# Patient Record
Sex: Female | Born: 1950
Health system: Southern US, Community
[De-identification: ages and names within clinical notes are randomized; demographics above are authoritative.]

## PROBLEM LIST (undated history)

## (undated) DIAGNOSIS — B009 Herpesviral infection, unspecified: Principal | ICD-10-CM

## (undated) DIAGNOSIS — E039 Hypothyroidism, unspecified: Secondary | ICD-10-CM

## (undated) DIAGNOSIS — T7840XA Allergy, unspecified, initial encounter: Secondary | ICD-10-CM

## (undated) DIAGNOSIS — M797 Fibromyalgia: Secondary | ICD-10-CM

## (undated) DIAGNOSIS — J45909 Unspecified asthma, uncomplicated: Secondary | ICD-10-CM

## (undated) DIAGNOSIS — N3010 Interstitial cystitis (chronic) without hematuria: Secondary | ICD-10-CM

## (undated) DIAGNOSIS — F419 Anxiety disorder, unspecified: Secondary | ICD-10-CM

## (undated) DIAGNOSIS — H269 Unspecified cataract: Secondary | ICD-10-CM

## (undated) DIAGNOSIS — E7800 Pure hypercholesterolemia, unspecified: Secondary | ICD-10-CM

## (undated) HISTORY — DX: Unspecified cataract: H26.9

## (undated) HISTORY — DX: Anxiety disorder, unspecified: F41.9

## (undated) HISTORY — DX: Herpesviral infection, unspecified: B00.9

## (undated) HISTORY — DX: Allergy, unspecified, initial encounter: T78.40XA

## (undated) HISTORY — DX: Fibromyalgia: M79.7

## (undated) HISTORY — DX: Hypothyroidism, unspecified: E03.9

## (undated) HISTORY — DX: Interstitial cystitis (chronic) without hematuria: N30.10

## (undated) HISTORY — DX: Pure hypercholesterolemia, unspecified: E78.00

## (undated) HISTORY — DX: Unspecified asthma, uncomplicated: J45.909

---

## 1995-06-15 HISTORY — PX: TOTAL ABDOMINAL HYSTERECTOMY: SHX209

## 1998-08-18 ENCOUNTER — Encounter: Payer: Self-pay | Admitting: Otolaryngology

## 1998-08-18 ENCOUNTER — Ambulatory Visit (HOSPITAL_COMMUNITY): Admission: RE | Admit: 1998-08-18 | Discharge: 1998-08-18 | Payer: Self-pay | Admitting: Otolaryngology

## 1999-11-17 ENCOUNTER — Encounter: Payer: Self-pay | Admitting: Obstetrics and Gynecology

## 1999-11-17 ENCOUNTER — Encounter: Admission: RE | Admit: 1999-11-17 | Discharge: 1999-11-17 | Payer: Self-pay | Admitting: Obstetrics and Gynecology

## 2000-07-27 ENCOUNTER — Ambulatory Visit (HOSPITAL_COMMUNITY): Admission: RE | Admit: 2000-07-27 | Discharge: 2000-07-27 | Payer: Self-pay | Admitting: Pulmonary Disease

## 2000-07-27 ENCOUNTER — Encounter: Payer: Self-pay | Admitting: Pulmonary Disease

## 2000-11-24 ENCOUNTER — Encounter: Payer: Self-pay | Admitting: Obstetrics and Gynecology

## 2000-11-24 ENCOUNTER — Encounter: Admission: RE | Admit: 2000-11-24 | Discharge: 2000-11-24 | Payer: Self-pay | Admitting: Obstetrics and Gynecology

## 2001-12-07 ENCOUNTER — Encounter: Payer: Self-pay | Admitting: Obstetrics and Gynecology

## 2001-12-07 ENCOUNTER — Encounter: Admission: RE | Admit: 2001-12-07 | Discharge: 2001-12-07 | Payer: Self-pay | Admitting: Obstetrics and Gynecology

## 2002-12-10 ENCOUNTER — Encounter: Payer: Self-pay | Admitting: Obstetrics and Gynecology

## 2002-12-10 ENCOUNTER — Encounter: Admission: RE | Admit: 2002-12-10 | Discharge: 2002-12-10 | Payer: Self-pay | Admitting: Obstetrics and Gynecology

## 2002-12-12 ENCOUNTER — Encounter: Admission: RE | Admit: 2002-12-12 | Discharge: 2002-12-12 | Payer: Self-pay | Admitting: Obstetrics and Gynecology

## 2002-12-12 ENCOUNTER — Encounter: Payer: Self-pay | Admitting: Obstetrics and Gynecology

## 2004-01-22 ENCOUNTER — Encounter: Admission: RE | Admit: 2004-01-22 | Discharge: 2004-01-22 | Payer: Self-pay | Admitting: Obstetrics and Gynecology

## 2004-01-27 ENCOUNTER — Encounter: Admission: RE | Admit: 2004-01-27 | Discharge: 2004-01-27 | Payer: Self-pay | Admitting: Obstetrics and Gynecology

## 2004-04-28 ENCOUNTER — Ambulatory Visit: Payer: Self-pay | Admitting: Adult Health

## 2005-02-12 ENCOUNTER — Encounter: Admission: RE | Admit: 2005-02-12 | Discharge: 2005-02-12 | Payer: Self-pay | Admitting: Obstetrics and Gynecology

## 2005-04-22 ENCOUNTER — Encounter: Admission: RE | Admit: 2005-04-22 | Discharge: 2005-04-22 | Payer: Self-pay | Admitting: Family Medicine

## 2005-07-16 ENCOUNTER — Ambulatory Visit: Payer: Self-pay | Admitting: Pulmonary Disease

## 2005-08-23 ENCOUNTER — Ambulatory Visit: Payer: Self-pay | Admitting: Pulmonary Disease

## 2005-08-24 ENCOUNTER — Encounter: Admission: RE | Admit: 2005-08-24 | Discharge: 2005-08-24 | Payer: Self-pay | Admitting: Obstetrics and Gynecology

## 2005-11-29 ENCOUNTER — Ambulatory Visit: Payer: Self-pay | Admitting: Pulmonary Disease

## 2006-01-25 ENCOUNTER — Ambulatory Visit: Payer: Self-pay | Admitting: Pulmonary Disease

## 2006-08-26 ENCOUNTER — Encounter: Admission: RE | Admit: 2006-08-26 | Discharge: 2006-08-26 | Payer: Self-pay | Admitting: Obstetrics and Gynecology

## 2006-09-08 ENCOUNTER — Ambulatory Visit: Payer: Self-pay | Admitting: Pulmonary Disease

## 2006-10-13 ENCOUNTER — Ambulatory Visit: Payer: Self-pay | Admitting: Pulmonary Disease

## 2006-10-24 ENCOUNTER — Ambulatory Visit: Payer: Self-pay | Admitting: Pulmonary Disease

## 2006-10-24 LAB — CONVERTED CEMR LAB
Albumin: 3.9 g/dL (ref 3.5–5.2)
Basophils Relative: 0.9 % (ref 0.0–1.0)
Bilirubin, Direct: 0.1 mg/dL (ref 0.0–0.3)
CO2: 31 meq/L (ref 19–32)
Cholesterol: 206 mg/dL (ref 0–200)
Direct LDL: 132.2 mg/dL
GFR calc Af Amer: 111 mL/min
Glucose, Bld: 94 mg/dL (ref 70–99)
HCT: 38.6 % (ref 36.0–46.0)
HDL: 50 mg/dL (ref >39.0)
MCHC: 33.7 g/dL (ref 30.0–36.0)
Monocytes Absolute: 0.4 10*3/uL (ref 0.2–0.7)
Neutrophils Relative %: 54.6 % (ref 43.0–77.0)
Platelets: 348 10*3/uL (ref 150–400)
RDW: 11.6 % (ref 11.5–14.6)
Sodium: 140 meq/L (ref 135–145)
Total Bilirubin: 0.5 mg/dL (ref 0.3–1.2)
Total CHOL/HDL Ratio: 4.1

## 2007-05-01 ENCOUNTER — Ambulatory Visit: Payer: Self-pay | Admitting: Pulmonary Disease

## 2007-08-01 ENCOUNTER — Ambulatory Visit: Payer: Self-pay | Admitting: Pulmonary Disease

## 2007-08-01 DIAGNOSIS — M797 Fibromyalgia: Secondary | ICD-10-CM

## 2007-08-01 DIAGNOSIS — F411 Generalized anxiety disorder: Secondary | ICD-10-CM | POA: Insufficient documentation

## 2007-08-01 DIAGNOSIS — J209 Acute bronchitis, unspecified: Secondary | ICD-10-CM

## 2007-08-01 DIAGNOSIS — N3010 Interstitial cystitis (chronic) without hematuria: Secondary | ICD-10-CM

## 2007-08-07 ENCOUNTER — Ambulatory Visit: Payer: Self-pay | Admitting: Pulmonary Disease

## 2007-08-18 ENCOUNTER — Telehealth (INDEPENDENT_AMBULATORY_CARE_PROVIDER_SITE_OTHER): Payer: Self-pay | Admitting: *Deleted

## 2007-08-18 ENCOUNTER — Ambulatory Visit: Payer: Self-pay | Admitting: Pulmonary Disease

## 2007-08-21 LAB — CONVERTED CEMR LAB
AST: 18 units/L (ref 0–37)
Albumin: 3.8 g/dL (ref 3.5–5.2)
Basophils Absolute: 0 10*3/uL (ref 0.0–0.1)
Bilirubin, Direct: 0.1 mg/dL (ref 0.0–0.3)
CO2: 34 meq/L — ABNORMAL HIGH (ref 19–32)
Chloride: 101 meq/L (ref 96–112)
Folate: >20 ng/mL
GFR calc Af Amer: 73 mL/min
GFR calc non Af Amer: 61 mL/min
Glucose, Bld: 110 mg/dL — ABNORMAL HIGH (ref 70–99)
Hemoglobin: 13.4 g/dL (ref 12.0–15.0)
MCHC: 32.5 g/dL (ref 30.0–36.0)
MCV: 93.7 fL (ref 78.0–100.0)
Potassium: 4 meq/L (ref 3.5–5.1)
RBC: 4.4 M/uL (ref 3.87–5.11)
RDW: 11.7 % (ref 11.5–14.6)
Sodium: 141 meq/L (ref 135–145)
Total Protein: 6.8 g/dL (ref 6.0–8.3)
Vitamin B-12: >1500 pg/mL — ABNORMAL HIGH (ref 211–911)
WBC: 6.7 10*3/uL (ref 4.5–10.5)

## 2007-08-25 ENCOUNTER — Ambulatory Visit: Payer: Self-pay | Admitting: Pulmonary Disease

## 2007-08-25 ENCOUNTER — Telehealth (INDEPENDENT_AMBULATORY_CARE_PROVIDER_SITE_OTHER): Payer: Self-pay | Admitting: *Deleted

## 2007-08-25 DIAGNOSIS — R9431 Abnormal electrocardiogram [ECG] [EKG]: Secondary | ICD-10-CM

## 2007-08-25 DIAGNOSIS — E039 Hypothyroidism, unspecified: Secondary | ICD-10-CM

## 2007-08-25 DIAGNOSIS — E7800 Pure hypercholesterolemia, unspecified: Secondary | ICD-10-CM

## 2007-12-19 ENCOUNTER — Ambulatory Visit (HOSPITAL_BASED_OUTPATIENT_CLINIC_OR_DEPARTMENT_OTHER): Admission: RE | Admit: 2007-12-19 | Discharge: 2007-12-19 | Payer: Self-pay | Admitting: Obstetrics and Gynecology

## 2007-12-25 ENCOUNTER — Encounter: Admission: RE | Admit: 2007-12-25 | Discharge: 2007-12-25 | Payer: Self-pay | Admitting: Obstetrics and Gynecology

## 2007-12-28 ENCOUNTER — Encounter: Admission: RE | Admit: 2007-12-28 | Discharge: 2007-12-28 | Payer: Self-pay | Admitting: Obstetrics and Gynecology

## 2008-04-05 ENCOUNTER — Ambulatory Visit: Payer: Self-pay | Admitting: Pulmonary Disease

## 2008-04-05 DIAGNOSIS — N63 Unspecified lump in unspecified breast: Secondary | ICD-10-CM | POA: Insufficient documentation

## 2008-04-15 ENCOUNTER — Encounter: Admission: RE | Admit: 2008-04-15 | Discharge: 2008-04-15 | Payer: Self-pay | Admitting: Pulmonary Disease

## 2008-08-13 ENCOUNTER — Telehealth: Payer: Self-pay | Admitting: Adult Health

## 2008-08-14 ENCOUNTER — Ambulatory Visit: Payer: Self-pay | Admitting: Pulmonary Disease

## 2008-09-05 ENCOUNTER — Ambulatory Visit: Payer: Self-pay | Admitting: Pulmonary Disease

## 2008-09-05 ENCOUNTER — Ambulatory Visit: Payer: Self-pay | Admitting: Diagnostic Radiology

## 2008-09-05 ENCOUNTER — Ambulatory Visit (HOSPITAL_BASED_OUTPATIENT_CLINIC_OR_DEPARTMENT_OTHER): Admission: RE | Admit: 2008-09-05 | Discharge: 2008-09-05 | Payer: Self-pay | Admitting: Critical Care Medicine

## 2008-11-26 ENCOUNTER — Ambulatory Visit: Payer: Self-pay | Admitting: Pulmonary Disease

## 2008-11-27 ENCOUNTER — Ambulatory Visit: Payer: Self-pay | Admitting: Pulmonary Disease

## 2008-11-27 LAB — CONVERTED CEMR LAB
Basophils Absolute: 0 10*3/uL (ref 0.0–0.1)
Basophils Relative: 0.3 % (ref 0.0–3.0)
Bilirubin Urine: NEGATIVE
Eosinophils Absolute: 0.1 10*3/uL (ref 0.0–0.7)
HCT: 39.4 % (ref 36.0–46.0)
HDL: 56.2 mg/dL (ref >39.00)
Hemoglobin, Urine: NEGATIVE
Hemoglobin: 13.5 g/dL (ref 12.0–15.0)
Leukocytes, UA: NEGATIVE
Lymphs Abs: 1.5 10*3/uL (ref 0.7–4.0)
MCHC: 34.3 g/dL (ref 30.0–36.0)
MCV: 93.2 fL (ref 78.0–100.0)
Monocytes Absolute: 0.4 10*3/uL (ref 0.1–1.0)
Neutrophils Relative %: 56.9 % (ref 43.0–77.0)
Potassium: 4.1 meq/L (ref 3.5–5.1)
RDW: 12.2 % (ref 11.5–14.6)
Sodium: 142 meq/L (ref 135–145)
Specific Gravity, Urine: 1.01 (ref 1.000–1.030)
TSH: 4.99 microintl units/mL (ref 0.35–5.50)
Total CHOL/HDL Ratio: 4
Urine Glucose: NEGATIVE mg/dL
VLDL: 19.8 mg/dL (ref 0.0–40.0)
pH: 8 (ref 5.0–8.0)

## 2008-12-10 ENCOUNTER — Telehealth (INDEPENDENT_AMBULATORY_CARE_PROVIDER_SITE_OTHER): Payer: Self-pay | Admitting: *Deleted

## 2008-12-27 ENCOUNTER — Telehealth (INDEPENDENT_AMBULATORY_CARE_PROVIDER_SITE_OTHER): Payer: Self-pay | Admitting: *Deleted

## 2009-01-14 ENCOUNTER — Encounter: Admission: RE | Admit: 2009-01-14 | Discharge: 2009-01-14 | Payer: Self-pay | Admitting: Obstetrics and Gynecology

## 2009-02-24 ENCOUNTER — Ambulatory Visit: Payer: Self-pay | Admitting: Pulmonary Disease

## 2009-03-12 ENCOUNTER — Telehealth: Payer: Self-pay | Admitting: Pulmonary Disease

## 2009-03-27 ENCOUNTER — Telehealth (INDEPENDENT_AMBULATORY_CARE_PROVIDER_SITE_OTHER): Payer: Self-pay | Admitting: *Deleted

## 2009-04-10 ENCOUNTER — Ambulatory Visit: Payer: Self-pay | Admitting: Pulmonary Disease

## 2009-04-23 ENCOUNTER — Ambulatory Visit: Payer: Self-pay | Admitting: Internal Medicine

## 2009-04-23 ENCOUNTER — Ambulatory Visit: Payer: Self-pay | Admitting: Pulmonary Disease

## 2009-04-25 ENCOUNTER — Telehealth: Payer: Self-pay | Admitting: Pulmonary Disease

## 2009-04-25 LAB — CONVERTED CEMR LAB
Basophils Absolute: 0 10*3/uL (ref 0.0–0.1)
Basophils Relative: 0.3 % (ref 0.0–3.0)
Chloride: 102 meq/L (ref 96–112)
Eosinophils Absolute: 0.1 10*3/uL (ref 0.0–0.7)
Eosinophils Relative: 1.4 % (ref 0.0–5.0)
GFR calc non Af Amer: 78.09 mL/min (ref >60)
Glucose, Bld: 104 mg/dL — ABNORMAL HIGH (ref 70–99)
Hemoglobin: 13.4 g/dL (ref 12.0–15.0)
Lymphs Abs: 1.6 10*3/uL (ref 0.7–4.0)
MCHC: 34.6 g/dL (ref 30.0–36.0)
Monocytes Absolute: 0.6 10*3/uL (ref 0.1–1.0)
Monocytes Relative: 10.4 % (ref 3.0–12.0)
Neutro Abs: 3.5 10*3/uL (ref 1.4–7.7)
Potassium: 4.1 meq/L (ref 3.5–5.1)
RDW: 12.5 % (ref 11.5–14.6)
Sodium: 139 meq/L (ref 135–145)
TSH: 2.99 microintl units/mL (ref 0.35–5.50)

## 2009-06-23 ENCOUNTER — Telehealth: Payer: Self-pay | Admitting: Pulmonary Disease

## 2009-07-01 ENCOUNTER — Ambulatory Visit: Payer: Self-pay | Admitting: Pulmonary Disease

## 2009-07-07 DIAGNOSIS — J309 Allergic rhinitis, unspecified: Secondary | ICD-10-CM | POA: Insufficient documentation

## 2009-07-28 ENCOUNTER — Ambulatory Visit: Payer: Self-pay | Admitting: Pulmonary Disease

## 2009-11-24 ENCOUNTER — Telehealth: Payer: Self-pay | Admitting: Pulmonary Disease

## 2009-11-25 ENCOUNTER — Ambulatory Visit: Payer: Self-pay | Admitting: Pulmonary Disease

## 2009-11-27 ENCOUNTER — Ambulatory Visit: Payer: Self-pay | Admitting: Pulmonary Disease

## 2009-11-29 LAB — CONVERTED CEMR LAB
ALT: 31 units/L (ref 0–35)
Albumin: 4.1 g/dL (ref 3.5–5.2)
Alkaline Phosphatase: 94 units/L (ref 39–117)
BUN: 24 mg/dL — ABNORMAL HIGH (ref 6–23)
Chloride: 105 meq/L (ref 96–112)
Cholesterol: 150 mg/dL (ref 0–200)
Eosinophils Absolute: 0.1 10*3/uL (ref 0.0–0.7)
Eosinophils Relative: 2.4 % (ref 0.0–5.0)
Hemoglobin: 12.9 g/dL (ref 12.0–15.0)
LDL Cholesterol: 78 mg/dL (ref 0–99)
MCHC: 34.3 g/dL (ref 30.0–36.0)
Neutro Abs: 3.2 10*3/uL (ref 1.4–7.7)
RDW: 13 % (ref 11.5–14.6)
Sodium: 143 meq/L (ref 135–145)
Total CHOL/HDL Ratio: 3
Total Protein: 6.7 g/dL (ref 6.0–8.3)
Vit D, 25-Hydroxy: 60 ng/mL (ref 30–89)

## 2010-01-26 ENCOUNTER — Encounter: Admission: RE | Admit: 2010-01-26 | Discharge: 2010-01-26 | Payer: Self-pay | Admitting: Obstetrics and Gynecology

## 2010-03-31 ENCOUNTER — Ambulatory Visit: Payer: Self-pay | Admitting: Pulmonary Disease

## 2010-04-29 ENCOUNTER — Telehealth (INDEPENDENT_AMBULATORY_CARE_PROVIDER_SITE_OTHER): Payer: Self-pay | Admitting: *Deleted

## 2010-04-30 ENCOUNTER — Ambulatory Visit: Payer: Self-pay | Admitting: Pulmonary Disease

## 2010-05-04 ENCOUNTER — Encounter: Admission: RE | Admit: 2010-05-04 | Discharge: 2010-05-04 | Payer: Self-pay | Admitting: Emergency Medicine

## 2010-05-08 ENCOUNTER — Telehealth (INDEPENDENT_AMBULATORY_CARE_PROVIDER_SITE_OTHER): Payer: Self-pay | Admitting: *Deleted

## 2010-07-04 ENCOUNTER — Encounter: Payer: Self-pay | Admitting: Obstetrics and Gynecology

## 2010-07-14 NOTE — Assessment & Plan Note (Signed)
Summary: lump in throat/ mbw   CC:  states she is feeling a lump in throat when swollowing x2wks..  History of Present Illness: The patient is a 60 -year-old white female patient of Dr. Jodelle Green who has a known history of mild asthmatic bronchitis, fibromyalgia,  2/17 seen w/ cough and green sputum... dx w/ AB, and rx w/ Omnicef & Mucinex... 2/23 seen w/ ST and nasal congestion... dx w/ sinusitis, and rx w/ more Omnicef, Pred, Veramist, etc. 3/6 seen w/ green tongue... and given Nystatin, yogurt etc...    April 05, 2008 -- Complains of sore throat, chest congestion, green sputum x4 days. Also noticed a small knot in right breast, Had mammogram and [place] in 7/09 which was neg per pt. --Mammorgram/US showed several small cysts.   August 14, 2008 --Presents for an acute office visit. Pt c/o productive cough with green phlegm and head congestion x5 days.  Throat sore-took MMW. Head very stuffy. Feel bad all over. Subjective fever. -took tylenol.  . Avelox is the only antibiotics that has worked for me. She has several events coming up.   September 05, 2008--Pt returns today w/ 2 days of nasal drainage, sore throat, hoarseness, cough, low grade temp ~98.1 (per pt). Cough mild white to yellow. Last visit w/ similar symptoms-resolved nicely w/ Avelox. Head feels lightheaded at times, no ear pain,     ~  November 27, 2008:  last seen by me 3/09 w/ refractory AB & responded to Medrol, Avelox, MMW, fluids, etc... she has seen TP on severe occas w/ sore throats, bronchitic infections, etc... overall doing reasonably well and here for a CPX today... she denies CP/ palpit/ SOB/ dizzy/ syncope/ edema/ etc...  February 24, 2009 --Presents for an acute office visit. Complains of sore throat, prod cough with thick greenish mucus, wheezing, increased SOB, low grade temp x3weeks - has been treating with mucinex without much help.  04/10/09--Presents for an acute office visit. Complains that symtpoms continue to persist w/  nasal congesiton, cough, soreness in ribs with coughing, Gets some better but not completely gone, finished zpack 2 weeks ago. Denies chest pain, dyspnea, orthopnea, hemoptysis, fever, n/v/d, edema, headache .   April 25, 2009--Presents for an acute office visit. She complains that her symptoms never went totally away, still very tired and no energy. still tightness in chest, prod cough with green mucus, f/c/s, finished pred taper Friday and still feels nervous. Feels run down. muscle aches are worse. Denies chest pain, orthopnea, hemoptysis, fever, n/v/d, edema, headache.   July 01, 2009--Presents for work in visit. Complains of states she is feeling a lump in throat when swollowing x2wks.  Some nasal drainage. Feels tickle in throat. Has intermittent reflux and burping.. Denies chest pain, dyspnea, orthopnea, hemoptysis, fever, n/v/d, edema, headache,discolored drainage.   Current Medications (verified): 1)  Bayer Low Strength 81 Mg  Tbec (Aspirin) .... Take 1 By Mouth Once Daily 2)  Synthroid 50 Mcg  Tabs (Levothyroxine Sodium) .... Take 1 By Mouth Once Daily 3)  Estradiol 1 Mg  Tabs (Estradiol) .... Take /21 Tablet By Mouth Once A Day 4)  Calcium 600 600 Mg  Tabs (Calcium Carbonate) .... 3 By Mouth Once Daily 5)  Womens Multivitamin Plus  Tabs (Multiple Vitamins-Minerals) .... Take 1 Tab By Mouth Once Daily.Marland KitchenMarland Kitchen 6)  Vitamin C 500 Mg  Tabs (Ascorbic Acid) .... Take 1 By Mouth Once Daily 7)  Vitamin D 1000 Unit Caps (Cholecalciferol) .... Take 1 Cap By Mouth Once Daily.Marland KitchenMarland Kitchen  8)  Vitamin E 400 Unit  Caps (Vitamin E) .... Take 1 By Mouth Once Daily 9)  Cyclobenzaprine Hcl 10 Mg  Tabs (Cyclobenzaprine Hcl) .... Take 1 Tablet By Mouth Once A Day 10)  Crestor 10 Mg Tabs (Rosuvastatin Calcium) .... Take 1/2 Tab By Mouth At Bedtime 11)  Coq-10 100 Mg Caps (Coenzyme Q10) .... Take 1 Capsule By Mouth Once A Day 12)  Mucinex Maximum Strength 1200 Mg Xr12h-Tab (Guaifenesin) .Marland Kitchen.. 1-2 Every 12 Hours As  Needed 13)  Tessalon 200 Mg Caps (Benzonatate) .Marland Kitchen.. 1 By Mouth Three Times A Day As Needed Cough  Allergies (verified): 1)  ! Iodine 2)  ! Sulfa  Past History:  Past Medical History: Last updated: 25-Dec-2008 ASTHMATIC BRONCHITIS, ACUTE (ICD-466.0) Hx of ABNORMAL ELECTROCARDIOGRAM (ICD-794.31) HYPERCHOLESTEROLEMIA (ICD-272.0) HYPOTHYROIDISM (ICD-244.9) INTERSTITIAL CYSTITIS (ICD-595.1) FIBROMYALGIA (ICD-729.1) ANXIETY (ICD-300.00)  Family History: Last updated: 12/25/08 Father died age 60 w/ Alzheimers, stroke, CHF Mother died age 71 w/ bronchiectasis, pneumonia, CHF 2 Siblings: Brother died w/ islet cell pancreatic cancer Sister, [name], has bronchiectasis & hyperchol  Social History: Last updated: 2008/12/25 Divorced 3 Children- 39, 35, 25 Never smoked Social alcohol Engineer, site- substitute teaching now plays piano, 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 Is Patient Diabetic? No Comments Medications reviewed with patient phone number verified with pt Gweneth Dimitri RN  July 01, 2009 3:31 PM    Physical Exam  Additional Exam:   GEN: A/Ox3; pleasant , NAD HEENT:  [place]/AT, , EACs-clear, TMs-nml  NOSE-pale, w/ clear d/ch, THROAT-clear NECK:  Supple w/ fair ROM; no JVD; normal carotid impulses w/o bruits; no thyromegaly or nodules palpated; no lymphadenopathy. RESP  Clear to P & A; w/o, wheezes/ rales/ or rhonchi. CARD:  RRR, no  m/r/g   GI:   Soft & nt; nml bowel sounds; no organomegaly or masses detected. Muscu: Warm bil,  no calf tenderness edema, clubbing, pulses intact Neuro:  A/Ox3 no focal deficits     Impression & Recommendations:  Problem # 1:  ALLERGIC RHINITIS (ICD-477.9)  Flare REC:  Zyrtec 10mg  at bedtime for 2 weeks  Saline nasal rinses as needed  Mucinex DM two times a day as needed cough Sugarless candy, NO MINTS, to help stop throat clearing or coughing.  Prilosec 20mg  once daily for 2 weeks  Please contact office for sooner follow up if symptoms do not improve or worsen  The following medications were removed from the medication list:    Veramyst 27.5 Mcg/spray Susp (Fluticasone furoate) .Marland Kitchen... 1 spray each nostril two times a day    Zyrtec Allergy 10 Mg Tabs (Cetirizine hcl) .Marland Kitchen... Take 1 tab by mouth at bedtime x1week then as needed  Orders: Est. Patient Level III (16109)  Medications Added to Medication  List This Visit: 1)  Estradiol 1 Mg Tabs (Estradiol) .... Take /21 tablet by mouth once a day 2)  Alprazolam 0.25 Mg Tabs (Alprazolam) .... 1/2 to 1 once daily as needed anxiety  Complete Medication List: 1)  Bayer Low Strength 81 Mg Tbec (Aspirin) .... Take 1 by mouth once daily 2)  Synthroid 50 Mcg Tabs (Levothyroxine sodium) .... Take 1 by mouth once daily 3)  Estradiol 1 Mg Tabs (Estradiol) .... Take /21 tablet by mouth once a day 4)  Calcium 600 600 Mg Tabs (Calcium carbonate) .... 3 by mouth once daily 5)  Womens Multivitamin Plus Tabs (Multiple vitamins-minerals) .... Take 1 tab by mouth once daily.Marland KitchenMarland Kitchen 6)  Vitamin C 500 Mg Tabs (Ascorbic acid) .... Take 1 by mouth once daily 7)  Vitamin D 1000 Unit Caps (Cholecalciferol) .... Take 1 cap by mouth once daily.Marland KitchenMarland Kitchen 8)  Vitamin E 400 Unit Caps (Vitamin e) .... Take 1 by mouth once daily 9)  Cyclobenzaprine Hcl 10 Mg Tabs (Cyclobenzaprine hcl) .... Take 1 tablet by mouth once a day 10)  Crestor 10 Mg Tabs (Rosuvastatin calcium) .... Take  1/2 tab by mouth at bedtime 11)  Coq-10 100 Mg Caps (Coenzyme q10) .... Take 1 capsule by mouth once a day 12)  Mucinex Maximum Strength 1200 Mg Xr12h-tab (Guaifenesin) .Marland Kitchen.. 1-2 every 12 hours as needed 13)  Tessalon 200 Mg Caps (Benzonatate) .Marland Kitchen.. 1 by mouth three times a day as needed cough 14)  Alprazolam 0.25 Mg Tabs (Alprazolam) .... 1/2 to 1 once daily as needed anxiety  Patient Instructions: 1)  Zyrtec 10mg  at bedtime for 2 weeks  2)  Saline nasal rinses as needed  3)  Mucinex DM two times a day as needed cough 4)  Sugarless candy, NO MINTS, to help stop throat clearing or coughing.  5)  Prilosec 20mg  once daily for 2 weeks  6)  Please contact office for sooner follow up if symptoms do not improve or worsen  Prescriptions: ALPRAZOLAM 0.25 MG TABS (ALPRAZOLAM) 1/2 to 1 once daily as needed anxiety  #30 x 0   Entered and Authorized by:   Rubye Oaks NP   Signed by:   Angla Delahunt NP on 07/01/2009   Method used:   Print then Give to Patient   RxID:   [id number]

## 2010-07-14 NOTE — Progress Notes (Signed)
Summary: LEG CRAMPS  Phone Note Call from Patient   Caller: Patient Call For: Dessirae Scarola Summary of Call: CRESTOR IS CAUSING LEG CRAMP Initial call taken by: Rickard Patience,  June 23, 2009 11:17 AM  Follow-up for Phone Call        Pt c/o leg cramps since taking crestor. Pt was instructed to decrease crestor dose by half and add Coenzyme, but pt states her legs are still "killing her." Please advise. Carron Curie CMA  June 23, 2009 11:23 AM   Additional Follow-up for Phone Call Additional follow up Details #1::        per SN----if this is the problem then her only option is to stop the crestor.  thanks Randell Loop CMA  June 23, 2009 2:52 PM     Additional Follow-up for Phone Call Additional follow up Details #2::    PT aware of recs from SN.Reynaldo Minium CMA  June 23, 2009 3:11 PM

## 2010-07-14 NOTE — Assessment & Plan Note (Signed)
Summary: possible bronchitis/jd   CC:  cough and congestion for 1 week. Marland Kitchen  History of Present Illness: The patient is a 60 -year-old white female patient of Dr. Jodelle Green who has a known history of mild asthmatic bronchitis, fibromyalgia.      Jun10:  last seen by me 3/09 w/ refractory AB & responded to Medrol, Avelox, MMW, fluids, etc... she has seen TP on severe occas w/ sore throats, bronchitic infections, etc... overall doing reasonably well and here for a CPX today... she denies CP/ palpit/ SOB/ dizzy/ syncope/ edema/ etc...   ~  July 28, 2009:  she was seen by TP on several occas for upper resp tract infections and AB requiring Pred, Avelox, Mucinex, etc...  presents today w/ similar URI symptoms after exposure to grand daughter w/ the flu... pt c/o cough, congestion, feeling poorly, temp to 99.6 - all over the past 3-4 d... she states symptoms persist despite Mucinex & fluids, & that ZPak doesn't work for her- wants Avelox...   ~  November 27, 2009:  "I'm doing good" she says and credits the improvement a an immune-enhancer from the health food store- ASTRALAGUS- "it's an herb that helps the lungs & immune system" & notes no further AB exac etc... her Chol is OK on the Crestor & CoQ10;  Thyroid controlled on the Synthroid50;  bladder symptoms stable;  & FM/ Anxiety controlled w/ Flex & Alpraz...   March 31, 2010 --Presents for an acute office visit. Complains of 1 week of cough, congestion with green mucus, lots of drainage in throat, fever. OTC not helping. Feels run down. Hoarseness is starting.  Denies chest pain, dyspnea, orthopnea, hemoptysis, n/v/d, edema, headache,recent travel or antibiotics.    1   Preventive Screening-Counseling & Management  Alcohol-Tobacco     Smoking Status: never  Medications Prior to Update: 1)  Bayer Low Strength 81 Mg  Tbec (Aspirin) .... Take 1 By Mouth Once Daily 2)  Crestor 10 Mg Tabs (Rosuvastatin Calcium) .... Take 1/2 Tab By Mouth At  Bedtime 3)  Coq-10 100 Mg Caps (Coenzyme Q10) .... Take 1 Capsule By Mouth Once A Day 4)  Synthroid 50 Mcg  Tabs (Levothyroxine Sodium) .... Take 1 By Mouth Once Daily 5)  Estradiol 1 Mg  Tabs (Estradiol) .... Take /21 Tablet By Mouth Qod 6)  Calcium 600 600 Mg  Tabs (Calcium Carbonate) .... 3 By Mouth Once Daily 7)  Womens Multivitamin Plus  Tabs (Multiple Vitamins-Minerals) .... Take 1 Tab By Mouth Once Daily.Marland KitchenMarland Kitchen 8)  Vitamin C 500 Mg  Tabs (Ascorbic Acid) .... Take 1 By Mouth Once Daily 9)  Vitamin D 1000 Unit Caps (Cholecalciferol) .... Take 1 Cap By Mouth Once Daily... 10)  Vitamin E 400 Unit  Caps (Vitamin E) .... Take 1 By Mouth Once Daily 11)  Cyclobenzaprine Hcl 10 Mg  Tabs (Cyclobenzaprine Hcl) .... Take 1 Tablet By Mouth Once A Day 12)  Alprazolam 0.25 Mg Tabs (Alprazolam) .... 1/2 To 1 Once Daily As Needed Anxiety  Current Medications (verified): 1)  Bayer Low Strength 81 Mg  Tbec (Aspirin) .... Take 1 By Mouth Once Daily 2)  Crestor 10 Mg Tabs (Rosuvastatin Calcium) .... Take 1/2 Tab By Mouth At Bedtime 3)  Coq-10 100 Mg Caps (Coenzyme Q10) .... Take 1 Capsule By Mouth Once A Day 4)  Synthroid 50 Mcg  Tabs (Levothyroxine Sodium) .... Take 1 By Mouth Once Daily 5)  Estradiol 1 Mg  Tabs (Estradiol) .... Take 1/2 Tablet By Mouth  Every Other Day 6)  Calcium 600 600 Mg  Tabs (Calcium Carbonate) .... 3 By Mouth Once Daily 7)  Womens Multivitamin Plus  Tabs (Multiple Vitamins-Minerals) .... Take 1 Tab By Mouth Once Daily.Marland KitchenMarland Kitchen 8)  Vitamin C 500 Mg  Tabs (Ascorbic Acid) .... Take 1 By Mouth Once Daily 9)  Vitamin D 1000 Unit Caps (Cholecalciferol) .... Take 1 Cap By Mouth Once Daily... 10)  Vitamin E 400 Unit  Caps (Vitamin E) .... Take 1 By Mouth Once Daily 11)  Cyclobenzaprine Hcl 10 Mg  Tabs (Cyclobenzaprine Hcl) .... Take 1 Tablet By Mouth Once A Day 12)  Alprazolam 0.25 Mg Tabs (Alprazolam) .... 1/2 To 1 Once Daily As Needed Anxiety  Allergies (verified): 1)  ! Iodine 2)  !  Sulfa  Past History:  Past Medical History: Last updated: 12-14-09 ASTHMATIC BRONCHITIS, ACUTE (ICD-466.0) Hx of ABNORMAL ELECTROCARDIOGRAM (ICD-794.31) HYPERCHOLESTEROLEMIA (ICD-272.0) HYPOTHYROIDISM (ICD-244.9) INTERSTITIAL CYSTITIS (ICD-595.1) FIBROMYALGIA (ICD-729.1) ANXIETY (ICD-300.00)  Past Surgical History: Last updated: December 14, 2009 hysterectomy 1997  Family History: Last updated: Dec 14, 2009 Father died age 22 w/ Alzheimers, stroke, CHF Mother died age 53 w/ bronchiectasis, pneumonia, CHF 2 Siblings: Brother died w/ islet cell pancreatic cancer Sister, [name], has bronchiectasis & hyperchol  Social History: Last updated: 2009/12/14 Divorced 3 Children- 39, 35, 25 Never smoked Social alcohol Engineer, site- substitute teaching now plays piano, 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  Is Patient Diabetic? No Comments Medications reviewed with patient Verified contact number and pharmacy with patient Zackery Barefoot CMA  March 31, 2010 3:43 PM    Physical Exam  Additional Exam:   GEN: A/Ox3; pleasant , NAD HEENT:  [place]/AT, , EACs-clear, TMs-nml  NOSE-pale, w/ clear d/ch, THROAT-clear NECK:  Supple w/ fair ROM; no JVD; normal carotid impulses w/o bruits; no thyromegaly or nodules palpated; no lymphadenopathy. RESP  Clear to P & A; w/o, wheezes/ rales/ or rhonchi. CARD:  RRR, no m/r/g   GI:   Soft & nt; nml bowel sounds; no organomegaly or masses detected. Muscu: Warm bil,  no calf tenderness edema, clubbing, pulses intact Neuro:  A/Ox3 no focal  deficits     Impression & Recommendations:  Problem # 1:  ASTHMATIC BRONCHITIS, ACUTE (ICD-466.0)  Plan :  Avelox 400mg  once daily for 7 days Zyrtec 10mg  at bedtime for 2 weeks  Saline nasal rinses as needed  Mucinex DM two times a day as needed cough Sugarless candy, NO MINTS, to help stop throat clearing or coughing.  Prilosec 20mg  once daily for 2 weeks  Please contact office for sooner follow up if symptoms do not improve or worsen  Her updated medication list for this problem includes:    Avelox 400 Mg Tabs (Moxifloxacin hcl) .Marland Kitchen... 1 by mouth once daily  Orders: Est. Patient Level IV (57846)  Medications Added to Medication List This Visit: 1)  Estradiol 1  Mg Tabs (Estradiol) .... Take 1/2 tablet by mouth every other day 2)  Avelox 400 Mg Tabs (Moxifloxacin hcl) .Marland Kitchen.. 1 by mouth once daily 3)  Majic Mouthwash  .... 1 tsp four times a day as needed sore throat , swish and swallow  Complete Medication List: 1)  Bayer Low Strength 81 Mg Tbec (Aspirin) .... Take 1 by mouth once daily 2)  Crestor 10 Mg Tabs (Rosuvastatin calcium) .... Take 1/2 tab by mouth at bedtime 3)  Coq-10 100 Mg Caps (Coenzyme q10) .... Take 1 capsule by mouth once a day 4)  Synthroid 50 Mcg Tabs (Levothyroxine sodium) .... Take 1 by mouth once daily 5)  Estradiol 1 Mg Tabs (Estradiol) .... Take 1/2 tablet by mouth every other day 6)  Calcium 600 600 Mg Tabs (Calcium carbonate) .... 3 by mouth once daily 7)  Womens Multivitamin Plus Tabs (Multiple vitamins-minerals) .... Take 1 tab by mouth once daily.Marland KitchenMarland Kitchen 8)  Vitamin C 500 Mg Tabs (Ascorbic acid) .... Take 1 by mouth once daily 9)  Vitamin D 1000 Unit Caps (Cholecalciferol) .... Take 1 cap by mouth once daily... 10)  Vitamin E 400 Unit Caps (Vitamin e) .... Take 1 by mouth once daily 11)  Cyclobenzaprine Hcl 10 Mg Tabs (Cyclobenzaprine hcl) .... Take 1 tablet by mouth once a day 12)  Alprazolam 0.25 Mg Tabs (Alprazolam) .... 1/2 to 1 once daily as needed  anxiety 13)  Avelox 400 Mg Tabs (Moxifloxacin hcl) .Marland Kitchen.. 1 by mouth once daily 14)  Majic Mouthwash  .... 1 tsp four times a day as needed sore throat , swish and swallow  Patient Instructions: 1)  Avelox 400mg  once daily for 7 days 2)  Zyrtec 10mg  at bedtime for 2 weeks  3)  Saline nasal rinses as needed  4)  Mucinex DM two times a day as needed cough 5)  Sugarless candy, NO MINTS, to help stop throat clearing or coughing.  6)  Prilosec 20mg  once daily for 2 weeks  7)  Please contact office for sooner follow up if symptoms do not improve or worsen  Prescriptions: MAJIC MOUTHWASH 1 tsp four times a day as needed sore throat , swish and swallow  #8 oz x 0   Entered and Authorized by:   Rubye Oaks NP   Signed by:   Tammy Parrett NP on 03/31/2010   Method used:   Print then Give to Patient   RxID:   [id number] AVELOX 400 MG TABS (MOXIFLOXACIN HCL) 1 by mouth once daily  #7 x 0   Entered and Authorized by:   Rubye Oaks NP   Signed by:   Tammy Parrett NP on 03/31/2010   Method used:   Electronically to        [facility]* (retail)       [address]       [place], [place]  [address]       Ph: [phone]       Fax: [phone]   RxID:   [id number]

## 2010-07-14 NOTE — Assessment & Plan Note (Signed)
Summary: cpx/ mbw   CC:  4 month ROV & annual CPX....  History of Present Illness: 60 y/o WF here for a follow up visit & annual CPX...   ~  Jun10:  last seen by me 3/09 w/ refractory AB & responded to Medrol, Avelox, MMW, fluids, etc... she has seen TP on severe occas w/ sore throats, bronchitic infections, etc... overall doing reasonably well and here for a CPX today... she denies CP/ palpit/ SOB/ dizzy/ syncope/ edema/ etc...   ~  July 28, 2009:  she was seen by TP on several occas for upper resp tract infections and AB requiring Pred, Avelox, Mucinex, etc...  presents today w/ similar URI symptoms after exposure to grand daughter w/ the flu... pt c/o cough, congestion, feeling poorly, temp to 99.6 - all over the past 3-4 d... she states symptoms persist despite Mucinex & fluids, & that ZPak doesn't work for her- wants Avelox...   ~  November 27, 2009:  "I'm doing good" she says and credits the improvement a an immune-enhancer from the health food store- ASTRALAGUS- "it's an herb that helps the lungs & immune system" & notes no further AB exac etc... her Chol is OK on the Crestor & CoQ10;  Thyroid controlled on the Synthroid50;  bladder symptoms stable;  & FM/ Anxiety controlled w/ Flex & Alpraz...    Current Problems:   ASTHMATIC BRONCHITIS, ACUTE (ICD-466.0) - she gets 1-2 bouts of bronchitis yearly & they are "hard to shake"- requiring Medrol, Antibiotics, Mucinex, etc... prev doing well - w/o cough, sputum, hemoptysis, worsening dyspnea, wheezing, chest pains, snoring, daytime hypersomnolence, etc...   ~  6/11:  she credits improvement on an herbal supplement= Astralagus.  Hx of ABNORMAL ELECTROCARDIOGRAM (ICD-794.31) - seen by Jaclyn Bennett in 2001... she is asymptomatic and denies CP, palipit, dizziness, syncope, dyspnea, edema, etc...   ~  baseline EKG w/ poor R progression V1-3, diffuse SSTTWA...   ~  2DEcho 8/01 w/ mild ant wall HK, norm EF, valves normal...  ~  Cardiolite 8/01 showed  norm perfusion, no ischemia, EF=61%...  ~  f/u EKG remains abnormal> no change.  HYPERCHOLESTEROLEMIA (ICD-272.0) - treated w/ diet + CRESTOR 10mg - 1/2 daily + CoQ10 100mg /d...  ~  TChol as high as 228, LDL up to 160 in 2007  ~  FLP 5/08 showed TChol 206, TG 62, HDL 50, LDL 132  ~  FLP 6/10 showed TChol 225, TG 99, HDL 56, LDL 157... rec> statin Rx- she initially declined but agreed to try Crestor5mg  w/ CoQ10...  ~  FLP 6/11 showed TChol 150, TG 70, HDL 58, LDL 78  HYPOTHYROIDISM (ICD-244.9) - on SYNTHROID 60mcg/d... clinically euthyroid...  ~  TSH as high as 6.35 in 2007, started on Rx...   ~  TSH 5/08 = 4.75 on Rx...  ~  labs 6/10 showed TSH= 4.99  ~  labs 6/11 showed TSH= 3.46  *** she has repeatedly refused screening colonoscopy... discussed again & she is requested to sched this important screening procedure.  INTERSTITIAL CYSTITIS (ICD-595.1) - followed by Jaclyn Bennett & she describes 2 problems- small bladder, & not emptying completely  FIBROMYALGIA (ICD-729.1) - takes FLEXERIL 10mg Qhs...  ANXIETY (ICD-300.00) w/ hx claustraphobia & uses ALPRAZOLAM 0.25mg  Tid Prn...  HEALTH MAINTENANCE:  prev seen by Jaclyn Bennett for GYN.Marland Kitchen.  now she sees Jaclyn Bennett- yearly f/u & weaning Estradiol... [facility]/ [facility] on son[facility] last yr & f/u due soon...  she had BMD done for baseline= normal  by her report (mother had osteoporosis & she "doesn't want to get that")... she has repeatedly refused colonoscopy & refuses once again today despite long conversation about the importance of this screening procedure...    Current Medications (verified): 1)  Bayer Low Strength 81 Mg  Tbec (Aspirin) .... Take 1 By Mouth Once Daily 2)  Crestor 10 Mg Tabs (Rosuvastatin Calcium) .... Take 1/2 Tab By Mouth At Bedtime 3)  Coq-10 100 Mg Caps (Coenzyme Q10) .... Take 1 Capsule By Mouth Once A Day 4)  Synthroid 50 Mcg  Tabs (Levothyroxine Sodium) .... Take 1 By Mouth Once Daily 5)   Estradiol 1 Mg  Tabs (Estradiol) .... Take /21 Tablet By Mouth Qod 6)  Calcium 600 600 Mg  Tabs (Calcium Carbonate) .... 3 By Mouth Once Daily 7)  Womens Multivitamin Plus  Tabs (Multiple Vitamins-Minerals) .... Take 1 Tab By Mouth Once Daily.Marland KitchenMarland Kitchen 8)  Vitamin C 500 Mg  Tabs (Ascorbic Acid) .... Take 1 By Mouth Once Daily 9)  Vitamin D 1000 Unit Caps (Cholecalciferol) .... Take 1 Cap By Mouth Once Daily... 10)  Vitamin E 400 Unit  Caps (Vitamin E) .... Take 1 By Mouth Once Daily 11)  Cyclobenzaprine Hcl 10 Mg  Tabs (Cyclobenzaprine Hcl) .... Take 1 Tablet By Mouth Once A Day 12)  Alprazolam 0.25 Mg Tabs (Alprazolam) .... 1/2 To 1 Once Daily As Needed Anxiety 13)  Avelox 400 Mg Tabs (Moxifloxacin Hcl) .... Take 1 Tab By Mouth Once Daily Til Gone...  Allergies: 1)  ! Iodine 2)  ! Sulfa  Past History:  Past Medical History: ASTHMATIC BRONCHITIS, ACUTE (ICD-466.0) Hx of ABNORMAL ELECTROCARDIOGRAM (ICD-794.31) HYPERCHOLESTEROLEMIA (ICD-272.0) HYPOTHYROIDISM (ICD-244.9) INTERSTITIAL CYSTITIS (ICD-595.1) FIBROMYALGIA (ICD-729.1) ANXIETY (ICD-300.00)  Past Surgical History: hysterectomy 1997  Family History: Reviewed history from 11/27/2008 and no changes required. Father died age 28 w/ Alzheimers, stroke, CHF Mother died age 66 w/ bronchiectasis, pneumonia, CHF 2 Siblings: Brother died w/ islet cell pancreatic cancer Sister, [name], has bronchiectasis & hyperchol  Social History: Reviewed history from 11/27/2008 and no changes required. 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  The patient denies fever, chills, sweats, anorexia, fatigue, weakness, weight loss, sleep disorder, blurring, diplopia, eye irritation, eye discharge,  vision loss, eye pain, photophobia, earache, ear discharge, tinnitus, decreased hearing, nasal congestion, nosebleeds, sore throat, hoarseness, chest pain, palpitations, syncope, orthopnea, PND, peripheral edema, cough, dyspnea at rest, excessive sputum, hemoptysis, wheezing, pleurisy, nausea, vomiting, diarrhea, constipation, change in bowel habits, abdominal pain, melena, hematochezia, jaundice, indigestion/heartburn, dysphagia, odynophagia, dysuria, hematuria, urinary hesitancy, nocturia, back pain, joint pain, joint swelling, muscle weakness, stiffness, arthritis, sciatica, restless legs, leg pain at night, leg pain with exertion, rash, itching, dryness, suspicious lesions, paralysis, paresthesias, seizures, tremors, vertigo, transient blindness, frequent falls, frequent headaches, difficulty walking, depression, memory loss, confusion, cold intolerance, heat intolerance, polydipsia, polyphagia, polyuria, unusual weight change, abnormal bruising, bleeding, enlarged lymph nodes, urticaria, allergic rash, hay fever, and recurrent infections.    Vital Signs:  Patient profile:   60 year old female Height:      63 inches Weight:      114 pounds O2 Sat:      100 % on Room air Temp:     97.7 degrees F oral Pulse rate:   87 /  minute BP sitting:   112 / 68  (left arm) Cuff size:   regular  Vitals Entered By: Denna Haggard, CMA (November 27, 2009 11:26 AM)  O2 Flow:  Room air  Physical Exam  Additional Exam:  WD, WN, 60 y/o WF in NAD... GENERAL:  Alert & oriented; pleasant & cooperative... HEENT:  [place]/AT, EOM-wnl, PERRLA, Fundi-benign, EACs-clear, TMs-wnl, NOSE-sl red, THROAT-clear & wnl. NECK:  Supple w/ sl decrROM; no JVD; normal carotid impulses w/o bruits; no thyromegaly or nodules palpated; no lymphadenopathy. CHEST:  Clear to P & A; without wheezes/ rales/ or rhonchi. HEART:  Regular Rhythm; without murmurs/ rubs/ or gallops. ABDOMEN:  Soft & nontender; normal bowel sounds; no organomegaly  or masses detected. EXT: without deformities or arthritic changes; no varicose veins/ venous insuffic/ or edema. +trigger points bilat... NEURO:  CN's intact; motor testing normal; sensory testing normal; gait normal & balance OK. DERM:  No lesions noted; no rash etc...    EKG  Procedure date:  11/27/2009  Findings:      Normal sinus rhythm with rate of:  82/min... Tracing shows sl IVCD, diffuse STTWA, no change from old tracings... SN   MISC. Report  Procedure date:  11/25/2009  Findings:      Lipid Panel (LIPID)   Cholesterol               150 mg/dL                   1-610   Triglycerides             70.0 mg/dL                  9.6-045.4   HDL                       09.81 mg/dL                 >19.14   LDL Cholesterol           78 mg/dL                    7-82  CBC Platelet w/Diff (CBCD)   White Cell Count          5.7 K/uL                    4.5-10.5   Red Cell Count            4.05 Mil/uL                 3.87-5.11   Hemoglobin                12.9 g/dL                   95.6-21.3   Hematocrit                37.7 %                      36.0-46.0   MCV                       93.1 fl                     78.0-100.0   Platelet Count            284.0 K/uL  150.0-400.0   Neutrophil %              55.8 %                      43.0-77.0   Lymphocyte %              31.8 %                      12.0-46.0   Monocyte %                9.4 %                       3.0-12.0   Eosinophils%              2.4 %                       0.0-5.0   Basophils %               0.6 %                       0.0-3.0  Comments:      BMP (METABOL)   Sodium                    143 mEq/L                   135-145   Potassium                 4.0 mEq/L                   3.5-5.1   Chloride                  105 mEq/L                   96-112   Carbon Dioxide            31 mEq/L                    19-32   Glucose                   96 mg/dL                    16-10   BUN                  [H]  24  mg/dL                    9-60   Creatinine                0.9 mg/dL                   4.5-4.0   Calcium                   9.1 mg/dL                   9.8-11.9   GFR                       72.67 mL/min                >60  Hepatic/Liver Function Panel (HEPATIC)  Total Bilirubin           0.5 mg/dL                   2.9-5.6   Direct Bilirubin          0.1 mg/dL                   2.1-3.0   Alkaline Phosphatase      94 U/L                      39-117   AST                       29 U/L                      0-37   ALT                       31 U/L                      0-35   Total Protein             6.7 g/dL                    8.6-5.7   Albumin                   4.1 g/dL                    8.4-6.9  TSH (TSH)   FastTSH                   3.46 uIU/mL                 0.35-5.50  Vitamin D (25-Hydroxy) (62952)  Vitamin D (25-Hydroxy)                             60 ng/mL                    30-89   Impression & Recommendations:  Problem # 1:  PHYSICAL EXAMINATION (ICD-V70.0)  Orders: 12 Lead EKG (12 Lead EKG)  Problem # 2:  ASTHMATIC BRONCHITIS, ACUTE (ICD-466.0) Doing well w/o exac since she's started the herb ASTRALAGUS... The following medications were removed from the medication list:    Mucinex Maximum Strength 1200 Mg Xr12h-tab (Guaifenesin) .Marland Kitchen... 1-2 every 12 hours as needed    Tessalon 200 Mg Caps (Benzonatate) .Marland Kitchen... 1 by mouth three times a day as needed cough    Avelox 400 Mg Tabs (Moxifloxacin hcl) .Marland Kitchen... Take 1 tab by mouth once daily til gone...  Problem # 3:  Hx of ABNORMAL ELECTROCARDIOGRAM (ICD-794.31) EKG w/o change and she is essentially asymptomatic...  Problem # 4:  HYPERCHOLESTEROLEMIA (ICD-272.0) FLP looks good on Crestor 5mg /d... Her updated medication list for this problem includes:    Crestor 10 Mg Tabs (Rosuvastatin calcium) .Marland Kitchen... Take 1/2 tab by mouth at bedtime  Problem # 5:  HYPOTHYROIDISM (ICD-244.9) Stable on Synth50... Her updated medication list for this  problem includes:    Synthroid 50 Mcg Tabs (Levothyroxine sodium) .Marland Kitchen... Take 1 by mouth once daily  Problem # 6:  INTERSTITIAL CYSTITIS (ICD-595.1) She is followed by Jaclyn Bennett & stable...  Problem # 7:  FIBROMYALGIA (ICD-729.1) Stable on  the herbs and Flexeril... Her updated medication list for this problem includes:    Bayer Low Strength 81 Mg Tbec (Aspirin) .Marland Kitchen... Take 1 by mouth once daily    Cyclobenzaprine Hcl 10 Mg Tabs (Cyclobenzaprine hcl) .Marland Kitchen... Take 1 tablet by mouth once a day  Problem # 8:  ANXIETY (ICD-300.00) Stable on the alpraz... Her updated medication list for this problem includes:    Alprazolam 0.25 Mg Tabs (Alprazolam) .Marland Kitchen... 1/2 to 1 once daily as needed anxiety  Complete Medication List: 1)  Bayer Low Strength 81 Mg Tbec (Aspirin) .... Take 1 by mouth once daily 2)  Crestor 10 Mg Tabs (Rosuvastatin calcium) .... Take 1/2 tab by mouth at bedtime 3)  Coq-10 100 Mg Caps (Coenzyme q10) .... Take 1 capsule by mouth once a day 4)  Synthroid 50 Mcg Tabs (Levothyroxine sodium) .... Take 1 by mouth once daily 5)  Estradiol 1 Mg Tabs (Estradiol) .... Take /21 tablet by mouth qod 6)  Calcium 600 600 Mg Tabs (Calcium carbonate) .... 3 by mouth once daily 7)  Womens Multivitamin Plus Tabs (Multiple vitamins-minerals) .... Take 1 tab by mouth once daily.Marland KitchenMarland Kitchen 8)  Vitamin C 500 Mg Tabs (Ascorbic acid) .... Take 1 by mouth once daily 9)  Vitamin D 1000 Unit Caps (Cholecalciferol) .... Take 1 cap by mouth once daily... 10)  Vitamin E 400 Unit Caps (Vitamin e) .... Take 1 by mouth once daily 11)  Cyclobenzaprine Hcl 10 Mg Tabs (Cyclobenzaprine hcl) .... Take 1 tablet by mouth once a day 12)  Alprazolam 0.25 Mg Tabs (Alprazolam) .... 1/2 to 1 once daily as needed anxiety  Patient Instructions: 1)  Today we updated your med list- see below.... 2)  We refilled your meds per request... 3)  Today we reviewed your recent lab work & everything looks good... 4)  Call for any  problems.Marland KitchenMarland Kitchen 5)  Please schedule a follow-up appointment in 1 year. Prescriptions: CYCLOBENZAPRINE HCL 10 MG  TABS (CYCLOBENZAPRINE HCL) Take 1 tablet by mouth once a day  #90 x 4   Entered and Authorized by:   Michele Mcalpine MD   Signed by:   Michele Mcalpine MD on 11/27/2009   Method used:   Print then Give to Patient   RxID:   [id number] SYNTHROID 50 MCG  TABS (LEVOTHYROXINE SODIUM) take 1 by mouth once daily  #90 x 4   Entered and Authorized by:   Michele Mcalpine MD   Signed by:   Michele Mcalpine MD on 11/27/2009   Method used:   Print then Give to Patient   RxID:   [id number] CRESTOR 10 MG TABS (ROSUVASTATIN CALCIUM) Take 1/2 tab by mouth at bedtime  #30 x 11   Entered and Authorized by:   Michele Mcalpine MD   Signed by:   Michele Mcalpine MD on 11/27/2009   Method used:   Print then Give to Patient   RxID:   [id number]   Prevention & Chronic Care Immunizations   Influenza vaccine: Not documented    Tetanus booster: Not documented    Pneumococcal vaccine: Not documented  Colorectal Screening   Hemoccult: Not documented    Colonoscopy: Not documented  Other Screening   Pap smear: Not documented    Mammogram: BI-RADS CATEGORY 2:  Benign finding(s).^MM DIGITAL DIAGNOSTIC UNILAT R  (04/15/2008)   Smoking status: never  (08/01/2007)  Lipids   Total Cholesterol: 225  (11/26/2008)   LDL: DEL  (10/24/2006)   LDL Direct:  156.8  (11/26/2008)   HDL: 56.20  (11/26/2008)   Triglycerides: 99.0  (11/26/2008)    SGOT (AST): 24  (11/26/2008)   SGPT (ALT): 24  (11/26/2008)   Alkaline phosphatase: 85  (11/26/2008)   Total bilirubin: 0.8  (11/26/2008)  Self-Management Support :    Lipid self-management support: Not documented     Vital Signs:  Patient Profile:   60 Years Old Female Height:     63 inches Weight:      114 pounds O2 Sat:      100 % Temp:     97.7 degrees F oral Pulse rate:   87 / minute BP sitting:   112 / 68 Cuff size:   regular  Vitals  Entered By: Denna Haggard, CMA (November 27, 2009 11:25 AM)   CardioPerfect ECG  ID: [id number] Patient: Jaclyn, Bennett DOB: 1951/04/26 Age: 60 Years Old Sex: Female Race: White Physician: Lorice Lafave Technician: Denna Haggard, CMA Height: 63 Weight: 114 Status: Unconfirmed Past Medical History:   ASTHMATIC BRONCHITIS, ACUTE (ICD-466.0) Hx of ABNORMAL ELECTROCARDIOGRAM (ICD-794.31) HYPERCHOLESTEROLEMIA (ICD-272.0) HYPOTHYROIDISM (ICD-244.9) INTERSTITIAL CYSTITIS (ICD-595.1) FIBROMYALGIA (ICD-729.1) ANXIETY (ICD-300.00)   Recorded: 11/27/2009 11:48 AM P/PR: 118 ms / 160 ms - Heart rate (maximum exercise) QRS: 113 QT/QTc/QTd: 378 ms / 416 ms / 60 ms - Heart rate (maximum exercise)  P/QRS/T axis: 78 deg / 45 deg / -81 deg - Heart rate (maximum exercise)  Heartrate: 82 bpm  Interpretation:  Normal sinus rhythm with rate of:  82/min... Tracing shows sl IVCD, diffuse STTWA, no change from old tracings... SN

## 2010-07-14 NOTE — Assessment & Plan Note (Signed)
Summary: congestion in chest/la   CC:  8 month ROV & add-on for URI....  History of Present Illness: 60 y/o WF here for a follow up visit & add-on due to upper resp tract symptoms...   ~  November 27, 2008:  last seen by me 3/09 w/ refractory AB & responded to Medrol, Avelox, MMW, fluids, etc... she has seen TP on severe occas w/ sore throats, bronchitic infections, etc... overall doing reasonably well and here for a CPX today... she denies CP/ palpit/ SOB/ dizzy/ syncope/ edema/ etc...   ~  July 28, 2009:  she was seen by TP on several occas for upper resp tract infections and AB requiring Pred, Avelox, Mucinex, etc...  presents today w/ similar URI symptoms after exposure to grand daughter w/ the flu... pt c/o cough, congestion, feeling poorly, temp to 99.6 - all over the past 3-4 d... she states symptoms persist despite Mucinex & fluids, & that ZPak doesn't work for her- wants Avelox...    Current Problems:   ASTHMATIC BRONCHITIS, ACUTE (ICD-466.0) - she gets 1-2 bouts of bronchitis yearly & they are "hard to shake"- requiring Medrol, Antibiotics, Mucinex, etc... prev doing well - w/o cough, sputum, hemoptysis, worsening dyspnea, wheezing, chest pains, snoring, daytime hypersomnolence, etc...   Hx of ABNORMAL ELECTROCARDIOGRAM (ICD-794.31) - seen by [name] in 2001... she is asymptomatic and denies CP, palipit, dizziness, syncope, dyspnea, edema, etc...   ~  baseline EKG w/ poor R progression V1-3, diffuse SSTTWA...   ~  2DEcho 8/01 w/ mild ant wall HK, norm EF, valves normal...  ~  Cardiolite 8/01 showed norm perfusion, no ischemia, EF=61%...  HYPERCHOLESTEROLEMIA (ICD-272.0) - treated w/ diet + CRESTOR 10mg - 1/2 daily + CoQ10 100mg /d... she has prev refused meds & still c/o some leg aching/ muscle cramps...  ~  TChol as high as 228, LDL up to 160 in 2007  ~  FLP 5/08 showed TChol 206, TG 62, HDL 50, LDL 132  ~  FLP 6/10 showed TChol 225, TG 99, HDL 56, LDL 157... rec> statin Rx- she  initially declined but agreed to try Crestor5mg  w/ CoQ10...  HYPOTHYROIDISM (ICD-244.9) - on SYNTHROID 19mcg/d... clinically euthyroid...  ~  TSH as high as 6.35 in 2007, started on Rx...   ~  TSH 5/08 = 4.75 on Rx...  ~  labs 6/10 showed TSH= 4.99  INTERSTITIAL CYSTITIS (ICD-595.1) - followed by [name]...  FIBROMYALGIA (ICD-729.1) - takes FLEXERIL 10mg Qhs...  ANXIETY (ICD-300.00) w/ hx claustraphobia...  HEALTH MAINTENANCE:  prev seen by [facility] for GYN.Marland Kitchen.  now she sees [name]- last PAP 6/09 & she has f/u appt 8/10... [facility]/ [facility] on sonar last yr & f/u due soon...  she had BMD done for baseline= normal by her report (mother had osteoporosis & she "doesn't want to get that")... she has repeatedly refused colonoscopy & refuses once again today despite long conversation about the importance of this screening procedure...   Allergies: 1)  ! Iodine 2)  ! Sulfa  Comments:  Nurse/Medical Assistant: The patient's medications and allergies were reviewed with the patient and were updated in the Medication and Allergy Lists.  Past History:  Past Medical History:  ASTHMATIC BRONCHITIS, ACUTE (ICD-466.0) Hx of ABNORMAL ELECTROCARDIOGRAM (ICD-794.31) HYPERCHOLESTEROLEMIA (ICD-272.0) HYPOTHYROIDISM (ICD-244.9) INTERSTITIAL CYSTITIS (ICD-595.1) FIBROMYALGIA (ICD-729.1) ANXIETY (ICD-300.00)  Past Surgical History: hysterectomy 1997  Family History: Reviewed history from 11/27/2008 and no changes required. Father died age 67 w/ Alzheimers, stroke, CHF Mother died age 64 w/  bronchiectasis, pneumonia, CHF 2 Siblings: Brother died w/ islet cell pancreatic cancer Sister, [name], has bronchiectasis & hyperchol  Social History: Reviewed history from 11/27/2008 and no changes required. 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  The patient denies anorexia, fever, weight loss, weight gain, vision loss, decreased hearing, hoarseness, chest pain, syncope, peripheral edema, prolonged cough, headaches, hemoptysis, abdominal pain, melena, hematochezia, severe indigestion/heartburn, hematuria, incontinence, muscle weakness, suspicious skin lesions, transient blindness, difficulty walking, depression, unusual weight change, abnormal bleeding, enlarged lymph nodes, and angioedema.    Vital Signs:  Patient profile:   60 year old female Height:      63 inches Weight:      122.50 pounds O2 Sat:      98 % on Room air Temp:     97.1 degrees F oral Pulse rate:   90 / minute BP sitting:   112 / 70  (right arm) Cuff size:   regular  Vitals Entered By: Randell Loop CMA (July 28, 2009 3:21 PM)  O2 Sat at Rest %:  98 O2 Flow:  Room air CC: 8 month ROV & add-on for URI... Is Patient Diabetic? No Pain Assessment Patient in pain? no      Comments no changes in meds today   Physical Exam  Additional Exam:  WD, WN, 60 y/o WF in NAD... GENERAL:  Alert & oriented; pleasant & cooperative... HEENT:  [place]/AT, EOM-wnl, PERRLA, Fundi-benign, EACs-clear, TMs-wnl, NOSE-sl red, THROAT-clear & wnl. NECK:  Supple w/ full ROM; no JVD; normal carotid impulses w/o bruits; no thyromegaly or nodules palpated; no lymphadenopathy. CHEST:  Clear to P & A; without wheezes/ rales/ or rhonchi. HEART:  Regular Rhythm; without murmurs/ rubs/ or gallops. ABDOMEN:  Soft & nontender; normal bowel sounds; no organomegaly or masses detected. EXT: without deformities or arthritic changes; no varicose veins/ venous insuffic/ or edema. NEURO:  CN's intact; motor testing normal; sensory testing normal; gait normal & balance OK. DERM:  No lesions noted; no rash etc...    Impression & Recommendations:  Problem # 1:  URI (ICD-465.9) She is quite specific that  she will need a Depo shot & Avelox to get over this episode...  We discussed Rx w/ Mucinex, Fluids, Delsym, nasal saline, etc... Her updated medication list for this problem includes:    Bayer Low Strength 81 Mg Tbec (Aspirin) .Marland Kitchen... Take 1 by mouth once daily    Mucinex Maximum Strength 1200 Mg Xr12h-tab (Guaifenesin) .Marland Kitchen... 1-2 every 12 hours as needed    Tessalon 200 Mg Caps (Benzonatate) .Marland Kitchen... 1 by mouth three times a day as needed cough  Problem # 2:  ASTHMATIC BRONCHITIS, ACUTE (ICD-466.0) Chest sounds OK & she is hoping to keep this from going to her chest... Her updated medication list for this problem includes:    Mucinex Maximum Strength 1200 Mg Xr12h-tab (Guaifenesin) .Marland Kitchen... 1-2 every 12 hours as needed    Tessalon 200 Mg Caps (Benzonatate) .Marland Kitchen... 1 by mouth three times a day as needed cough    Avelox 400 Mg Tabs (Moxifloxacin hcl) .Marland Kitchen... Take 1 tab by mouth once daily til gone...  Problem # 3:  HYPERCHOLESTEROLEMIA (ICD-272.0) Continue the Crestor & coQ10... f/u FLP w/ CPX in June. Her updated medication list for this problem includes:  Crestor 10 Mg Tabs (Rosuvastatin calcium) .Marland Kitchen... Take 1/2 tab by mouth at bedtime  Problem # 4:  HYPOTHYROIDISM (ICD-244.9) Stable-  clinically euthyroid. Her updated medication list for this problem includes:    Synthroid 50 Mcg Tabs (Levothyroxine sodium) .Marland Kitchen... Take 1 by mouth once daily  Problem # 5:  OTHER MEDICAL PROBLEMS AS NOTED>>>  Complete Medication List: 1)  Bayer Low Strength 81 Mg Tbec (Aspirin) .... Take 1 by mouth once daily 2)  Crestor 10 Mg Tabs (Rosuvastatin calcium) .... Take 1/2 tab by mouth at bedtime 3)  Coq-10 100 Mg Caps (Coenzyme q10) .... Take 1 capsule by mouth once a day 4)  Synthroid 50 Mcg Tabs (Levothyroxine sodium) .... Take 1 by mouth once daily 5)  Estradiol 1 Mg Tabs (Estradiol) .... Take /21 tablet by mouth once a day 6)  Calcium 600 600 Mg Tabs (Calcium carbonate) .... 3 by mouth once daily 7)  Womens  Multivitamin Plus Tabs (Multiple vitamins-minerals) .... Take 1 tab by mouth once daily.Marland KitchenMarland Kitchen 8)  Vitamin C 500 Mg Tabs (Ascorbic acid) .... Take 1 by mouth once daily 9)  Vitamin D 1000 Unit Caps (Cholecalciferol) .... Take 1 cap by mouth once daily... 10)  Vitamin E 400 Unit Caps (Vitamin e) .... Take 1 by mouth once daily 11)  Cyclobenzaprine Hcl 10 Mg Tabs (Cyclobenzaprine hcl) .... Take 1 tablet by mouth once a day 12)  Alprazolam 0.25 Mg Tabs (Alprazolam) .... 1/2 to 1 once daily as needed anxiety 13)  Mucinex Maximum Strength 1200 Mg Xr12h-tab (Guaifenesin) .Marland Kitchen.. 1-2 every 12 hours as needed 14)  Tessalon 200 Mg Caps (Benzonatate) .Marland Kitchen.. 1 by mouth three times a day as needed cough 15)  Avelox 400 Mg Tabs (Moxifloxacin hcl) .... Take 1 tab by mouth once daily til gone...  Other Orders: Prescription Created Electronically [id number])  Patient Instructions: 1)  Today we updated your med list- see below.... 2)  Today we gave you a Depo shot & a new perscription for AVELOX to treat your upper resp tract infection.Marland KitchenMarland Kitchen 3)  You should drink plenty of fluids, take the Mucinex Max twice daily, and use the Saline nasal mist every 1-2 H while awake.Marland KitchenMarland Kitchen 4)  You may use DELSYM OTC cough syrup as needed (1 tablesp twice daily as needed) 5)  Call for any problems.Marland KitchenMarland Kitchen 6)  Let's plan your routine physical w/ CXR, EKG, and FSSTING blood work in June. Prescriptions: AVELOX 400 MG TABS (MOXIFLOXACIN HCL) take 1 tab by mouth once daily til gone...  #5 x 1   Entered and Authorized by:   Michele Mcalpine MD   Signed by:   Michele Mcalpine MD on 07/28/2009   Method used:   Print then Give to Patient   RxID:   [id number]   Appended Document: congestion in chest/la   Medication Administration  Injection # 1:    Medication: Depo- Medrol 80mg     Diagnosis: ASTHMATIC BRONCHITIS, ACUTE (ICD-466.0)    Route: IM    Site: RUOQ gluteus    Exp Date: 03-2010    Lot #: [id number]    Mfr: teva    Patient tolerated  injection without complications    Given by: Randell Loop CMA (July 28, 2009 4:23 PM)  Orders Added: 1)  Depo- Medrol 80mg  [J1040] 2)  Admin of Therapeutic Inj  intramuscular or subcutaneous [24401]

## 2010-07-14 NOTE — Progress Notes (Signed)
Summary: sinus infection > try augmentin x 10 days as failed omnicef  Phone Note Call from Patient   Caller: Patient Call For: parrett Summary of Call: green congestion [facility] Initial call taken by: Rickard Patience,  May 08, 2010 11:59 AM  Follow-up for Phone Call        called spoke with patient who states her bronchitis has cleared - finished the omnicef on the 23rd and the pred taper this AM (both prescribed by TP at 11.17.11 ov).  c/o sinus congestion w/ thick green mucus onset last PM.  denies sore throat, PND, f/c/s.  will forward to doc of the day - please  advise, thanks!  allergies: iodine, sulfa.  [facility]. Follow-up by: Boone Master CNA/MA,  May 08, 2010 12:15 PM  Additional Follow-up for Phone Call Additional follow up Details #1::        augmentin 875 one twice daily x 10 days befast and supper with large glass of water Additional Follow-up by: Nyoka Cowden MD,  May 08, 2010 12:35 PM    Additional Follow-up for Phone Call Additional follow up Details #2::    called spoke with patient, advised of MW's recs as stated above.  pt verbalized her understanding.  rx sent to verified pharmacy.  New/Updated Medications: AUGMENTIN 875-125 MG TABS (AMOXICILLIN-POT CLAVULANATE) one twice daily x 10 days breakfast and supper with large glass of water Prescriptions: AUGMENTIN 875-125 MG TABS (AMOXICILLIN-POT CLAVULANATE) one twice daily x 10 days breakfast and supper with large glass of water  #20 x 0   Entered by:   Boone Master CNA/MA   Authorized by:   Nyoka Cowden MD   Signed by:   Boone Master CNA/MA on 05/08/2010   Method used:   Electronically to        [facility]* (retail)       [address]       [place], [place]  [address]       Ph: [phone]       Fax: [phone]   RxID:   [id number]

## 2010-07-14 NOTE — Assessment & Plan Note (Signed)
Summary: Acute NP office visit - bronchitis   CC:  prod cough with green mucus, hoarseness, sore throat, increased SOB, tightness in chest x5days - has been using mucinex, prilosec, and tessalon perles.  History of Present Illness: The patient is a 60 -year-old white female patient of Dr. Jodelle Green who has a known history of mild asthmatic bronchitis, fibromyalgia.     ~  July 28, 2009:  she was seen by TP on several occas for upper resp tract infections and AB requiring Pred, Avelox, Mucinex, etc...  presents today w/ similar URI symptoms after exposure to grand daughter w/ the flu... pt c/o cough, congestion, feeling poorly, temp to 99.6 - all over the past 3-4 d... she states symptoms persist despite Mucinex & fluids, & that ZPak doesn't work for her- wants Avelox...   ~  November 27, 2009:  "I'm doing good" she says and credits the improvement a an immune-enhancer from the health food store- ASTRALAGUS- "it's an herb that helps the lungs & immune system" & notes no further AB exac etc... her Chol is OK on the Crestor & CoQ10;  Thyroid controlled on the Synthroid50;  bladder symptoms stable;  & FM/ Anxiety controlled w/ Flex & Alpraz...   March 31, 2010 --Presents for an acute office visit. Complains of 1 week of cough, congestion with green mucus, lots of drainage in throat, fever. OTC not helping. Feels run down. Hoarseness is starting.  >>tx w/ avelox   April 30, 2010-- Presents for an acute office visit. Complains of productive  cough with green mucus, hoarseness, sore throat, increased SOB, tightness in chest x5days - has been using mucinex, prilosec, tessalon perles. Was seen 1 month ago with similar symptoms she did get better with avelox w/ total resolution of symptoms. She is under alot of stress with upcoming holidays and her daughter is pregnant again. Pt is raising her 13yr granddaughter. Denies chest pain, orthopnea, hemoptysis, fever, n/v/d. 1   Medications Prior to Update: 1)   Bayer Low Strength 81 Mg  Tbec (Aspirin) .... Take 1 By Mouth Once Daily 2)  Crestor 10 Mg Tabs (Rosuvastatin Calcium) .... Take 1/2 Tab By Mouth At Bedtime 3)  Coq-10 100 Mg Caps (Coenzyme Q10) .... Take 1 Capsule By Mouth Once A Day 4)  Synthroid 50 Mcg  Tabs (Levothyroxine Sodium) .... Take 1 By Mouth Once Daily 5)  Estradiol 1 Mg  Tabs (Estradiol) .... Take 1/2 Tablet By Mouth Every Other Day 6)  Calcium 600 600 Mg  Tabs (Calcium Carbonate) .... 3 By Mouth Once Daily 7)  Womens Multivitamin Plus  Tabs (Multiple Vitamins-Minerals) .... Take 1 Tab By Mouth Once Daily.Marland KitchenMarland Kitchen 8)  Vitamin C 500 Mg  Tabs (Ascorbic Acid) .... Take 1 By Mouth Once Daily 9)  Vitamin D 1000 Unit Caps (Cholecalciferol) .... Take 1 Cap By Mouth Once Daily... 10)  Vitamin E 400 Unit  Caps (Vitamin E) .... Take 1 By Mouth Once Daily 11)  Cyclobenzaprine Hcl 10 Mg  Tabs (Cyclobenzaprine Hcl) .... Take 1 Tablet By Mouth Once A Day 12)  Alprazolam 0.25 Mg Tabs (Alprazolam) .... 1/2 To 1 Once Daily As Needed Anxiety 13)  Avelox 400 Mg Tabs (Moxifloxacin Hcl) .Marland Kitchen.. 1 By Mouth Once Daily 14)  Majic Mouthwash .... 1 Tsp Four Times A Day As Needed Sore Throat , Swish and Swallow  Current Medications (verified): 1)  Bayer Low Strength 81 Mg  Tbec (Aspirin) .... Take 1 By Mouth Once Daily 2)  Crestor 10  Mg Tabs (Rosuvastatin Calcium) .... Take 1/2 Tab By Mouth At Bedtime 3)  Coq-10 100 Mg Caps (Coenzyme Q10) .... Take 1 Capsule By Mouth Once A Day 4)  Synthroid 50 Mcg  Tabs (Levothyroxine Sodium) .... Take 1 By Mouth Once Daily 5)  Estradiol 1 Mg  Tabs (Estradiol) .... Take 1/2 Tablet By Mouth Every Other Day 6)  Calcium 600 600 Mg  Tabs (Calcium Carbonate) .... 3 By Mouth Once Daily 7)  Womens Multivitamin Plus  Tabs (Multiple Vitamins-Minerals) .... Take 1 Tab By Mouth Once Daily.Marland KitchenMarland Kitchen 8)  Vitamin C 500 Mg  Tabs (Ascorbic Acid) .... Take 1 By Mouth Once Daily 9)  Vitamin D 1000 Unit Caps (Cholecalciferol) .... Take 1 Cap By Mouth  Once Daily... 10)  Vitamin E 400 Unit  Caps (Vitamin E) .... Take 1 By Mouth Once Daily 11)  Cyclobenzaprine Hcl 10 Mg  Tabs (Cyclobenzaprine Hcl) .... Take 1 Tablet By Mouth Once A Day 12)  Alprazolam 0.25 Mg Tabs (Alprazolam) .... 1/2 To 1 Once Daily As Needed Anxiety 13)  Majic Mouthwash .... 1 Tsp Four Times A Day As Needed Sore Throat , Swish and Swallow  Allergies (verified): 1)  ! Iodine 2)  ! Sulfa  Past History:  Past Medical History: Last updated: 12-01-09 ASTHMATIC BRONCHITIS, ACUTE (ICD-466.0) Hx of ABNORMAL ELECTROCARDIOGRAM (ICD-794.31) HYPERCHOLESTEROLEMIA (ICD-272.0) HYPOTHYROIDISM (ICD-244.9) INTERSTITIAL CYSTITIS (ICD-595.1) FIBROMYALGIA (ICD-729.1) ANXIETY (ICD-300.00)  Family History: Last updated: 12/01/09 Father died age 73 w/ Alzheimers, stroke, CHF Mother died age 49 w/ bronchiectasis, pneumonia, CHF 2 Siblings: Brother died w/ islet cell pancreatic cancer Sister, [name], has bronchiectasis & hyperchol  Social History: Last updated: 04/30/2010 Divorced 3 Children- 39, 35, 25 Never smoked Social alcohol Engineer, site- substitute teaching now plays piano, 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 declines flu and pneumonia shot 04-30-10  Social History: Divorced 3 Children- 39, 62, 25 Never smoked Social alcohol Engineer, site- substitute teaching now plays piano, 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 No Comments Medications reviewed with patient Daytime contact number verified with patient. Boone Master CNA/MA  April 30, 2010 11:45 AM    Physical Exam  Additional Exam:   GEN: A/Ox3; pleasant , NAD HEENT:  [place]/AT, , EACs-clear, TMs-nml  NOSE-pale, w/ clear d/ch, THROAT-clear NECK:  Supple w/ fair ROM; no JVD; normal carotid impulses w/o bruits; no thyromegaly or nodules palpated; no lymphadenopathy. RESP  Coarse BS w/ no wheezing  CARD:  RRR, no m/r/g   GI:   Soft & nt; nml bowel sounds; no organomegaly or masses detected. Muscu: Warm bil,  no calf tenderness edema, clubbing, pulses intact Neuro:  A/Ox3 no focal deficits     Impression & Recommendations:  Problem # 1:  ASTHMATIC BRONCHITIS, ACUTE (ICD-466.0)  Recurrent exacerbations  advised on stress reducers, healthy lifestyles  Plan:  Omnicef 300mg  two times a day for 7 days Mucinex DM two times a day as needed cough/congestion Prednisone taper over next week . Increase fluids and rest  Tylenol as needed  Please contact office for sooner follow up if symptoms do not improve or worsen  The following medications were removed from the medication list:    Avelox 400 Mg Tabs (Moxifloxacin hcl) .Marland Kitchen... 1 by mouth once daily Her updated medication list for this problem includes:    Cefdinir 300 Mg Caps (Cefdinir) .Marland Kitchen... 1 by mouth two times a day  Orders: Est. Patient Level IV ([address])  Medications Added to Medication List This Visit: 1)  Estradiol 1 Mg Tabs (Estradiol) .... Take 1/2 tablet by mouth every other day 2)  Cefdinir 300 Mg Caps (Cefdinir) .Marland Kitchen.. 1 by mouth two times a day 3)  Prednisone 10 Mg Tabs (Prednisone) .... 4 tabs for 2 days, then 3 tabs for 2 days, 2 tabs for 2 days, then 1 tab for 2 days, then stop  Complete Medication List: 1)  Bayer Low Strength 81 Mg Tbec (Aspirin) .... Take 1 by mouth once daily 2)  Crestor 10 Mg Tabs (Rosuvastatin calcium) .... Take 1/2 tab by  mouth at bedtime 3)  Coq-10 100 Mg Caps (Coenzyme q10) .... Take 1 capsule by mouth once a day 4)  Synthroid 50 Mcg Tabs (Levothyroxine sodium) .... Take 1 by mouth once daily 5)  Estradiol 1 Mg Tabs (Estradiol) .... Take 1/2 tablet by mouth every other day 6)  Calcium 600 600 Mg Tabs (Calcium carbonate) .... 3 by mouth once daily 7)  Womens Multivitamin Plus Tabs (Multiple vitamins-minerals) .... Take 1 tab by mouth once daily.Marland KitchenMarland Kitchen 8)  Vitamin C 500 Mg Tabs (Ascorbic acid) .... Take 1 by mouth once daily 9)  Vitamin D 1000 Unit Caps (Cholecalciferol) .... Take 1 cap by mouth once daily... 10)  Vitamin E 400 Unit Caps (Vitamin e) .... Take 1 by mouth once daily 11)  Cyclobenzaprine Hcl 10 Mg Tabs (Cyclobenzaprine hcl) .... Take 1 tablet by mouth once a day 12)  Alprazolam 0.25 Mg Tabs (Alprazolam) .... 1/2 to 1 once daily as needed anxiety 13)  Majic Mouthwash  .... 1 tsp four times a day as needed sore throat , swish and swallow 14)  Cefdinir 300 Mg Caps (Cefdinir) .Marland Kitchen.. 1 by mouth two times a day 15)  Prednisone 10 Mg Tabs (Prednisone) .... 4 tabs for 2 days, then 3 tabs for 2 days, 2 tabs for 2 days, then 1 tab for 2 days, then stop  Patient Instructions: 1)  Omnicef 300mg  two times a day for 7 days 2)  Mucinex DM two times a day as needed cough/congestion 3)  Prednisone taper over next week . 4)  Increase fluids and rest  5)  Tylenol as needed  6)  Please contact office for sooner follow up if symptoms do not improve or worsen  Prescriptions: ALPRAZOLAM 0.25 MG TABS (ALPRAZOLAM) 1/2 to 1 once daily as needed anxiety  #30 x 0   Entered and Authorized by:   Rubye Oaks NP   Signed by:   Tammy Parrett NP on 04/30/2010   Method used:   Print then Give to Patient   RxID:   [id number] PREDNISONE 10 MG TABS (PREDNISONE) 4 tabs for 2 days, then 3 tabs for 2 days, 2 tabs for 2 days, then 1 tab  for 2 days, then stop  #20 x 0   Entered and Authorized by:   Rubye Oaks NP   Signed by:    Tammy Parrett NP on 04/30/2010   Method used:   Electronically to        [facility]* (retail)       [address]       [place], [place]  [address]       Ph: [phone]       Fax: [phone]   RxID:   [id number] CEFDINIR 300 MG CAPS (CEFDINIR) 1 by mouth two times a day  #14 x 1   Entered and Authorized by:   Rubye Oaks NP   Signed by:   Tammy Parrett NP on 04/30/2010   Method used:   Electronically to        [facility]* (retail)       [address]       [place], [place]  [address]       Ph: [phone]       Fax: [phone]   RxID:   [id number]

## 2010-07-14 NOTE — Progress Notes (Signed)
Summary: labs  Phone Note Call from Patient Call back at Home Phone [phone]   Caller: Patient Call For: Syretta Kochel Reason for Call: Talk to Nurse Summary of Call: need appt to do labs on Tuesday AM for appt on Thursday. Initial call taken by: Eugene Gavia,  November 24, 2009 8:52 AM  Follow-up for Phone Call        Please advise what labs to order thanks. Carron Curie CMA  November 24, 2009 9:12 AM   Additional Follow-up for Phone Call Additional follow up Details #1::        labs are in computer for pt for tuesday. Randell Loop CMA  November 24, 2009 10:16 AM

## 2010-07-14 NOTE — Progress Notes (Signed)
Summary: congestion, cough - OV   Phone Note Call from Patient Call back at [phone]   Caller: Patient Call For: parrett Summary of Call: pt have green congestion and tightness and pain in her chest. requesting avelox. using mucinex [facility]  Initial call taken by: Rickard Patience,  April 29, 2010 9:48 AM  Follow-up for Phone Call        Called, spoke with pt.  She c/o chest congestion, prod cough with green mucus, chest tightness, hoarseness, and low grade fever x 3 days.  Denies wheezing and SOB.  Taking mucinex dm and tesselon pearles.  Requesting avelox x 7 days.   [facility] Allergies:  Iodine, sulfa Dr. Kriste Basque, pls advise.  Thanks! Follow-up by: Gweneth Dimitri RN,  April 29, 2010 10:13 AM  Additional Follow-up for Phone Call Additional follow up Details #1::        just seen given avelox ? did she get better try otc meds for cold  if not better ov Please contact office for sooner follow up if symptoms do not improve or worsen  Additional Follow-up by: Rubye Oaks NP,  April 29, 2010 10:32 AM    Additional Follow-up for Phone Call Additional follow up Details #2::    Called, spoke with pt.  She states she did get better from last OV but sxs returned 3 days ago.  States she is already taking mucinex dm and tesselon pearles and believes she  'needs an abx.'  Pt scheduled OV with TP for tomorrow at 11:30 am.  Gweneth Dimitri RN  April 29, 2010 10:41 AM

## 2010-08-27 ENCOUNTER — Ambulatory Visit: Payer: Self-pay | Admitting: Adult Health

## 2010-09-24 ENCOUNTER — Telehealth: Payer: Self-pay | Admitting: Pulmonary Disease

## 2010-09-24 NOTE — Telephone Encounter (Signed)
Advised pt to call back the week of her cpx to have labs done. Pt verbalized understanding

## 2010-09-28 ENCOUNTER — Telehealth: Payer: Self-pay | Admitting: Pulmonary Disease

## 2010-09-28 NOTE — Telephone Encounter (Signed)
Called pt and then pt phone hung up. Atc pt back but got a line busy x 3. WCB

## 2010-09-28 NOTE — Telephone Encounter (Signed)
Pt is aware no abx needed prior to tooth extraction. She says she will consider having a colonoscopy and discuss this with Sn at her CPX in June 2012.

## 2010-09-28 NOTE — Telephone Encounter (Signed)
Per SN---based on her prev echo she does not need abx prior to having a tooth pulled.  But she does need a colonoscopy scheduled at her earliest convenience.  Thanks

## 2010-09-28 NOTE — Telephone Encounter (Signed)
Spoke w/ pt and she states she is scheduled 4/19 to have a tooth pulled. Pt states b/c she has mitral valve prolapse does she need to take an abx before getting her tooth pulled? Pt states her dentists gave her pcn 500 mg and she is to take 4 tablets 1 hr before the procedure. Pt wants to know does she have to take this bc it makes her so sick every time she takes pcn. Pt states if she needs to take an abx what can she take other than pcn. Please advise Dr. Kriste Basque. Thanks  Allergies  Allergen Reactions  . Iodine   . Sulfonamide Derivatives     REACTION: throat swelling and itching    Carver Fila, CMA

## 2010-10-27 NOTE — Assessment & Plan Note (Signed)
[facility]                             PULMONARY OFFICE NOTE   NAME:Yordy, Jaclyn Bennett                      MRN:          [id number]  DATE:05/01/2007                            DOB:          November 19, 1950    HISTORY OF PRESENT ILLNESS:  The patient is a 60 year old white female  patient of Dr. Jodelle Green who has a known history of mild asthmatic  bronchitis, fibromyalgia, that presents today for an acute office visit.  Patient complains of a 2-week history of nasal congestion, post nasal  drip, and general malaise.  Patient reports she recently had bronchitis  and took a 5-day course of Avelox.  Patient reports symptoms did improve  but she still has congestion, chest tightness and shortness of breath.  The patient denies any exertional chest pain, radiating symptoms,  nausea, vomiting, diaphoresis.   PAST MEDICAL HISTORY:  Is reviewed.   CURRENT MEDICATIONS:  Reviewed.   PHYSICAL EXAMINATION:  The patient is a thin female in no acute  distress. She is afebrile with stable vital signs. Her O2 saturation is  100% on room air.  HEENT:  Unremarkable.  NECK:  Supple without cervical adenopathy. No JVD.  LUNGS:  Clear.  CARDIAC:  Regular rate.  ABDOMEN:  Soft and nontender.  EXTREMITIES:  Are warm without any edema.   IMPRESSION AND PLAN:  Slow to resolve tracheobronchitis. Patient is to  begin Doxycycline x7 days. Mucinex DM twice daily.  Patient is to return back with Dr. Kriste Basque as scheduled, sooner if needed.      Rubye Oaks, NP  Electronically Signed      Lonzo Cloud. Kriste Basque, MD  Electronically Signed   TP/MedQ  DD: 05/02/2007  DT: 05/02/2007  Job #: [id number]

## 2010-10-30 NOTE — Assessment & Plan Note (Signed)
[facility]                             PULMONARY OFFICE NOTE   NAME:Jaclyn Bennett, Jaclyn Bennett                      MRN:          [id number]  DATE:09/08/2006                            DOB:          March 13, 1951    HISTORY OF PRESENT ILLNESS:  The patient is a 60 year old white female  patient of Dr. Jodelle Green who presents for an acute office visit.  The  patient complains of a 1-week history of nasal congestion, postnasal  drip, sore throat, dry cough.  The patient denies any fever, purulent  sputum, chest pain, orthopnea, PND, or leg swelling.   PAST MEDICAL HISTORY:  Reviewed.   CURRENT MEDICATIONS:  Reviewed.   PHYSICAL EXAMINATION:  GENERAL:  The patient is a pleasant female in no  acute distress.  VITAL SIGNS:  She is afebrile with stable vital signs.  O2 saturation is  100% on room air.  HEENT:  Nasal mucosa is slightly pale.  Nontender sinus.  Conjunctivae  are not injected.  TMs normal.  NECK:  Supple without cervical adenopathy.  No JVD.  RESPIRATORY:  Lung sounds are clear to auscultation bilaterally.  CARDIAC:  Regular rate and rhythm.  ABDOMEN:  Soft and nontender.  EXTREMITIES:  Warm without any edema.   IMPRESSION AND PLAN:  Acute upper respiratory infection with a rhinitis.  The patient is to begin Nasacort AQ two puffs twice daily along with  saline nasal spray.  The patient may use Claritin over the counter as  needed.  The patient was given a prescription for a Z-Pak to have on  hold per patient request because the patient is recommended not to use  this, suspect this is all viral/allergy induced, and to only use if she  develops fever or purulent sputum that persists longer than 5-7 days.  The patient will follow back up with Dr. Kriste Basque as scheduled or sooner if  needed.      Rubye Oaks, NP  Electronically Signed      Lonzo Cloud. Kriste Basque, MD  Electronically Signed   TP/MedQ  DD: 09/08/2006  DT: 09/08/2006  Job #: [id number]

## 2010-11-28 ENCOUNTER — Encounter: Payer: Self-pay | Admitting: Pulmonary Disease

## 2010-11-30 ENCOUNTER — Telehealth: Payer: Self-pay | Admitting: Pulmonary Disease

## 2010-11-30 DIAGNOSIS — E039 Hypothyroidism, unspecified: Secondary | ICD-10-CM

## 2010-11-30 DIAGNOSIS — F411 Generalized anxiety disorder: Secondary | ICD-10-CM

## 2010-11-30 DIAGNOSIS — Z0000 Encounter for general adult medical examination without abnormal findings: Secondary | ICD-10-CM

## 2010-11-30 DIAGNOSIS — E7800 Pure hypercholesterolemia, unspecified: Secondary | ICD-10-CM

## 2010-11-30 NOTE — Telephone Encounter (Signed)
Orders have been placed in the computer.  Called the pt to make her aware.  Pt will come in the am for fasting labs.

## 2010-12-01 ENCOUNTER — Other Ambulatory Visit (INDEPENDENT_AMBULATORY_CARE_PROVIDER_SITE_OTHER): Payer: BC Managed Care – PPO

## 2010-12-01 DIAGNOSIS — Z0000 Encounter for general adult medical examination without abnormal findings: Secondary | ICD-10-CM

## 2010-12-01 LAB — CBC WITH DIFFERENTIAL/PLATELET
Basophils Absolute: 0 10*3/uL (ref 0.0–0.1)
Basophils Relative: 0.6 % (ref 0.0–3.0)
Eosinophils Absolute: 0.2 10*3/uL (ref 0.0–0.7)
MCHC: 34 g/dL (ref 30.0–36.0)
MCV: 93.1 fl (ref 78.0–100.0)
Monocytes Absolute: 0.5 10*3/uL (ref 0.1–1.0)
Neutrophils Relative %: 50.8 % (ref 43.0–77.0)
Platelets: 303 10*3/uL (ref 150.0–400.0)
RDW: 12.9 % (ref 11.5–14.6)

## 2010-12-01 LAB — HEPATIC FUNCTION PANEL
ALT: 34 U/L (ref 0–35)
AST: 31 U/L (ref 0–37)
Bilirubin, Direct: 0.1 mg/dL (ref 0.0–0.3)
Total Bilirubin: 0.5 mg/dL (ref 0.3–1.2)

## 2010-12-01 LAB — LIPID PANEL
Cholesterol: 165 mg/dL (ref 0–200)
LDL Cholesterol: 91 mg/dL (ref 0–99)
Total CHOL/HDL Ratio: 3

## 2010-12-01 LAB — TSH: TSH: 5.72 u[IU]/mL — ABNORMAL HIGH (ref 0.35–5.50)

## 2010-12-01 LAB — BASIC METABOLIC PANEL
CO2: 31 mEq/L (ref 19–32)
Chloride: 106 mEq/L (ref 96–112)
Potassium: 4.3 mEq/L (ref 3.5–5.1)

## 2010-12-03 ENCOUNTER — Ambulatory Visit (INDEPENDENT_AMBULATORY_CARE_PROVIDER_SITE_OTHER): Payer: BC Managed Care – PPO | Admitting: Pulmonary Disease

## 2010-12-03 ENCOUNTER — Encounter: Payer: Self-pay | Admitting: Pulmonary Disease

## 2010-12-03 VITALS — BP 110/70 | HR 91 | Temp 97.2°F | Ht 63.0 in | Wt 120.4 lb

## 2010-12-03 DIAGNOSIS — R9431 Abnormal electrocardiogram [ECG] [EKG]: Secondary | ICD-10-CM

## 2010-12-03 DIAGNOSIS — IMO0001 Reserved for inherently not codable concepts without codable children: Secondary | ICD-10-CM

## 2010-12-03 DIAGNOSIS — F411 Generalized anxiety disorder: Secondary | ICD-10-CM

## 2010-12-03 DIAGNOSIS — E7800 Pure hypercholesterolemia, unspecified: Secondary | ICD-10-CM

## 2010-12-03 DIAGNOSIS — J309 Allergic rhinitis, unspecified: Secondary | ICD-10-CM

## 2010-12-03 DIAGNOSIS — Z23 Encounter for immunization: Secondary | ICD-10-CM

## 2010-12-03 DIAGNOSIS — N3010 Interstitial cystitis (chronic) without hematuria: Secondary | ICD-10-CM

## 2010-12-03 DIAGNOSIS — E039 Hypothyroidism, unspecified: Secondary | ICD-10-CM

## 2010-12-03 DIAGNOSIS — Z0000 Encounter for general adult medical examination without abnormal findings: Secondary | ICD-10-CM

## 2010-12-03 MED ORDER — ALPRAZOLAM 0.25 MG PO TABS: ORAL_TABLET | ORAL | Status: DC

## 2010-12-03 MED ORDER — ROSUVASTATIN CALCIUM 10 MG PO TABS: ORAL_TABLET | ORAL | Status: DC

## 2010-12-03 MED ORDER — TETANUS-DIPHTH-ACELL PERTUSSIS 5-2.5-18.5 LF-MCG/0.5 IM SUSP: 0.5000 mL | Freq: Once | INTRAMUSCULAR | Status: DC

## 2010-12-03 NOTE — Patient Instructions (Signed)
Today we updated your med list in EPIC>    We refilled your meds today per your request...    We decided to increase your SYNTHROID to 59mcg/d...  We reviewed your recent blood work & gave you a copy for your records...  Congrats on scheduling your Colonoscopy!  Please ask drBuccini to send a copy of the report to my attention...  Call for any questions...  Let's plan another routine follow up in 1 year, sooner if needed for problems.Marland KitchenMarland Kitchen

## 2010-12-03 NOTE — Progress Notes (Signed)
Subjective:    Patient ID: Jaclyn Bennett, female    DOB: 08-16-1950, 60 y.o.   MRN: [id number]  HPI 60 y/o WF here for a follow up visit & annual CPX...  ~  Jun10:  last seen by me 3/09 w/ refractory AB & responded to Medrol, Avelox, MMW, fluids, etc... she has seen TP on severe occas w/ sore throats, bronchitic infections, etc... overall doing reasonably well and here for a CPX today... she denies CP/ palpit/ SOB/ dizzy/ syncope/ edema/ etc...  ~  July 28, 2009:  she was seen by TP on several occas for upper resp tract infections and AB requiring Pred, Avelox, Mucinex, etc...  presents today w/ similar URI symptoms after exposure to grand daughter w/ the flu... pt c/o cough, congestion, feeling poorly, temp to 99.6 - all over the past 3-4 d... she states symptoms persist despite Mucinex & fluids, & that ZPak doesn't work for her- wants Avelox...  ~  November 27, 2009:  "I'm doing good" she says and credits the improvement a an immune-enhancer from the health food store- ASTRALAGUS- "it's an herb that helps the lungs & immune system" & notes no further AB exac etc... her Chol is OK on the Crestor & CoQ10;  Thyroid controlled on the Synthroid50;  bladder symptoms stable;  & FM/ Anxiety controlled w/ Flex & Alpraz...  ~  December 03, 2010:  Yearly ROV & she remains stable w/o new complaints or concerns;  Breathing well w/o intercurrent URI or AB exac;  Denies CP, palpit, SOB, edema;  FLP looks good on Cres5;  Thyroid appears sl under-replaced w/ Levo50 7 we will incr to 19mcg/d;  NOTE: she has made an appt w/ DrBuccini for a colonoscopy!;  Her IC & FM have been quiescent;  She uses Flexeril for musc spasm & a rare Alpraz for nerves/ rest...     Problem List:    ASTHMATIC BRONCHITIS, ACUTE (ICD-466.0) - she gets 1-2 bouts of bronchitis yearly & they are "hard to shake"- requiring Medrol, Antibiotics, Mucinex, etc... prev doing well - w/o cough, sputum, hemoptysis, worsening dyspnea, wheezing, chest  pains, snoring, daytime hypersomnolence, etc...  ~  6/11:  she credits improvement on an herbal supplement= Astralagus.  Hx of ABNORMAL ELECTROCARDIOGRAM (ICD-794.31) - seen by [name] in 2001... she is asymptomatic and denies CP, palipit, dizziness, syncope, dyspnea, edema, etc...  ~  baseline EKG w/ poor R progression V1-3, diffuse SSTTWA...  ~  2DEcho 8/01 w/ mild ant wall HK, norm EF, valves normal... ~  Cardiolite 8/01 showed norm perfusion, no ischemia, EF=61%... ~  f/u EKG remains abnormal> no change; and she remains asymptomatic.  HYPERCHOLESTEROLEMIA (ICD-272.0) - treated w/ diet + CRESTOR 10mg - 1/2 daily + CoQ10 100mg /d... ~  TChol as high as 228, LDL up to 160 in 2007 ~  FLP 5/08 showed TChol 206, TG 62, HDL 50, LDL 132 ~  FLP 6/10 showed TChol 225, TG 99, HDL 56, LDL 157... rec> statin Rx- she initially declined but agreed to try Crestor5mg  w/ CoQ10... ~  FLP 6/11 on Cres5 showed TChol 150, TG 70, HDL 58, LDL 78 ~  FLP 6/12 on Cres5 showed TChol 165, TG 98, HDL 55, LDL 91  HYPOTHYROIDISM (ICD-244.9) - on SYNTHROID 20mcg/d... clinically euthyroid & taking meds daily. ~  TSH as high as 6.35 in 2007, started on Rx...  ~  TSH 5/08 = 4.75 on Rx... ~  labs 6/10 showed TSH= 4.99 ~  labs 6/11 on Levothy50 showed TSH=  3.46 ~  Labs 6/12 on Levothy50 showed TSH= 5.72 & we decided to incr the Levothy to 4mcg/d...  GI>> she has repeatedly refused screening colonoscopy... discussed again & she is requested to sched this important screening procedure. ~  6/12:  She tells me she has contacted DrBuccini & will   INTERSTITIAL CYSTITIS (ICD-595.1) - followed by [name] & she describes 2 problems- small bladder, & not emptying completely. ~  6/12:  She reports that voiding is improved & she is emptying more completely.Marland Kitchen  FIBROMYALGIA (ICD-729.1) - takes FLEXERIL 10mg Qhs...  ANXIETY (ICD-300.00) w/ hx claustraphobia & uses ALPRAZOLAM 0.25mg  Tid Prn...  HEALTH MAINTENANCE:  prev seen by  [facility] for GYN.Marland Kitchen.  now she sees [name]- yearly f/u & weaning Estradiol... [facility]/ [facility] son[facility] last yr & f/u due soon...  she had BMD done for baseline= normal by her report (mother had osteoporosis & she "doesn't want to get that")...  ~  Immunizations:  She is encourged to get the yearly flu vaccines;  Given TDAP 6/12...   Past Surgical History  Procedure Date  . Total abdominal hysterectomy 1997    Outpatient Encounter Prescriptions as of 12/03/2010  Medication Sig Dispense Refill  . ALPRAZolam (XANAX) 0.25 MG tablet 1/2 - 1 once daily as needed anxiety       . aspirin 81 MG tablet Take 81 mg by mouth daily.        . calcium carbonate (OS-CAL) 600 MG TABS Take 3 tablets by mouth daily.        . Cholecalciferol (VITAMIN D) 1000 UNITS capsule Take 1,000 Units by mouth daily.        . Coenzyme Q10 (COQ10) 100 MG CAPS Take 1 capsule by mouth daily.        . cyclobenzaprine (FLEXERIL) 10 MG tablet Take 10 mg by mouth daily.        Marland Kitchen estradiol (ESTRACE) 1 MG tablet Take 0.5 mg by mouth every other day.        . levothyroxine (SYNTHROID, LEVOTHROID) 50 MCG tablet Take 50 mcg by mouth daily.    ==> dose incr to 69mcg/d    . Multiple Vitamins-Minerals (WOMENS MULTIVITAMIN PLUS PO) Take 1 tablet by mouth daily.        . rosuvastatin (CRESTOR) 10 MG tablet Take 5 mg by mouth daily.    takes 1/2 tab daily    . vitamin C (ASCORBIC ACID) 500 MG tablet Take 500 mg by mouth daily.        . vitamin E 400 UNIT capsule Take 400 Units by mouth daily.          Allergies  Allergen Reactions  . Iodine   . Sulfonamide Derivatives     REACTION: throat swelling and itching    Current Medications, Allergies, Past Medical History, Past Surgical History, Family History, and Social History were reviewed in [employer] record.   Review of Systems         The patient complains of malaise, dyspnea on exertion, gas/bloating, urinary  frequency, incontinence, muscle cramps, and anxiety.  The patient denies fever, chills, sweats, anorexia, fatigue, weakness, weight loss, sleep disorder, blurring, diplopia, eye irritation, eye discharge, vision loss, eye pain, photophobia, earache, ear discharge, tinnitus, decreased hearing, nasal congestion, nosebleeds, sore throat, hoarseness, chest pain, palpitations, syncope, orthopnea, PND, peripheral edema, cough, dyspnea at rest, excessive sputum, hemoptysis, wheezing, pleurisy, nausea, vomiting, diarrhea, constipation, change in bowel habits, abdominal pain, melena,  hematochezia, jaundice, indigestion/heartburn, dysphagia, odynophagia, dysuria, hematuria, urinary hesitancy, nocturia, back pain, joint pain, joint swelling, muscle weakness, stiffness, arthritis, sciatica, restless legs, leg pain at night, leg pain with exertion, rash, itching, dryness, suspicious lesions, paralysis, paresthesias, seizures, tremors, vertigo, transient blindness, frequent falls, frequent headaches, difficulty walking, depression, memory loss, confusion, cold intolerance, heat intolerance, polydipsia, polyphagia, polyuria, unusual weight change, abnormal bruising, bleeding, enlarged lymph nodes, urticaria, allergic rash, hay fever, and recurrent infections.    Objective:   Physical Exam     WD, WN, 60 y/o WF in NAD... GENERAL:  Alert & oriented; pleasant & cooperative... HEENT:  [place]/AT, EOM-wnl, PERRLA, Fundi-benign, EACs-clear, TMs-wnl, NOSE-sl red, THROAT-clear & wnl. NECK:  Supple w/ sl decrROM; no JVD; normal carotid impulses w/o bruits; no thyromegaly or nodules palpated; no lymphadenopathy. CHEST:  Clear to P & A; without wheezes/ rales/ or rhonchi. HEART:  Regular Rhythm; without murmurs/ rubs/ or gallops. ABDOMEN:  Soft & nontender; normal bowel sounds; no organomegaly or masses detected. EXT: without deformities or arthritic changes; no varicose veins/ venous insuffic/ or edema. +trigger points  bilat... NEURO:  CN's intact; motor testing normal; sensory testing normal; gait normal & balance OK. DERM:  No lesions noted; no rash etc...   Assessment & Plan:   AB>  Stable w/o recent infectious exac...  HYPERCHOL>  Stable on diet & crestor 5mg /d...  HYPOTHYROID>  TSH is sl elev on Synthroid 71mcg/d; she assures me that she is taking it daily, therefore rec incr to 12mcg/d...  GI>  She has set up a screening colonoscopy w/ DrBuccini soon...  Interstitial Cystitis>  Symptoms have been minimized & she is doing better overall...  FM>  She uses Flexeril prn...  Anxiety>  Stable on an occas Alpraz.Marland KitchenMarland Kitchen

## 2010-12-05 ENCOUNTER — Encounter: Payer: Self-pay | Admitting: Pulmonary Disease

## 2011-01-05 ENCOUNTER — Ambulatory Visit (INDEPENDENT_AMBULATORY_CARE_PROVIDER_SITE_OTHER): Payer: BC Managed Care – PPO | Admitting: Adult Health

## 2011-01-05 ENCOUNTER — Other Ambulatory Visit: Payer: Self-pay | Admitting: Obstetrics and Gynecology

## 2011-01-05 ENCOUNTER — Encounter: Payer: Self-pay | Admitting: Adult Health

## 2011-01-05 VITALS — BP 122/80 | HR 84 | Temp 97.4°F | Ht 63.0 in | Wt 121.0 lb

## 2011-01-05 DIAGNOSIS — J209 Acute bronchitis, unspecified: Secondary | ICD-10-CM

## 2011-01-05 DIAGNOSIS — Z1231 Encounter for screening mammogram for malignant neoplasm of breast: Secondary | ICD-10-CM

## 2011-01-05 MED ORDER — CEFDINIR 300 MG PO CAPS: 300.0000 mg | ORAL_CAPSULE | Freq: Two times a day (BID) | ORAL | Status: AC

## 2011-01-05 MED ORDER — BENZONATATE 200 MG PO CAPS: 200.0000 mg | ORAL_CAPSULE | Freq: Three times a day (TID) | ORAL | Status: AC | PRN

## 2011-01-05 NOTE — Progress Notes (Signed)
Subjective:    Patient ID: Jaclyn Bennett, female    DOB: May 17, 1951, 60 y.o.   MRN: [id number]  HPI 60 y/o WF   ~  Jun10:  last seen by me 3/09 w/ refractory AB & responded to Medrol, Avelox, MMW, fluids, etc... she has seen TP on severe occas w/ sore throats, bronchitic infections, etc... overall doing reasonably well and here for a CPX today... she denies CP/ palpit/ SOB/ dizzy/ syncope/ edema/ etc...  ~  July 28, 2009:  she was seen by TP on several occas for upper resp tract infections and AB requiring Pred, Avelox, Mucinex, etc...  presents today w/ similar URI symptoms after exposure to grand daughter w/ the flu... pt c/o cough, congestion, feeling poorly, temp to 99.6 - all over the past 3-4 d... she states symptoms persist despite Mucinex & fluids, & that ZPak doesn't work for her- wants Avelox...  ~  November 27, 2009:  "I'm doing good" she says and credits the improvement a an immune-enhancer from the health food store- ASTRALAGUS- "it's an herb that helps the lungs & immune system" & notes no further AB exac etc... her Chol is OK on the Crestor & CoQ10;  Thyroid controlled on the Synthroid50;  bladder symptoms stable;  & FM/ Anxiety controlled w/ Flex & Alpraz...  ~  December 03, 2010:  Yearly ROV & she remains stable w/o new complaints or concerns;  Breathing well w/o intercurrent URI or AB exac;  Denies CP, palpit, SOB, edema;  FLP looks good on Cres5;  Thyroid appears sl under-replaced w/ Levo50 7 we will incr to 42mcg/d;  NOTE: she has made an appt w/ DrBuccini for a colonoscopy!;  Her IC & FM have been quiescent;  She uses Flexeril for musc spasm & a rare Alpraz for nerves/ rest...    01/05/2011 Acute OV  Complains of prod cough with green mucus, tightness in chest, wheezing, increased SOB x 2weeks. OTC not helping.  Was around someone at church with bronchitis Has her 2 grandchildren with her today.  Cough is getting worse.  Tessalon helps some.    Problem List:    ASTHMATIC  BRONCHITIS, ACUTE (ICD-466.0) - she gets 1-2 bouts of bronchitis yearly & they are "hard to shake"- requiring Medrol, Antibiotics, Mucinex, etc... prev doing well - w/o cough, sputum, hemoptysis, worsening dyspnea, wheezing, chest pains, snoring, daytime hypersomnolence, etc...  ~  6/11:  she credits improvement on an herbal supplement= Astralagus.  Hx of ABNORMAL ELECTROCARDIOGRAM (ICD-794.31) - seen by [name] in 2001... she is asymptomatic and denies CP, palipit, dizziness, syncope, dyspnea, edema, etc...  ~  baseline EKG w/ poor R progression V1-3, diffuse SSTTWA...  ~  2DEcho 8/01 w/ mild ant wall HK, norm EF, valves normal... ~  Cardiolite 8/01 showed norm perfusion, no ischemia, EF=61%... ~  f/u EKG remains abnormal> no change; and she remains asymptomatic.  HYPERCHOLESTEROLEMIA (ICD-272.0) - treated w/ diet + CRESTOR 10mg - 1/2 daily + CoQ10 100mg /d... ~  TChol as high as 228, LDL up to 160 in 2007 ~  FLP 5/08 showed TChol 206, TG 62, HDL 50, LDL 132 ~  FLP 6/10 showed TChol 225, TG 99, HDL 56, LDL 157... rec> statin Rx- she initially declined but agreed to try Crestor5mg  w/ CoQ10... ~  FLP 6/11 on Cres5 showed TChol 150, TG 70, HDL 58, LDL 78 ~  FLP 6/12 on Cres5 showed TChol 165, TG 98, HDL 55, LDL 91  HYPOTHYROIDISM (ICD-244.9) - on SYNTHROID 93mcg/d... clinically euthyroid &  taking meds daily. ~  TSH as high as 6.35 in 2007, started on Rx...  ~  TSH 5/08 = 4.75 on Rx... ~  labs 6/10 showed TSH= 4.99 ~  labs 6/11 on Levothy50 showed TSH= 3.46 ~  Labs 6/12 on Levothy50 showed TSH= 5.72 & we decided to incr the Levothy to 74mcg/d...  GI>> she has repeatedly refused screening colonoscopy... discussed again & she is requested to sched this important screening procedure. ~  6/12:  She tells me she has contacted DrBuccini & will   INTERSTITIAL CYSTITIS (ICD-595.1) - followed by [name] & she describes 2 problems- small bladder, & not emptying completely. ~  6/12:  She reports that  voiding is improved & she is emptying more completely.Marland Kitchen  FIBROMYALGIA (ICD-729.1) - takes FLEXERIL 10mg Qhs...  ANXIETY (ICD-300.00) w/ hx claustraphobia & uses ALPRAZOLAM 0.25mg  Tid Prn...  HEALTH MAINTENANCE:  prev seen by [facility] for GYN.Marland Kitchen.  now she sees [name]- yearly f/u & weaning Estradiol... [facility]/ [facility] son[facility] yr & f/u due soon...  she had BMD done for baseline= normal by her report (mother had osteoporosis & she "doesn't want to get that")...  ~  Immunizations:  She is encourged to get the yearly flu vaccines;  Given TDAP 6/12...   Past Surgical History  Procedure Date  . Total abdominal hysterectomy 1997    Outpatient Encounter Prescriptions as of 12/03/2010  Medication Sig Dispense Refill  . ALPRAZolam (XANAX) 0.25 MG tablet 1/2 - 1 once daily as needed anxiety       . aspirin 81 MG tablet Take 81 mg by mouth daily.        . calcium carbonate (OS-CAL) 600 MG TABS Take 3 tablets by mouth daily.        . Cholecalciferol (VITAMIN D) 1000 UNITS capsule Take 1,000 Units by mouth daily.        . Coenzyme Q10 (COQ10) 100 MG CAPS Take 1 capsule by mouth daily.        . cyclobenzaprine (FLEXERIL) 10 MG tablet Take 10 mg by mouth daily.        Marland Kitchen estradiol (ESTRACE) 1 MG tablet Take 0.5 mg by mouth every other day.        . levothyroxine (SYNTHROID, LEVOTHROID) 50 MCG tablet Take 50 mcg by mouth daily.    ==> dose incr to 48mcg/d    . Multiple Vitamins-Minerals (WOMENS MULTIVITAMIN PLUS PO) Take 1 tablet by mouth daily.        . rosuvastatin (CRESTOR) 10 MG tablet Take 5 mg by mouth daily.    takes 1/2 tab daily    . vitamin C (ASCORBIC ACID) 500 MG tablet Take 500 mg by mouth daily.        . vitamin E 400 UNIT capsule Take 400 Units by mouth daily.          Allergies  Allergen Reactions  . Iodine   . Sulfonamide Derivatives     REACTION: throat swelling and itching    Current Medications, Allergies, Past Medical History,  Past Surgical History, Family History, and Social History were reviewed in [employer] record.   Review of Systems          Constitutional:   No  weight loss, night sweats,  Fevers, chills, fatigue, or  lassitude.  HEENT:   No headaches,  Difficulty swallowing,  Tooth/dental problems, or  Sore throat,  No sneezing, itching, ear ache, nasal congestion, post nasal drip,   CV:  No chest pain,  Orthopnea, PND, swelling in lower extremities, anasarca, dizziness, palpitations, syncope.   GI  No heartburn, indigestion, abdominal pain, nausea, vomiting, diarrhea, change in bowel habits, loss of appetite, bloody stools.   Resp:   No coughing up of blood.   Marland Kitchen  No chest wall deformity  Skin: no rash or lesions.  GU: no dysuria, change in color of urine, no urgency or frequency.  No flank pain, no hematuria   MS:  No joint pain or swelling.  No decreased range of motion.  No back pain.  Psych:  No change in mood or affect. No depression or anxiety.  No memory loss.      Objective:   Physical Exam      WD, WN, 60 y/o WF in NAD... GENERAL:  Alert & oriented; pleasant & cooperative... HEENT:  [place]/AT, EOM-wnl,  EACs-clear, TMs-wnl, NOSE-sl red, THROAT-clear & wnl. NECK:  Supple w/ sl decrROM; no JVD; normal carotid impulses w/o bruits; no thyromegaly or nodules palpated; no lymphadenopathy. CHEST:  Clear to P & A; without wheezes/ rales/ or rhonchi. HEART:  Regular Rhythm; without murmurs/ rubs/ or gallops. ABDOMEN:  Soft & nontender; normal bowel sounds; no organomegaly or masses detected. EXT: without deformities or arthritic changes; no varicose veins/ venous insuffic/ or edema.  NEURO:    motor testing normal; sensory testing normal; gait normal & balance OK. DERM:  No lesions noted; no rash etc...   Assessment & Plan:

## 2011-01-05 NOTE — Assessment & Plan Note (Signed)
Omnicef 300mg  Twice daily  For 7 days with food, eat yogurt.  Mucinex DM Twice daily  As needed  Cough/congestion  Fluids and rest.  Gave you a refill of tessalon.  Please contact office for sooner follow up if symptoms do not improve or worsen or seek emergency care

## 2011-01-05 NOTE — Patient Instructions (Signed)
Omnicef 300mg Twice daily  For 7 days with food, eat yogurt.  Mucinex DM Twice daily  As needed  Cough/congestion  Fluids and rest.  Gave you a refill of tessalon.  Please contact office for sooner follow up if symptoms do not improve or worsen or seek emergency care    

## 2011-02-09 ENCOUNTER — Ambulatory Visit
Admission: RE | Admit: 2011-02-09 | Discharge: 2011-02-09 | Disposition: A | Discharge status: Home / Self Care | Payer: BC Managed Care – PPO | Source: Ambulatory Visit | Attending: Obstetrics and Gynecology | Admitting: Obstetrics and Gynecology

## 2011-02-09 DIAGNOSIS — Z1231 Encounter for screening mammogram for malignant neoplasm of breast: Secondary | ICD-10-CM

## 2011-02-17 ENCOUNTER — Telehealth: Payer: Self-pay | Admitting: Adult Health

## 2011-02-17 MED ORDER — HYDROCODONE-HOMATROPINE 5-1.5 MG/5ML PO SYRP: ORAL_SOLUTION | ORAL | Status: DC

## 2011-02-17 MED ORDER — AMOXICILLIN-POT CLAVULANATE 875-125 MG PO TABS: 1.0000 | ORAL_TABLET | Freq: Two times a day (BID) | ORAL | Status: AC

## 2011-02-17 NOTE — Telephone Encounter (Signed)
Spoke with the pt and she states x 3 days she has increased productive cough with green phlegm, "hurting in her chest," ear pain, PND, nasal congestion, fatigue, and increased SOB x 3 days. She states she has been taking mucinex, MMW, and benzonatate without relief. Pt refused an appt and request rx be sent. Please advise. Carron Curie, CMA Allergies  Allergen Reactions  . Iodine   . Sulfonamide Derivatives     REACTION: throat swelling and itching

## 2011-02-17 NOTE — Telephone Encounter (Signed)
Spoke with pt and notified of recs per TP. Pt verbalized understanding and denied any further questions. Rxs were called to pharm.

## 2011-02-17 NOTE — Telephone Encounter (Signed)
Augmentin 875mg  Twice daily  #14 take with food, eat yogurt  No refills, mucinex dm As needed   Fluids and rest  Hydromet #8 oz 1-2 tsp every 4-6 hr As needed  Cough, no refills, will cause sedation. Use with caution.  Please contact office for sooner follow up if symptoms do not improve or worsen or seek emergency care

## 2011-03-05 ENCOUNTER — Ambulatory Visit: Payer: BC Managed Care – PPO | Admitting: Adult Health

## 2011-03-05 ENCOUNTER — Telehealth: Payer: Self-pay | Admitting: Pulmonary Disease

## 2011-03-05 MED ORDER — AMOXICILLIN-POT CLAVULANATE 875-125 MG PO TABS: 1.0000 | ORAL_TABLET | Freq: Two times a day (BID) | ORAL | Status: DC

## 2011-03-05 NOTE — Telephone Encounter (Signed)
Pt aware of TP's rec's and aware antibiotic sent to the pharmacy.

## 2011-03-05 NOTE — Telephone Encounter (Signed)
Chelle B CALLED TO ASK THAT NURSE CALL HER AT [phone]. Hazel Sams

## 2011-03-05 NOTE — Telephone Encounter (Signed)
Augmentin 875mg  Twice daily  For 7 days , take with food, eat yogurt  #14 , no refills  Mucinex DM Twice daily  As needed  Cough/congestion  Fluids and rest  Please contact office for sooner follow up if symptoms do not improve or worsen or seek emergency care

## 2011-03-05 NOTE — Telephone Encounter (Signed)
Spoke with the pt and she states she did get better after seeing TP on 01-05-11. 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 She states she then had to stay in the air conditioning x 1.5 hours and thinks this made her sick.  She states she now has productive cough with green phlegm, chest tightness, hoarseness, and ear pain. The pt refuses appt stating she has to watch her grandson. She is requesting an RX for abx. Please advise.Carron Curie, CMA Allergies  Allergen Reactions  . Iodine   . Sulfonamide Derivatives     REACTION: throat swelling and itching

## 2011-03-23 ENCOUNTER — Telehealth: Payer: Self-pay | Admitting: Pulmonary Disease

## 2011-03-23 ENCOUNTER — Ambulatory Visit (INDEPENDENT_AMBULATORY_CARE_PROVIDER_SITE_OTHER): Payer: BC Managed Care – PPO | Admitting: Adult Health

## 2011-03-23 ENCOUNTER — Other Ambulatory Visit: Payer: BC Managed Care – PPO

## 2011-03-23 ENCOUNTER — Encounter: Payer: Self-pay | Admitting: Adult Health

## 2011-03-23 VITALS — BP 130/80 | HR 81 | Temp 97.2°F | Ht 63.0 in | Wt 121.6 lb

## 2011-03-23 DIAGNOSIS — J029 Acute pharyngitis, unspecified: Secondary | ICD-10-CM

## 2011-03-23 DIAGNOSIS — J069 Acute upper respiratory infection, unspecified: Secondary | ICD-10-CM

## 2011-03-23 LAB — BETA STREP SCREEN: Streptococcus, Group A Screen (Direct): NEGATIVE

## 2011-03-23 NOTE — Telephone Encounter (Signed)
Called, spoke with pt.  States she is having trouble swallowing because it "feels like there is a knot on the right side."  Also, states it is sore under her chin. This started last week but states she was on an abx and it seemed to go away but has now restarted yesterday and seemed worse this am.  OV scheduled with TP for today at 4:15pm -- pt aware.

## 2011-03-23 NOTE — Patient Instructions (Signed)
Majc Mouthwash As needed  Sore throat Salt water gargles as needed Zyrtec At bedtime for 5 days  Please contact office for sooner follow up if symptoms do not improve or worsen or seek emergency care

## 2011-03-23 NOTE — Telephone Encounter (Signed)
Pt stated she can now be reached at [phone].  Jaclyn Bennett

## 2011-03-23 NOTE — Progress Notes (Signed)
Subjective:    Patient ID: Jaclyn Bennett, female    DOB: [birth year], 60 y.o.   MRN: [id number]  HPI 60 y/o WF   ~  Jun10:  last seen by me 3/09 w/ refractory AB & responded to Medrol, Avelox, MMW, fluids, etc... she has seen TP on severe occas w/ sore throats, bronchitic infections, etc... overall doing reasonably well and here for a CPX today... she denies CP/ palpit/ SOB/ dizzy/ syncope/ edema/ etc...  ~  July 28, 2009:  she was seen by TP on several occas for upper resp tract infections and AB requiring Pred, Avelox, Mucinex, etc...  presents today w/ similar URI symptoms after exposure to grand daughter w/ the flu... pt c/o cough, congestion, feeling poorly, temp to 99.6 - all over the past 3-4 d... she states symptoms persist despite Mucinex & fluids, & that ZPak doesn't work for her- wants Avelox...  ~  November 27, 2009:  "I'm doing good" she says and credits the improvement a an immune-enhancer from the health food store- ASTRALAGUS- "it's an herb that helps the lungs & immune system" & notes no further AB exac etc... her Chol is OK on the Crestor & CoQ10;  Thyroid controlled on the Synthroid50;  bladder symptoms stable;  & FM/ Anxiety controlled w/ Flex & Alpraz...  ~  December 03, 2010:  Yearly ROV & she remains stable w/o new complaints or concerns;  Breathing well w/o intercurrent URI or AB exac;  Denies CP, palpit, SOB, edema;  FLP looks good on Cres5;  Thyroid appears sl under-replaced w/ Levo50 7 we will incr to 35mcg/d;  NOTE: she has made an appt w/ DrBuccini for a colonoscopy!;  Her IC & FM have been quiescent;  She uses Flexeril for musc spasm & a rare Alpraz for nerves/ rest...    01/05/11  Acute OV  Complains of prod cough with green mucus, tightness in chest, wheezing, increased SOB x 2weeks. OTC not helping.  Was around someone at church with bronchitis Has her 2 grandchildren with her today.  Cough is getting worse.  Tessalon helps some.  >>abx rx   03/23/2011 Acute OV    Complains that over last week has had a sore throat , painful to swallow. Has been exposed to strep throat. Had a bronchitis 3  Weeks ago, tx w/ augmentin w/ resolution. SHe complains of persistent drainage and ear full/ness. 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  No discolored mucus or fever  . No overt reflux or chest pain .    Problem List:    ASTHMATIC BRONCHITIS, ACUTE (ICD-466.0) - she gets 1-2 bouts of bronchitis yearly & they are "hard to shake"- requiring Medrol, Antibiotics, Mucinex, etc... prev doing well - w/o cough, sputum, hemoptysis, worsening dyspnea, wheezing, chest pains, snoring, daytime hypersomnolence, etc...  ~  6/11:  she credits improvement on an herbal supplement= Astralagus.  Hx of ABNORMAL ELECTROCARDIOGRAM (ICD-794.31) - seen by [name] in 2001... she is asymptomatic and denies CP, palipit, dizziness, syncope, dyspnea, edema, etc...  ~  baseline EKG w/ poor R progression V1-3, diffuse SSTTWA...  ~  2DEcho 8/01 w/ mild ant wall HK, norm EF, valves normal... ~  Cardiolite 8/01 showed norm perfusion, no ischemia, EF=61%... ~  f/u EKG remains abnormal> no change; and she remains asymptomatic.  HYPERCHOLESTEROLEMIA (ICD-272.0) - treated w/ diet + CRESTOR 10mg - 1/2 daily + CoQ10 100mg /d... ~  TChol as high as 228, LDL up to 160  in 2007 ~  FLP 5/08 showed TChol 206, TG 62, HDL 50, LDL 132 ~  FLP 6/10 showed TChol 225, TG 99, HDL 56, LDL 157... rec> statin Rx- she initially declined but agreed to try Crestor5mg  w/ CoQ10... ~  FLP 6/11 on Cres5 showed TChol 150, TG 70, HDL 58, LDL 78 ~  FLP 6/12 on Cres5 showed TChol 165, TG 98, HDL 55, LDL 91  HYPOTHYROIDISM (ICD-244.9) - on SYNTHROID 42mcg/d... clinically euthyroid & taking meds daily. ~  TSH as high as 6.35 in 2007, started on Rx...  ~  TSH 5/08 = 4.75 on Rx... ~  labs 6/10 showed TSH= 4.99 ~  labs 6/11 on Levothy50 showed TSH= 3.46 ~  Labs 6/12 on Levothy50 showed TSH= 5.72 & we  decided to incr the Levothy to 56mcg/d...  GI>> she has repeatedly refused screening colonoscopy... discussed again & she is requested to sched this important screening procedure. ~  6/12:  She tells me she has contacted DrBuccini & will   INTERSTITIAL CYSTITIS (ICD-595.1) - followed by [name] & she describes 2 problems- small bladder, & not emptying completely. ~  6/12:  She reports that voiding is improved & she is emptying more completely.Marland Kitchen  FIBROMYALGIA (ICD-729.1) - takes FLEXERIL 10mg Qhs...  ANXIETY (ICD-300.00) w/ hx claustraphobia & uses ALPRAZOLAM 0.25mg  Tid Prn...  HEALTH MAINTENANCE:  prev seen by [facility] for GYN.Marland Kitchen.  now she sees [name]- yearly f/u & weaning Estradiol... [facility]/ [facility] son[facility] last yr & f/u due soon...  she had BMD done for baseline= normal by her report (mother had osteoporosis & she "doesn't want to get that")...  ~  Immunizations:  She is encourged to get the yearly flu vaccines;  Given TDAP 6/12...   Past Surgical History  Procedure Date  . Total abdominal hysterectomy 1997    Outpatient Encounter Prescriptions as of 12/03/2010  Medication Sig Dispense Refill  . ALPRAZolam (XANAX) 0.25 MG tablet 1/2 - 1 once daily as needed anxiety       . aspirin 81 MG tablet Take 81 mg by mouth daily.        . calcium carbonate (OS-CAL) 600 MG TABS Take 3 tablets by mouth daily.        . Cholecalciferol (VITAMIN D) 1000 UNITS capsule Take 1,000 Units by mouth daily.        . Coenzyme Q10 (COQ10) 100 MG CAPS Take 1 capsule by mouth daily.        . cyclobenzaprine (FLEXERIL) 10 MG tablet Take 10 mg by mouth daily.        Marland Kitchen estradiol (ESTRACE) 1 MG tablet Take 0.5 mg by mouth every other day.        . levothyroxine (SYNTHROID, LEVOTHROID) 50 MCG tablet Take 50 mcg by mouth daily.    ==> dose incr to 64mcg/d    . Multiple Vitamins-Minerals (WOMENS MULTIVITAMIN PLUS PO) Take 1 tablet by mouth daily.        . rosuvastatin  (CRESTOR) 10 MG tablet Take 5 mg by mouth daily.    takes 1/2 tab daily    . vitamin C (ASCORBIC ACID) 500 MG tablet Take 500 mg by mouth daily.        . vitamin E 400 UNIT capsule Take 400 Units by mouth daily.          Allergies  Allergen Reactions  . Iodine   . Sulfonamide Derivatives     REACTION: throat swelling and  itching    Current Medications, Allergies, Past Medical History, Past Surgical History, Family History, and Social History were reviewed in [employer] record.   Review of Systems          Constitutional:   No  weight loss, night sweats,  Fevers, chills, fatigue, or  lassitude.  HEENT:   No headaches,  Difficulty swallowing,  Tooth/dental problems,                 No sneezing, itching, ear ache,    CV:  No chest pain,  Orthopnea, PND, swelling in lower extremities, anasarca, dizziness, palpitations, syncope.   GI  No heartburn, indigestion, abdominal pain, nausea, vomiting, diarrhea, change in bowel habits, loss of appetite, bloody stools.   Resp:   No coughing up of blood.   Marland Kitchen  No chest wall deformity  Skin: no rash or lesions.  GU: no dysuria, change in color of urine, no urgency or frequency.  No flank pain, no hematuria   MS:  No joint pain or swelling.  No decreased range of motion.  No back pain.  Psych:  No change in mood or affect. No depression or anxiety.  No memory loss.      Objective:   Physical Exam      WD, WN, 60 y/o WF in NAD... GENERAL:  Alert & oriented; pleasant & cooperative... HEENT:  [place]/AT, EOM-wnl,  EACs-clear, TMs-wnl, NOSE-sl red, THROAT-mild redness no exudate  NECK:  Supple w/ sl decrROM; no JVD; normal carotid impulses w/o bruits; no thyromegaly or nodules palpated; no lymphadenopathy., tender anteior cervical glands.  CHEST:  Clear to P & A; without wheezes/ rales/ or rhonchi. HEART:  Regular Rhythm; without murmurs/ rubs/ or gallops. ABDOMEN:  Soft & nontender; normal bowel sounds; no  organomegaly or masses detected. EXT: without deformities or arthritic changes; no varicose veins/ venous insuffic/ or edema.  NEURO:   ; gait normal & balance OK. DERM:  No lesions noted; no rash etc...   Assessment & Plan:

## 2011-03-23 NOTE — Assessment & Plan Note (Signed)
Recent URI now with lingering phayrngitis  Check strep test stat   Plan:  Majc Mouthwash As needed  Sore throat Salt water gargles as needed Zyrtec At bedtime for 5 days  Please contact office for sooner follow up if symptoms do not improve or worsen or seek emergency care

## 2011-03-23 NOTE — Telephone Encounter (Signed)
[email or web address]  

## 2011-03-23 NOTE — Telephone Encounter (Signed)
Pt returned Triage's call.  Jaclyn Bennett

## 2011-03-24 ENCOUNTER — Other Ambulatory Visit: Payer: Self-pay | Admitting: *Deleted

## 2011-03-24 MED ORDER — FIRST-DUKES MOUTHWASH MT SUSP: 5.0000 mL | Freq: Three times a day (TID) | OROMUCOSAL | Status: DC | PRN

## 2011-05-12 ENCOUNTER — Telehealth: Payer: Self-pay | Admitting: Adult Health

## 2011-05-12 MED ORDER — AMOXICILLIN-POT CLAVULANATE 875-125 MG PO TABS: 1.0000 | ORAL_TABLET | Freq: Two times a day (BID) | ORAL | Status: AC

## 2011-05-12 NOTE — Telephone Encounter (Signed)
Augmentin 849m Twice daily  For 7 days #14 , no refills  Cont w/ rest of her current therapy Please contact office for sooner follow up if symptoms do not improve or worsen or seek emergency care  Ov if not improving.  Take abx w/ food, eat yogurt

## 2011-05-12 NOTE — Telephone Encounter (Signed)
I spoke with pt and she c/o cough w/ green phlem, wheezing, chest tightness, drainage down back of the throat x couple days. Pt states her grand daughter lives with her and she was dx w/ bronchitis couple days ago and feels this is what she may have since they share the same bed. Pt states she has been using her MMW, taking mucinex DM, hydromet cough syrup, and her tessalon pearls during the day. Pt states she thinks she needs an abx called in for her. Pt requested I send this to TP. Please advise Tammy, thanks  Allergies  Allergen Reactions  . Iodine   . Sulfonamide Derivatives     REACTION: throat swelling and itching

## 2011-05-12 NOTE — Telephone Encounter (Signed)
Called and spoke with pt and she is aware per TP---ok to send in augmentin 875mg   #14  1 po bid x 7 days.  Take with food and can eat yogurt as well.  Pt was informed that if she is not getting better will need ov.  Pt is aware to call if she is not getting better or follow up with urgent care or er for emergency care.  Pt voiced her understanding of this.

## 2011-06-17 ENCOUNTER — Telehealth: Payer: Self-pay | Admitting: Pulmonary Disease

## 2011-06-17 MED ORDER — AMOXICILLIN-POT CLAVULANATE 875-125 MG PO TABS: 1.0000 | ORAL_TABLET | Freq: Two times a day (BID) | ORAL | Status: AC

## 2011-06-17 NOTE — Telephone Encounter (Signed)
I spoke with pt and she c/o chest tightness, chest congestion, nasal congestion, sore throat, PND, blowing out green phlem, coughing up green phlem, right ear pain x couple days. Pt denies any f/c/n/v. Pt has been using MMW, benzonatate, and mucinex DM x 2 days. Pt is wanting an ABX called into [facility]. Pt states they close at 6pm and needs it called in before then. Please advise Dr. Kriste Basque, thanks  Allergies  Allergen Reactions  . Iodine   . Sulfonamide Derivatives     REACTION: throat swelling and itching

## 2011-06-17 NOTE — Telephone Encounter (Signed)
Per SN---ok for pt to have augmentin 875mg   #14  1 po bid until gone.  This has been sent to the pts pharmacy.  Called and spoke with pt and she is aware of SN recs and that meds have been sent to the pharmacy.  Pt is aware to cont the mucinex 2 po bid and increase fluid intake.  Pt voiced her understanding of this.

## 2011-07-26 ENCOUNTER — Telehealth: Payer: Self-pay | Admitting: Pulmonary Disease

## 2011-07-26 MED ORDER — AZITHROMYCIN 250 MG PO TABS: ORAL_TABLET | ORAL | Status: AC

## 2011-07-26 NOTE — Telephone Encounter (Signed)
Per SN---ok for pt to have zpak #1  Take as directed with no refills.  Continue the mucinex 2 po bid with plenty of fluids and nasal saline mist as needed.

## 2011-07-26 NOTE — Telephone Encounter (Signed)
I spoke with Jaclyn Bennett and she c/o bilateral ears stopped up, nasal congestion, itch eyes, coughing up green phlem (only today), PND, right sided rib pain, sore throat x Friday. Denies any fever, chills, sweats, nausea, vomiting. Jaclyn Bennett is using mucinex DM, MMW, and tessalon pearls, Jaclyn Bennett is requesting to have an abx called into [facility]. Please advise Dr. Kriste Basque, thanks  Allergies  Allergen Reactions  . Iodine   . Sulfonamide Derivatives     REACTION: throat swelling and itching

## 2011-07-26 NOTE — Telephone Encounter (Signed)
Spoke with pt and notified of recs per SN Pt verbalized understanding and states nothing further needed Rx was sent to pharm 

## 2011-08-09 ENCOUNTER — Telehealth: Payer: Self-pay | Admitting: Pulmonary Disease

## 2011-08-09 MED ORDER — METHYLPREDNISOLONE 4 MG PO KIT: PACK | ORAL | Status: AC

## 2011-08-09 MED ORDER — AMOXICILLIN-POT CLAVULANATE 875-125 MG PO TABS: 1.0000 | ORAL_TABLET | Freq: Two times a day (BID) | ORAL | Status: AC

## 2011-08-09 NOTE — Telephone Encounter (Signed)
Pt c/o cough w/ green mucus, bilateral ear pain, sob and wheezing x 2-3 days. She says she sat in a meeting with cold air blowing xeveral days ago and now she is sick. She does have slight chest discomfort and hot and cold chills. Pls advise. Allergies  Allergen Reactions  . Iodine   . Sulfonamide Derivatives     REACTION: throat swelling and itching

## 2011-08-09 NOTE — Telephone Encounter (Signed)
Per Dr Kriste Basque Pt can have augmentin 875 mg 1 po bid #14 no refills  Medrol 4mg  dose pak for 6 days #21 no refills and also take align qd while on these  Medication. rx's sent to the [facility] pt is aware

## 2011-08-20 ENCOUNTER — Ambulatory Visit: Payer: BC Managed Care – PPO | Admitting: Adult Health

## 2011-08-20 ENCOUNTER — Telehealth: Payer: Self-pay | Admitting: Pulmonary Disease

## 2011-08-20 MED ORDER — TRAMADOL HCL 50 MG PO TABS: 50.0000 mg | ORAL_TABLET | ORAL | Status: DC | PRN

## 2011-08-20 NOTE — Telephone Encounter (Signed)
Called and spoke with pt and she is aware per SN---agree that its inflammation related---recs are for pt to rest, use heating pad, rx for tramadol 50mg   #30   1 po every 4 hours prn pain and take along with a tylenol.   She will need ENT eval--she can either call her ENT or we can set up an appt for her to see Dr. Felipa Evener stated that she will wait to see if she gets better over the weekend and if not she will call back next week.

## 2011-08-20 NOTE — Telephone Encounter (Signed)
PT finished Augmentin and Pred taper on Monday 08-16-11.  Pt now c/o pain or tightness between breasts.  Denies nausea or vomiting or pain in left arm or jaw.  Pt thinks this may be inflammation in chest wall.  PT also having sinus drainage (light Green).  Please advise.

## 2011-09-20 ENCOUNTER — Telehealth: Payer: Self-pay | Admitting: Pulmonary Disease

## 2011-09-20 ENCOUNTER — Ambulatory Visit (INDEPENDENT_AMBULATORY_CARE_PROVIDER_SITE_OTHER): Payer: BC Managed Care – PPO | Admitting: Adult Health

## 2011-09-20 ENCOUNTER — Encounter: Payer: Self-pay | Admitting: Adult Health

## 2011-09-20 DIAGNOSIS — B029 Zoster without complications: Secondary | ICD-10-CM | POA: Insufficient documentation

## 2011-09-20 MED ORDER — VALACYCLOVIR HCL 1 G PO TABS: 1000.0000 mg | ORAL_TABLET | Freq: Three times a day (TID) | ORAL | Status: AC

## 2011-09-20 MED ORDER — PREDNISONE 10 MG PO TABS: ORAL_TABLET | ORAL | Status: DC

## 2011-09-20 NOTE — Telephone Encounter (Signed)
I spoke with the pt and she states x 1 week she has had a sore on her buttocks. She states it is red and painful. She states it feels like it is a cluster of blisters. She states the area is larger then a quarter and has increased in size over the week. Pt set to see TP this pm at 3:30pm. Carron Curie, CMA

## 2011-09-20 NOTE — Progress Notes (Signed)
Subjective:    Patient ID: Jaclyn Bennett, female    DOB: Feb 04, 1951, 61 y.o.   MRN: [id number]  HPI 61  y/o WF   ~  Jun10:  last seen by me 3/09 w/ refractory AB & responded to Medrol, Avelox, MMW, fluids, etc... she has seen TP on severe occas w/ sore throats, bronchitic infections, etc... overall doing reasonably well and here for a CPX today... she denies CP/ palpit/ SOB/ dizzy/ syncope/ edema/ etc...  ~  July 28, 2009:  she was seen by TP on several occas for upper resp tract infections and AB requiring Pred, Avelox, Mucinex, etc...  presents today w/ similar URI symptoms after exposure to grand daughter w/ the flu... pt c/o cough, congestion, feeling poorly, temp to 99.6 - all over the past 3-4 d... she states symptoms persist despite Mucinex & fluids, & that ZPak doesn't work for her- wants Avelox...  ~  November 27, 2009:  "I'm doing good" she says and credits the improvement a an immune-enhancer from the health food store- ASTRALAGUS- "it's an herb that helps the lungs & immune system" & notes no further AB exac etc... her Chol is OK on the Crestor & CoQ10;  Thyroid controlled on the Synthroid50;  bladder symptoms stable;  & FM/ Anxiety controlled w/ Flex & Alpraz...  ~  December 03, 2010:  Yearly ROV & she remains stable w/o new complaints or concerns;  Breathing well w/o intercurrent URI or AB exac;  Denies CP, palpit, SOB, edema;  FLP looks good on Cres5;  Thyroid appears sl under-replaced w/ Levo50 7 we will incr to 23mcg/d;  NOTE: she has made an appt w/ DrBuccini for a colonoscopy!;  Her IC & FM have been quiescent;  She uses Flexeril for musc spasm & a rare Alpraz for nerves/ rest...    01/05/11  Acute OV  Complains of prod cough with green mucus, tightness in chest, wheezing, increased SOB x 2weeks. OTC not helping.  Was around someone at church with bronchitis Has her 2 grandchildren with her today.  Cough is getting worse.  Tessalon helps some.  >>abx rx   03/23/2011  Acute OV    Complains that over last week has had a sore throat , painful to swallow. Has been exposed to strep throat. Had a bronchitis 3  Weeks ago, tx w/ augmentin w/ resolution. SHe complains of persistent drainage and ear full/ness. 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  No discolored mucus or fever  . No overt reflux or chest pain .  >>MMW    09/20/2011 Acute OV  Presents for a work in visit today. Patient complains of a rash along the left side of her buttocks. Over the last week. Completed. She's had several little blisters that she has been trying to drain in place. Alcohol on. Has a burning sensation and pain that radiates into groin.  She denies any fever, chest pain, shortness of breath, nausea, vomiting, recent travel or new medications.  Problem List:    ASTHMATIC BRONCHITIS, ACUTE (ICD-466.0) - she gets 1-2 bouts of bronchitis yearly & they are "hard to shake"- requiring Medrol, Antibiotics, Mucinex, etc... prev doing well - w/o cough, sputum, hemoptysis, worsening dyspnea, wheezing, chest pains, snoring, daytime hypersomnolence, etc...  ~  6/11:  she credits improvement on an herbal supplement= Astralagus.  Hx of ABNORMAL ELECTROCARDIOGRAM (ICD-794.31) - seen by [name] in 2001... she is asymptomatic and denies CP, palipit, dizziness, syncope, dyspnea,  edema, etc...  ~  baseline EKG w/ poor R progression V1-3, diffuse SSTTWA...  ~  2DEcho 8/01 w/ mild ant wall HK, norm EF, valves normal... ~  Cardiolite 8/01 showed norm perfusion, no ischemia, EF=61%... ~  f/u EKG remains abnormal> no change; and she remains asymptomatic.  HYPERCHOLESTEROLEMIA (ICD-272.0) - treated w/ diet + CRESTOR 10mg - 1/2 daily + CoQ10 100mg /d... ~  TChol as high as 228, LDL up to 160 in 2007 ~  FLP 5/08 showed TChol 206, TG 62, HDL 50, LDL 132 ~  FLP 6/10 showed TChol 225, TG 99, HDL 56, LDL 157... rec> statin Rx- she initially declined but agreed to try Crestor5mg  w/ CoQ10... ~   FLP 6/11 on Cres5 showed TChol 150, TG 70, HDL 58, LDL 78 ~  FLP 6/12 on Cres5 showed TChol 165, TG 98, HDL 55, LDL 91  HYPOTHYROIDISM (ICD-244.9) - on SYNTHROID 24mcg/d... clinically euthyroid & taking meds daily. ~  TSH as high as 6.35 in 2007, started on Rx...  ~  TSH 5/08 = 4.75 on Rx... ~  labs 6/10 showed TSH= 4.99 ~  labs 6/11 on Levothy50 showed TSH= 3.46 ~  Labs 6/12 on Levothy50 showed TSH= 5.72 & we decided to incr the Levothy to 49mcg/d...  GI>> she has repeatedly refused screening colonoscopy... discussed again & she is requested to sched this important screening procedure. ~  6/12:  She tells me she has contacted DrBuccini & will   INTERSTITIAL CYSTITIS (ICD-595.1) - followed by [name] & she describes 2 problems- small bladder, & not emptying completely. ~  6/12:  She reports that voiding is improved & she is emptying more completely.Marland Kitchen  FIBROMYALGIA (ICD-729.1) - takes FLEXERIL 10mg Qhs...  ANXIETY (ICD-300.00) w/ hx claustraphobia & uses ALPRAZOLAM 0.25mg  Tid Prn...  HEALTH MAINTENANCE:  prev seen by [facility] for GYN.Marland Kitchen.  now she sees [name]- yearly f/u & weaning Estradiol... [facility]/ [facility] last yr & f/u due soon...  she had BMD done for baseline= normal by her report (mother had osteoporosis & she "doesn't want to get that")...  ~  Immunizations:  She is encourged to get the yearly flu vaccines;  Given TDAP 6/12...   Past Surgical History  Procedure Date  . Total abdominal hysterectomy 1997    Outpatient Encounter Prescriptions as of 12/03/2010  Medication Sig Dispense Refill  . ALPRAZolam (XANAX) 0.25 MG tablet 1/2 - 1 once daily as needed anxiety       . aspirin 81 MG tablet Take 81 mg by mouth daily.        . calcium carbonate (OS-CAL) 600 MG TABS Take 3 tablets by mouth daily.        . Cholecalciferol (VITAMIN D) 1000 UNITS capsule Take 1,000 Units by mouth daily.        . Coenzyme Q10 (COQ10) 100 MG CAPS  Take 1 capsule by mouth daily.        . cyclobenzaprine (FLEXERIL) 10 MG tablet Take 10 mg by mouth daily.        Marland Kitchen estradiol (ESTRACE) 1 MG tablet Take 0.5 mg by mouth every other day.        . levothyroxine (SYNTHROID, LEVOTHROID) 50 MCG tablet Take 50 mcg by mouth daily.    ==> dose incr to 34mcg/d    . Multiple Vitamins-Minerals (WOMENS MULTIVITAMIN PLUS PO) Take 1 tablet by mouth daily.        . rosuvastatin (CRESTOR) 10 MG tablet Take 5  mg by mouth daily.    takes 1/2 tab daily    . vitamin C (ASCORBIC ACID) 500 MG tablet Take 500 mg by mouth daily.        . vitamin E 400 UNIT capsule Take 400 Units by mouth daily.          Allergies  Allergen Reactions  . Iodine   . Sulfonamide Derivatives     REACTION: throat swelling and itching    Current Medications, Allergies, Past Medical History, Past Surgical History, Family History, and Social History were reviewed in [employer] record.   Review of Systems          Constitutional:   No  weight loss, night sweats,  Fevers, chills, fatigue, or  lassitude.  HEENT:   No headaches,  Difficulty swallowing,  Tooth/dental problems,                 No sneezing, itching, ear ache,    CV:  No chest pain,  Orthopnea, PND, swelling in lower extremities, anasarca, dizziness, palpitations, syncope.   GI  No heartburn, indigestion, abdominal pain, nausea, vomiting, diarrhea, change in bowel habits, loss of appetite, bloody stools.   Resp:   No coughing up of blood.   Marland Kitchen  No chest wall deformity  Skin: rash++  GU: no dysuria, change in color of urine, no urgency or frequency.  No flank pain, no hematuria   MS:  No joint pain or swelling.  No decreased range of motion.  No back pain.  Psych:  No change in mood or affect. No depression or anxiety.  No memory loss.       Objective:   Physical Exam      WD, WN, 61 y/o WF in NAD... GENERAL:  Alert & oriented; pleasant & cooperative... HEENT:  [place]/AT,  TMs-wnl,  NOSE-sl red, THROAT-mild redness no exudate  NECK:  Supple  ; no JVD; normal carotid impulses w/o bruits; no thyromegaly or nodules palpated; no lymphadenopathy., tender anteior cervical glands.  CHEST:  Clear to P & A; without wheezes/ rales/ or rhonchi. HEART:  Regular Rhythm; without murmurs/ rubs/ or gallops. ABDOMEN:  Soft & nontender; normal bowel sounds; no organomegaly or masses detected. EXT: without deformities or arthritic changes; no varicose veins/ venous insuffic/ or edema.  NEURO:   ; gait normal & balance OK. DERM: along left inner buttock small scattered blisters   Assessment & Plan:

## 2011-09-20 NOTE — Patient Instructions (Signed)
Valtrex 1 gram Three times a day  For 7 days  Prednisone taper over next week.  Tylenol As needed   Please contact office for sooner follow up if symptoms do not improve or worsen or seek emergency care   

## 2011-09-20 NOTE — Assessment & Plan Note (Signed)
Valtrex 1 gram Three times a day  For 7 days  Prednisone taper over next week.  Tylenol As needed   Please contact office for sooner follow up if symptoms do not improve or worsen or seek emergency care

## 2011-10-31 ENCOUNTER — Telehealth: Payer: Self-pay | Admitting: Pulmonary Disease

## 2011-10-31 MED ORDER — LEVOFLOXACIN 750 MG PO TABS: 750.0000 mg | ORAL_TABLET | Freq: Every day | ORAL | Status: AC

## 2011-10-31 NOTE — Telephone Encounter (Signed)
Pt calls with 2 day h/o sinus and chest congestion, cough with purulent green mucus.  No fever or increased sob.   She feels she is getting worse.  Will call in abx for her, but to make appt with Kriste Basque if she does not get better.

## 2011-11-22 ENCOUNTER — Telehealth: Payer: Self-pay | Admitting: Pulmonary Disease

## 2011-11-22 MED ORDER — LEVOTHYROXINE SODIUM 75 MCG PO TABS: 75.0000 ug | ORAL_TABLET | Freq: Every day | ORAL | Status: DC

## 2011-11-22 NOTE — Telephone Encounter (Signed)
I spoke with pt and is aware rx has been sent and nothing further was needed 

## 2011-12-06 ENCOUNTER — Telehealth: Payer: Self-pay | Admitting: Pulmonary Disease

## 2011-12-06 ENCOUNTER — Other Ambulatory Visit: Payer: Self-pay | Admitting: Pulmonary Disease

## 2011-12-06 DIAGNOSIS — Z0000 Encounter for general adult medical examination without abnormal findings: Secondary | ICD-10-CM

## 2011-12-06 NOTE — Telephone Encounter (Signed)
Please advise SN THANKS

## 2011-12-06 NOTE — Telephone Encounter (Signed)
Orders have been placed in the computer for the pt to come in for fasting labs.

## 2011-12-07 ENCOUNTER — Other Ambulatory Visit (INDEPENDENT_AMBULATORY_CARE_PROVIDER_SITE_OTHER): Payer: BC Managed Care – PPO

## 2011-12-07 DIAGNOSIS — Z0000 Encounter for general adult medical examination without abnormal findings: Secondary | ICD-10-CM

## 2011-12-07 LAB — HEPATIC FUNCTION PANEL
ALT: 30 U/L (ref 0–35)
AST: 26 U/L (ref 0–37)
Alkaline Phosphatase: 88 U/L (ref 39–117)
Bilirubin, Direct: 0.1 mg/dL (ref 0.0–0.3)
Total Protein: 7 g/dL (ref 6.0–8.3)

## 2011-12-07 LAB — BASIC METABOLIC PANEL
CO2: 29 mEq/L (ref 19–32)
Calcium: 9.3 mg/dL (ref 8.4–10.5)
Chloride: 103 mEq/L (ref 96–112)
Glucose, Bld: 96 mg/dL (ref 70–99)
Potassium: 4.1 mEq/L (ref 3.5–5.1)
Sodium: 141 mEq/L (ref 135–145)

## 2011-12-07 LAB — CBC WITH DIFFERENTIAL/PLATELET
Basophils Relative: 0.4 % (ref 0.0–3.0)
Eosinophils Relative: 3 % (ref 0.0–5.0)
HCT: 40.1 % (ref 36.0–46.0)
Lymphs Abs: 1.5 10*3/uL (ref 0.7–4.0)
MCV: 93.4 fl (ref 78.0–100.0)
Monocytes Absolute: 0.5 10*3/uL (ref 0.1–1.0)
Monocytes Relative: 9.1 % (ref 3.0–12.0)
Neutrophils Relative %: 56.6 % (ref 43.0–77.0)
RBC: 4.29 Mil/uL (ref 3.87–5.11)
WBC: 5 10*3/uL (ref 4.5–10.5)

## 2011-12-07 LAB — LIPID PANEL: Total CHOL/HDL Ratio: 3

## 2011-12-08 LAB — VITAMIN D 25 HYDROXY (VIT D DEFICIENCY, FRACTURES): Vit D, 25-Hydroxy: 54 ng/mL (ref 30–89)

## 2011-12-09 ENCOUNTER — Encounter: Payer: Self-pay | Admitting: Pulmonary Disease

## 2011-12-09 ENCOUNTER — Ambulatory Visit (INDEPENDENT_AMBULATORY_CARE_PROVIDER_SITE_OTHER): Payer: BC Managed Care – PPO | Admitting: Pulmonary Disease

## 2011-12-09 VITALS — BP 120/70 | HR 95 | Temp 97.1°F | Ht 63.0 in | Wt 122.2 lb

## 2011-12-09 DIAGNOSIS — E7800 Pure hypercholesterolemia, unspecified: Secondary | ICD-10-CM

## 2011-12-09 DIAGNOSIS — F411 Generalized anxiety disorder: Secondary | ICD-10-CM

## 2011-12-09 DIAGNOSIS — IMO0001 Reserved for inherently not codable concepts without codable children: Secondary | ICD-10-CM

## 2011-12-09 DIAGNOSIS — N3010 Interstitial cystitis (chronic) without hematuria: Secondary | ICD-10-CM

## 2011-12-09 DIAGNOSIS — Z0000 Encounter for general adult medical examination without abnormal findings: Secondary | ICD-10-CM

## 2011-12-09 DIAGNOSIS — E039 Hypothyroidism, unspecified: Secondary | ICD-10-CM

## 2011-12-09 MED ORDER — LEVOTHYROXINE SODIUM 75 MCG PO TABS: 75.0000 ug | ORAL_TABLET | Freq: Every day | ORAL | Status: DC

## 2011-12-09 MED ORDER — ALPRAZOLAM 0.25 MG PO TABS: ORAL_TABLET | ORAL | Status: DC

## 2011-12-09 MED ORDER — ROSUVASTATIN CALCIUM 10 MG PO TABS: ORAL_TABLET | ORAL | Status: DC

## 2011-12-09 MED ORDER — CYCLOBENZAPRINE HCL 10 MG PO TABS: 10.0000 mg | ORAL_TABLET | Freq: Every day | ORAL | Status: DC

## 2011-12-09 MED ORDER — FIRST-DUKES MOUTHWASH MT SUSP: 5.0000 mL | Freq: Three times a day (TID) | OROMUCOSAL | Status: DC | PRN

## 2011-12-09 MED ORDER — BENZONATATE 100 MG PO CAPS
100.0000 mg | ORAL_CAPSULE | Freq: Four times a day (QID) | ORAL | Status: AC | PRN
Start: 2011-12-09 — End: 2011-12-16

## 2011-12-09 NOTE — Progress Notes (Signed)
Subjective:    Patient ID: Jaclyn Bennett, female    DOB: Oct 22, 1950, 61 y.o.   MRN: [id number]  HPI 61 y/o WF here for a follow up visit & annual CPX...  ~  Jun10:  last seen by me 3/09 w/ refractory AB & responded to Medrol, Avelox, MMW, fluids, etc... she has seen TP on severe occas w/ sore throats, bronchitic infections, etc... overall doing reasonably well and here for a CPX today... she denies CP/ palpit/ SOB/ dizzy/ syncope/ edema/ etc...  ~  July 28, 2009:  she was seen by TP on several occas for upper resp tract infections and AB requiring Pred, Avelox, Mucinex, etc...  presents today w/ similar URI symptoms after exposure to grand daughter w/ the flu... pt c/o cough, congestion, feeling poorly, temp to 99.6 - all over the past 3-4 d... she states symptoms persist despite Mucinex & fluids, & that ZPak doesn't work for her- wants Avelox...  ~  November 27, 2009:  "I'm doing good" she says and credits the improvement a an immune-enhancer from the health food store- ASTRALAGUS- "it's an herb that helps the lungs & immune system" & notes no further AB exac etc... her Chol is OK on the Crestor & CoQ10;  Thyroid controlled on the Synthroid50;  bladder symptoms stable;  & FM/ Anxiety controlled w/ Flex & Alpraz...  ~  December 03, 2010:  Yearly ROV & she remains stable w/o new complaints or concerns;  Breathing well w/o intercurrent URI or AB exac;  Denies CP, palpit, SOB, edema;  FLP looks good on Cres5;  Thyroid appears sl under-replaced w/ Levo50 7 we will incr to 45mcg/d;  NOTE: she has made an appt w/ DrBuccini for a colonoscopy!;  Her IC & FM have been quiescent;  She uses Flexeril for musc spasm & a rare Alpraz for nerves/ rest...    ~  December 09, 2011:  Yearly ROV & CPX> Jaclyn Bennett had Shingles in right S2-3 dermatome 4/13 & treated by TP w/ Valtrex & Pred taper; rash resolved & min resid paresthesias but no neuralgia; she has already checked w/ her Insurance Co & they DON'T cover the vaccine so she  wants to wait & think about it some more; she is more concerned over abn sensation/ paresthesia on right side of face, comes & goes, ?ppt factors, etc; no apparent weakness, droop, etc==> I told her this was not from Shingles & she would need Neurology eval but she doesn't want referral yet; under stress raising grandchild...  Breathing has been good- denies cough, sputum, SOB, CP, edema, etc;  She had labs done proir to the Physical & FLP looks good on Cres5, Thyroid good on Synth75, & resting well w/ Flex10mg Qhs... She tells me she finally had Colonoscopy 8/12 by DrBuccini> we do not have his notes or report, she says it was neg & rec f/u 66yrs...    We reviewed prob list, meds, xrays and labs> see below>> LABS 6/13:  FLP- all parameters at goals on Cres5;  Chems- all wnl;  CBC- all wnl;  TSH=2.06 on Synth75;  VitD=54 on 1000u/d...   Problem List:    ASTHMATIC BRONCHITIS, ACUTE (ICD-466.0) - she gets 1-2 bouts of bronchitis yearly & they are "hard to shake"- requiring Medrol, Antibiotics, Mucinex, etc... prev doing well - w/o cough, sputum, hemoptysis, worsening dyspnea, wheezing, chest pains, snoring, daytime hypersomnolence, etc...  ~  CXR 11/10 showed normal heart size, clear lungs, sl scoliosis, NAD... ~  6/11:  she  credits improvement on an herbal supplement= Astralagus.  Hx of ABNORMAL ELECTROCARDIOGRAM (ICD-794.31) - seen by [name] in 2001... she is asymptomatic and denies CP, palipit, dizziness, syncope, dyspnea, edema, etc...  ~  baseline EKG w/ poor R progression V1-3, diffuse SSTTWA...  ~  2DEcho 8/01 w/ mild ant wall HK, norm EF, valves normal... ~  Cardiolite 8/01 showed norm perfusion, no ischemia, EF=61%... ~  f/u EKG 6/10 remains abnormal> no change; and she remains asymptomatic.  HYPERCHOLESTEROLEMIA (ICD-272.0) - treated w/ diet + CRESTOR 10mg - 1/2 daily + CoQ10 100mg /d... ~  TChol as high as 228, LDL up to 160 in 2007 ~  FLP 5/08 showed TChol 206, TG 62, HDL 50, LDL 132 ~   FLP 6/10 showed TChol 225, TG 99, HDL 56, LDL 157... rec> statin Rx- she initially declined but agreed to try Crestor5mg  w/ CoQ10... ~  FLP 6/11 on Cres5 showed TChol 150, TG 70, HDL 58, LDL 78 ~  FLP 6/12 on Cres5 showed TChol 165, TG 98, HDL 55, LDL 91 ~  FLP 6/13 on Cres5 showed TChol 139, TG 120, HDL 54, LDL 61  HYPOTHYROIDISM (ICD-244.9) - on SYNTHROID 3mcg/d... clinically euthyroid & taking meds daily. ~  TSH as high as 6.35 in 2007, started on Rx...  ~  TSH 5/08 = 4.75 on Rx... ~  labs 6/10 showed TSH= 4.99 ~  labs 6/11 on Levothy50 showed TSH= 3.46 ~  Labs 6/12 on Levothy50 showed TSH= 5.72 & we decided to incr the Levothy to 27mcg/d...  GI>> she had repeatedly refused screening colonoscopy... discussed again & she is requested to sched this important screening procedure==> she went to [employer], DrBuccini. ~  She reports that she had Colonoscopy 8/12 by DrBuccini & that it was neg w/ plan for recheck in 10 yrs; we do not have records & will call for this report.  INTERSTITIAL CYSTITIS (ICD-595.1) - followed by [name] & she describes 2 problems- small bladder, & not emptying completely. ~  6/12:  She reports that voiding is improved & she is emptying more completely.. ~  6/13:  She is largely asymptomatic & doing well...  FIBROMYALGIA (ICD-729.1) - takes FLEXERIL 10mg Qhs to help her rest & doing satis...  ANXIETY (ICD-300.00) w/ hx claustraphobia & uses ALPRAZOLAM 0.25mg  Tid Prn...  SHINGLES >> she had bout of Shingles involving the right S2-3 Dermatome 4/13 & treated by TP w/ Valtrex & Pred...  HEALTH MAINTENANCE:  prev seen by [facility] for GYN.Marland Kitchen.  now she sees [name]- yearly f/u & weaning Estradiol... [facility]/ [facility] sonar last yr & f/u due soon...  she had BMD done for baseline 11/11 & TScores +0.4 in Spine, and -0.9 in left [facility] (mother had osteoporosis & she "doesn't want to get that")...  ~  Immunizations:  She is  encourged to get the yearly flu vaccines;  Given TDAP 6/12... We discussed the Shingles vaccine but her insurance won't pay she says...   Past Surgical History  Procedure Date  . Total abdominal hysterectomy 1997    Outpatient Encounter Prescriptions as of 12/09/2011  Medication Sig Dispense Refill  . ALPRAZolam (XANAX) 0.25 MG tablet 1/2 - 1 once daily as needed anxiety  30 tablet  5  . aspirin 81 MG tablet Take 81 mg by mouth daily.        . calcium carbonate (OS-CAL) 600 MG TABS Take 3 tablets by mouth daily.        . Cholecalciferol (  VITAMIN D) 1000 UNITS capsule Take 1,000 Units by mouth daily.        . Coenzyme Q10 (COQ10) 100 MG CAPS Take 1 capsule by mouth daily.        . cyclobenzaprine (FLEXERIL) 10 MG tablet Take 10 mg by mouth daily.        Marland Kitchen estradiol (ESTRACE) 1 MG tablet Take 0.5 mg by mouth every other day.        . levothyroxine (SYNTHROID, LEVOTHROID) 75 MCG tablet Take 1 tablet (75 mcg total) by mouth daily.  30 tablet  0  . Multiple Vitamins-Minerals (WOMENS MULTIVITAMIN PLUS PO) Take 1 tablet by mouth daily.        . rosuvastatin (CRESTOR) 10 MG tablet Take 5 mg by mouth daily. Take as directed       . vitamin C (ASCORBIC ACID) 500 MG tablet Take 500 mg by mouth daily.        . vitamin E 400 UNIT capsule Take 400 Units by mouth daily.        . Diphenhyd-Hydrocort-Nystatin (FIRST-DUKES MOUTHWASH) SUSP Use as directed 5 mLs in the mouth or throat 3 (three) times daily as needed.  120 mL  6  . DISCONTD: HYDROcodone-homatropine (HYDROMET) 5-1.5 MG/5ML syrup Take 1 to 2 tsp every 4-6 hours as needed  240 mL  0  . DISCONTD: predniSONE (DELTASONE) 10 MG tablet 4 tabs for 2 days, then 3 tabs for 2 days, 2 tabs for 2 days, then 1 tab for 2 days, then stop  20 tablet  0  . DISCONTD: traMADol (ULTRAM) 50 MG tablet Take 1 tablet (50 mg total) by mouth every 4 (four) hours as needed for pain.  30 tablet  0    Allergies  Allergen Reactions  . Iodine   . Sulfonamide Derivatives       REACTION: throat swelling and itching    Current Medications, Allergies, Past Medical History, Past Surgical History, Family History, and Social History were reviewed in [employer] record.   Review of Systems         The patient complains of malaise, dyspnea on exertion, gas/bloating, urinary frequency, incontinence, muscle cramps, and anxiety.  The patient denies fever, chills, sweats, anorexia, fatigue, weakness, weight loss, sleep disorder, blurring, diplopia, eye irritation, eye discharge, vision loss, eye pain, photophobia, earache, ear discharge, tinnitus, decreased hearing, nasal congestion, nosebleeds, sore throat, hoarseness, chest pain, palpitations, syncope, orthopnea, PND, peripheral edema, cough, dyspnea at rest, excessive sputum, hemoptysis, wheezing, pleurisy, nausea, vomiting, diarrhea, constipation, change in bowel habits, abdominal pain, melena, hematochezia, jaundice, indigestion/heartburn, dysphagia, odynophagia, dysuria, hematuria, urinary hesitancy, nocturia, back pain, joint pain, joint swelling, muscle weakness, stiffness, arthritis, sciatica, restless legs, leg pain at night, leg pain with exertion, rash, itching, dryness, suspicious lesions, paralysis, paresthesias, seizures, tremors, vertigo, transient blindness, frequent falls, frequent headaches, difficulty walking, depression, memory loss, confusion, cold intolerance, heat intolerance, polydipsia, polyphagia, polyuria, unusual weight change, abnormal bruising, bleeding, enlarged lymph nodes, urticaria, allergic rash, hay fever, and recurrent infections.    Objective:   Physical Exam     WD, WN, 61 y/o WF in NAD... GENERAL:  Alert & oriented; pleasant & cooperative... HEENT:  [place]/AT, EOM-wnl, PERRLA, [place]-benign, EACs-clear, TMs-wnl, NOSE-sl red, THROAT-clear & wnl. NECK:  Supple w/ sl decrROM; no JVD; normal carotid impulses w/o bruits; no thyromegaly or nodules palpated; no  lymphadenopathy. CHEST:  Clear to P & A; without wheezes/ rales/ or rhonchi. HEART:  Regular Rhythm; without murmurs/ rubs/ or gallops.  ABDOMEN:  Soft & nontender; normal bowel sounds; no organomegaly or masses detected. EXT: without deformities or arthritic changes; no varicose veins/ venous insuffic/ or edema. +trigger points bilat... NEURO:  CN's intact; motor testing normal; sensory testing normal; gait normal & balance OK. DERM:  No lesions noted; no rash etc...  RADIOLOGY DATA:  Reviewed in the EPIC EMR & discussed w/ the patient...  LABORATORY DATA:  Reviewed in the EPIC EMR & discussed w/ the patient...   Assessment & Plan:   CPX>>   AB>  Stable w/o recent infectious exac...  HYPERCHOL>  Stable on diet & Crestor 5mg /d...  HYPOTHYROID>  Clinically stable & euthyroid on Synthroid 53mcg/d; TSH is now WNL, continue same...  GI>  She had screening colon from DrBuccini 8/12 & we will call for the report for our records...  Interstitial Cystitis>  Symptoms have been minimized & she is doing better overall...  FM>  She uses Flexeril prn...  Anxiety>  Stable on an occas Alpraz...   Patient's Medications  New Prescriptions   BENZONATATE (TESSALON) 100 MG CAPSULE    Take 1 capsule (100 mg total) by mouth every 6 (six) hours as needed for cough.  Previous Medications   ASPIRIN 81 MG TABLET    Take 81 mg by mouth daily.     CALCIUM CARBONATE (OS-CAL) 600 MG TABS    Take 3 tablets by mouth daily.     CHOLECALCIFEROL (VITAMIN D) 1000 UNITS CAPSULE    Take 1,000 Units by mouth daily.     COENZYME Q10 (COQ10) 100 MG CAPS    Take 1 capsule by mouth daily.     ESTRADIOL (ESTRACE) 1 MG TABLET    Take 0.5 mg by mouth every other day.     MULTIPLE VITAMINS-MINERALS (WOMENS MULTIVITAMIN PLUS PO)    Take 1 tablet by mouth daily.     VITAMIN C (ASCORBIC ACID) 500 MG TABLET    Take 500 mg by mouth daily.     VITAMIN E 400 UNIT CAPSULE    Take 400 Units by mouth daily.    Modified  Medications   Modified Medication Previous Medication   ALPRAZOLAM (XANAX) 0.25 MG TABLET ALPRAZolam (XANAX) 0.25 MG tablet      1/2 - 1 once daily as needed anxiety    1/2 - 1 once daily as needed anxiety   CYCLOBENZAPRINE (FLEXERIL) 10 MG TABLET cyclobenzaprine (FLEXERIL) 10 MG tablet      Take 1 tablet (10 mg total) by mouth daily.    Take 10 mg by mouth daily.     DIPHENHYD-HYDROCORT-NYSTATIN (FIRST-DUKES MOUTHWASH) SUSP Diphenhyd-Hydrocort-Nystatin (FIRST-DUKES MOUTHWASH) SUSP      Use as directed 5 mLs in the mouth or throat 3 (three) times daily as needed.    Use as directed 5 mLs in the mouth or throat 3 (three) times daily as needed.   LEVOTHYROXINE (SYNTHROID, LEVOTHROID) 75 MCG TABLET levothyroxine (SYNTHROID, LEVOTHROID) 75 MCG tablet      Take 1 tablet (75 mcg total) by mouth daily.    Take 1 tablet (75 mcg total) by mouth daily.   ROSUVASTATIN (CRESTOR) 10 MG TABLET rosuvastatin (CRESTOR) 10 MG tablet      Take as directed    Take 5 mg by mouth daily. Take as directed   Discontinued Medications   HYDROCODONE-HOMATROPINE (HYDROMET) 5-1.5 MG/5ML SYRUP    Take 1 to 2 tsp every 4-6 hours as needed   PREDNISONE (DELTASONE) 10 MG TABLET    4 tabs for  2 days, then 3 tabs for 2 days, 2 tabs for 2 days, then 1 tab for 2 days, then stop   TRAMADOL (ULTRAM) 50 MG TABLET    Take 1 tablet (50 mg total) by mouth every 4 (four) hours as needed for pain.

## 2011-12-09 NOTE — Patient Instructions (Addendum)
Today we updated your med list in our EPIC system...    Continue your current medications the same...    We refilled your meds per request...  We reviewed your recent blood work & gave you a copy of the results...  Call for any problems or if we can be of service in any way...  Let's plan a follow up visit in 1 yr.Marland KitchenMarland Kitchen

## 2011-12-10 ENCOUNTER — Other Ambulatory Visit: Payer: Self-pay | Admitting: Obstetrics and Gynecology

## 2011-12-10 DIAGNOSIS — Z1231 Encounter for screening mammogram for malignant neoplasm of breast: Secondary | ICD-10-CM

## 2011-12-13 ENCOUNTER — Telehealth: Payer: Self-pay | Admitting: Pulmonary Disease

## 2011-12-13 MED ORDER — CYCLOBENZAPRINE HCL 10 MG PO TABS: 10.0000 mg | ORAL_TABLET | Freq: Every day | ORAL | Status: DC

## 2011-12-13 MED ORDER — ROSUVASTATIN CALCIUM 10 MG PO TABS: ORAL_TABLET | ORAL | Status: DC

## 2011-12-13 MED ORDER — LEVOTHYROXINE SODIUM 75 MCG PO TABS: 75.0000 ug | ORAL_TABLET | Freq: Every day | ORAL | Status: DC

## 2011-12-13 NOTE — Telephone Encounter (Signed)
Spoke with pt. She states that [facility] still has not received the rxs that we faxed for her. I have sent them electronically and nothing further needed per pt.

## 2011-12-15 ENCOUNTER — Telehealth: Payer: Self-pay | Admitting: Pulmonary Disease

## 2011-12-15 NOTE — Telephone Encounter (Signed)
AT[facility] but was advised they were currently closed and had to lmtcb x1 for [facility]

## 2011-12-15 NOTE — Telephone Encounter (Signed)
I spoke with pharmacy and they state that the pt requested that they change her to the generic for synthroid. She states they just have to get the doctors approval as well because [place] law states that if the generic is through a different manufacturer that have to get pt and doctors approval. Per SN ok to give pt generic. Carron Curie, CMA

## 2011-12-17 ENCOUNTER — Telehealth: Payer: Self-pay | Admitting: Pulmonary Disease

## 2011-12-17 MED ORDER — ROSUVASTATIN CALCIUM 10 MG PO TABS: ORAL_TABLET | ORAL | Status: DC

## 2011-12-17 NOTE — Telephone Encounter (Signed)
I spoke with pt and she states [facility] advised her she could get the crestor cheaper through her local pharmacy instead of through them. i have sent new rx [facility]. Nothing further was needed

## 2012-01-04 ENCOUNTER — Telehealth: Payer: Self-pay | Admitting: Pulmonary Disease

## 2012-01-04 MED ORDER — MOXIFLOXACIN HCL 400 MG PO TABS: 400.0000 mg | ORAL_TABLET | Freq: Every day | ORAL | Status: AC

## 2012-01-04 NOTE — Telephone Encounter (Signed)
Per SN: may call in avelox 400mg  #7, 1 po qd.  Also recommend taking otc align once daily.  Thanks.

## 2012-01-04 NOTE — Telephone Encounter (Signed)
Called, spoke with pt who states she thinks she has bronchitis again.  C/o cough, spitting up green mucus, chest tightness in front and back x 3 days.  Chills and sweats on Sunday.  Denies wheezing or SOB.  Taking tessalon pearles for cough with relief and mucinex dm q12h.  Has hydromet if needed but has not taken any yet.  Requesting recs -- Dr. Kriste Basque, pls advise.  Thank you.  [facility]  Allergies verified:  Allergies  Allergen Reactions  . Iodine   . Sulfonamide Derivatives     REACTION: throat swelling and itching

## 2012-01-04 NOTE — Telephone Encounter (Signed)
Pt aware of SN recs. rx has been called into the pharmacy for pt.

## 2012-02-11 ENCOUNTER — Ambulatory Visit
Admission: RE | Admit: 2012-02-11 | Discharge: 2012-02-11 | Disposition: A | Discharge status: Home / Self Care | Payer: BC Managed Care – PPO | Source: Ambulatory Visit | Attending: Obstetrics and Gynecology | Admitting: Obstetrics and Gynecology

## 2012-02-11 DIAGNOSIS — Z1231 Encounter for screening mammogram for malignant neoplasm of breast: Secondary | ICD-10-CM

## 2012-03-15 ENCOUNTER — Encounter: Payer: Self-pay | Admitting: Adult Health

## 2012-03-15 ENCOUNTER — Ambulatory Visit (INDEPENDENT_AMBULATORY_CARE_PROVIDER_SITE_OTHER): Payer: BC Managed Care – PPO | Admitting: Adult Health

## 2012-03-15 ENCOUNTER — Ambulatory Visit (INDEPENDENT_AMBULATORY_CARE_PROVIDER_SITE_OTHER)
Admission: RE | Admit: 2012-03-15 | Discharge: 2012-03-15 | Disposition: A | Discharge status: Home / Self Care | Payer: BC Managed Care – PPO | Source: Ambulatory Visit | Attending: Adult Health | Admitting: Adult Health

## 2012-03-15 VITALS — BP 120/64 | HR 83 | Temp 97.0°F | Ht 63.0 in | Wt 123.4 lb

## 2012-03-15 DIAGNOSIS — R091 Pleurisy: Secondary | ICD-10-CM

## 2012-03-15 NOTE — Progress Notes (Signed)
Subjective:    Patient ID: Jaclyn Bennett, female    DOB: 12/02/1950, 61 y.o.   MRN: [id number]  HPI 61 y/o WF here   ~  Jun10:  last seen by me 3/09 w/ refractory AB & responded to Medrol, Avelox, MMW, fluids, etc... she has seen TP on severe occas w/ sore throats, bronchitic infections, etc... overall doing reasonably well and here for a CPX today... she denies CP/ palpit/ SOB/ dizzy/ syncope/ edema/ etc...  ~  July 28, 2009:  she was seen by TP on several occas for upper resp tract infections and AB requiring Pred, Avelox, Mucinex, etc...  presents today w/ similar URI symptoms after exposure to grand daughter w/ the flu... pt c/o cough, congestion, feeling poorly, temp to 99.6 - all over the past 3-4 d... she states symptoms persist despite Mucinex & fluids, & that ZPak doesn't work for her- wants Avelox...  ~  November 27, 2009:  "I'm doing good" she says and credits the improvement a an immune-enhancer from the health food store- ASTRALAGUS- "it's an herb that helps the lungs & immune system" & notes no further AB exac etc... her Chol is OK on the Crestor & CoQ10;  Thyroid controlled on the Synthroid50;  bladder symptoms stable;  & FM/ Anxiety controlled w/ Flex & Alpraz...  ~  December 03, 2010:  Yearly ROV & she remains stable w/o new complaints or concerns;  Breathing well w/o intercurrent URI or AB exac;  Denies CP, palpit, SOB, edema;  FLP looks good on Cres5;  Thyroid appears sl under-replaced w/ Levo50 7 we will incr to 41mcg/d;  NOTE: she has made an appt w/ DrBuccini for a colonoscopy!;  Her IC & FM have been quiescent;  She uses Flexeril for musc spasm & a rare Alpraz for nerves/ rest...    ~  December 09, 2011:  Yearly ROV & CPX> [place] had Shingles in right S2-3 dermatome 4/13 & treated by TP w/ Valtrex & Pred taper; rash resolved & min resid paresthesias but no neuralgia; she has already checked w/ her Insurance Co & they DON'T cover the vaccine so she wants to wait & think about it some  more; she is more concerned over abn sensation/ paresthesia on right side of face, comes & goes, ?ppt factors, etc; no apparent weakness, droop, etc==> I told her this was not from Shingles & she would need Neurology eval but she doesn't want referral yet; under stress raising grandchild...  Breathing has been good- denies cough, sputum, SOB, CP, edema, etc;  She had labs done proir to the Physical & FLP looks good on Cres5, Thyroid good on Synth75, & resting well w/ Flex10mg Qhs... She tells me she finally had Colonoscopy 8/12 by DrBuccini> we do not have his notes or report, she says it was neg & rec f/u 45yrs...    We reviewed prob list, meds, xrays and labs> see below>> LABS 6/13:  FLP- all parameters at goals on Cres5;  Chems- all wnl;  CBC- all wnl;  TSH=2.06 on Synth75;  VitD=54 on 1000u/d...  03/15/2012 Acute OV  Complains of mid to right sided back pain with inspiration x 3 days  Has had mild infrequent dry cough, minimal congestion. No fever or hemoptysis  Pain has radiated into right anterior chest wall, mainly when she coughs or breathes in deep Mild tenderness and pain on movement.  No known injury . No n/v/d. No rash.  Taking tylenol and advil without much help.  Previous  BMD in 2011 nml .   Problem List:    ASTHMATIC BRONCHITIS, ACUTE (ICD-466.0) - she gets 1-2 bouts of bronchitis yearly & they are "hard to shake"- requiring Medrol, Antibiotics, Mucinex, etc... prev doing well - w/o cough, sputum, hemoptysis, worsening dyspnea, wheezing, chest pains, snoring, daytime hypersomnolence, etc...  ~  CXR 11/10 showed normal heart size, clear lungs, sl scoliosis, NAD... ~  6/11:  she credits improvement on an herbal supplement= Astralagus.  Hx of ABNORMAL ELECTROCARDIOGRAM (ICD-794.31) - seen by [name] in 2001... she is asymptomatic and denies CP, palipit, dizziness, syncope, dyspnea, edema, etc...  ~  baseline EKG w/ poor R progression V1-3, diffuse SSTTWA...  ~  2DEcho 8/01 w/ mild ant  wall HK, norm EF, valves normal... ~  Cardiolite 8/01 showed norm perfusion, no ischemia, EF=61%... ~  f/u EKG 6/10 remains abnormal> no change; and she remains asymptomatic.  HYPERCHOLESTEROLEMIA (ICD-272.0) - treated w/ diet + CRESTOR 10mg - 1/2 daily + CoQ10 100mg /d... ~  TChol as high as 228, LDL up to 160 in 2007 ~  FLP 5/08 showed TChol 206, TG 62, HDL 50, LDL 132 ~  FLP 6/10 showed TChol 225, TG 99, HDL 56, LDL 157... rec> statin Rx- she initially declined but agreed to try Crestor5mg  w/ CoQ10... ~  FLP 6/11 on Cres5 showed TChol 150, TG 70, HDL 58, LDL 78 ~  FLP 6/12 on Cres5 showed TChol 165, TG 98, HDL 55, LDL 91 ~  FLP 6/13 on Cres5 showed TChol 139, TG 120, HDL 54, LDL 61  HYPOTHYROIDISM (ICD-244.9) - on SYNTHROID 56mcg/d... clinically euthyroid & taking meds daily. ~  TSH as high as 6.35 in 2007, started on Rx...  ~  TSH 5/08 = 4.75 on Rx... ~  labs 6/10 showed TSH= 4.99 ~  labs 6/11 on Levothy50 showed TSH= 3.46 ~  Labs 6/12 on Levothy50 showed TSH= 5.72 & we decided to incr the Levothy to 73mcg/d...  GI>> she had repeatedly refused screening colonoscopy... discussed again & she is requested to sched this important screening procedure==> she went to [employer], DrBuccini. ~  She reports that she had Colonoscopy 8/12 by DrBuccini & that it was neg w/ plan for recheck in 10 yrs; we do not have records & will call for this report.  INTERSTITIAL CYSTITIS (ICD-595.1) - followed by [name] & she describes 2 problems- small bladder, & not emptying completely. ~  6/12:  She reports that voiding is improved & she is emptying more completely.. ~  6/13:  She is largely asymptomatic & doing well...  FIBROMYALGIA (ICD-729.1) - takes FLEXERIL 10mg Qhs to help her rest & doing satis...  ANXIETY (ICD-300.00) w/ hx claustraphobia & uses ALPRAZOLAM 0.25mg  Tid Prn...  SHINGLES >> she had bout of Shingles involving the right S2-3 Dermatome 4/13 & treated by TP w/ Valtrex &  Pred...  HEALTH MAINTENANCE:  prev seen by [facility].Marland Kitchen.  now she sees [name]- yearly f/u & weaning Estradiol... [facility]/ [facility] on sonar last yr & f/u due soon...  she had BMD done for baseline 11/11 & TScores +0.4 in Spine, and -0.9 in left [facility] (mother had osteoporosis & she "doesn't want to get that")...  ~  Immunizations:  She is encourged to get the yearly flu vaccines;  Given TDAP 6/12... We discussed the Shingles vaccine but her insurance won't pay she says...   Past Surgical History  Procedure Date  . Total abdominal hysterectomy 1997    Outpatient  Encounter Prescriptions as of 03/15/2012  Medication Sig Dispense Refill  . ALPRAZolam (XANAX) 0.25 MG tablet 1/2 - 1 once daily as needed anxiety  30 tablet  5  . aspirin 81 MG tablet Take 81 mg by mouth daily.        . calcium carbonate (OS-CAL) 600 MG TABS Take 3 tablets by mouth daily.        . Cholecalciferol (VITAMIN D) 1000 UNITS capsule Take 1,000 Units by mouth daily.        . Coenzyme Q10 (COQ10) 100 MG CAPS Take 1 capsule by mouth daily.        . cyclobenzaprine (FLEXERIL) 10 MG tablet Take 1 tablet (10 mg total) by mouth daily.  90 tablet  3  . estradiol (ESTRACE) 1 MG tablet Take 0.5 mg by mouth every other day.        . levothyroxine (SYNTHROID, LEVOTHROID) 75 MCG tablet Take 1 tablet (75 mcg total) by mouth daily.  90 tablet  3  . Multiple Vitamins-Minerals (WOMENS MULTIVITAMIN PLUS PO) Take 1 tablet by mouth daily.        . rosuvastatin (CRESTOR) 10 MG tablet Take as directed  30 tablet  11  . vitamin C (ASCORBIC ACID) 500 MG tablet Take 500 mg by mouth daily.        . vitamin E 400 UNIT capsule Take 400 Units by mouth daily.        Marland Kitchen DISCONTD: Diphenhyd-Hydrocort-Nystatin (FIRST-DUKES MOUTHWASH) SUSP Use as directed 5 mLs in the mouth or throat 3 (three) times daily as needed.  120 mL  6    Allergies  Allergen Reactions  . Iodine   . Sulfonamide Derivatives      REACTION: throat swelling and itching    Current Medications, Allergies, Past Medical History, Past Surgical History, Family History, and Social History were reviewed in [employer] record.   Review of Systems Constitutional:   No  weight loss, night sweats,  Fevers, chills, fatigue, or  lassitude.  HEENT:   No headaches,  Difficulty swallowing,  Tooth/dental problems, or  Sore throat,                No sneezing, itching, ear ache, nasal congestion, post nasal drip,   CV:  No chest pain,  Orthopnea, PND, swelling in lower extremities, anasarca, dizziness, palpitations, syncope.   GI  No heartburn, indigestion, abdominal pain, nausea, vomiting, diarrhea, change in bowel habits, loss of appetite, bloody stools.   Resp: No shortness of breath with exertion or at rest.  No excess mucus, no productive cough,     No coughing up of blood.  No change in color of mucus.  No wheezing.  No chest wall deformity  Skin: no rash or lesions.  GU: no dysuria, change in color of urine, no urgency or frequency.  No flank pain, no hematuria   MS:  No joint pain or swelling.  No decreased range of motion.     Psych:  No change in mood or affect. No depression or anxiety.  No memory loss.               Objective:   Physical Exam     WD, WN, 61 y/o WF in NAD... GENERAL:  Alert & oriented; pleasant & cooperative... HEENT:  [place]/AT, EOM-wnl,   EACs-clear, TMs-wnl, NOSE-sl red, THROAT-clear & wnl. NECK:  Supple w/ sl decrROM; no JVD; normal carotid impulses w/o bruits; no thyromegaly or nodules palpated; no lymphadenopathy.  CHEST:  Clear to P & A; without wheezes/ rales/ or rhonchi. Tender along mid posterior thoracic, no deformity or rash noted.  HEART:  Regular Rhythm; without murmurs/ rubs/ or gallops. ABDOMEN:  Soft & nontender; normal bowel sounds; no organomegaly or masses detected. EXT: without deformities or arthritic changes; no varicose veins/ venous insuffic/ or  edema. +trigger points bilat...along back and shoulders  NEURO:    motor testing normal; sensory testing normal; gait normal & balance OK. DERM:  No lesions noted; no rash etc...     Assessment & Plan:

## 2012-03-15 NOTE — Patient Instructions (Addendum)
Advil 200mg  3 tabs Twice daily  For 5 days, take with food.  Warm heat to back/ribs As needed   Please contact office for sooner follow up if symptoms do not improve or worsen or seek emergency care  follow up Dr. Kriste Basque  As planned and As needed

## 2012-03-15 NOTE — Assessment & Plan Note (Signed)
Right sided Pleurisy  Check cxr today   Plan Advil 200mg  3 tabs Twice daily  For 5 days, take with food.  Warm heat to back/ribs As needed   Please contact office for sooner follow up if symptoms do not improve or worsen or seek emergency care  follow up Dr. Kriste Basque  As planned and As needed

## 2012-03-16 NOTE — Progress Notes (Signed)
Quick Note:  Called spoke with patient, advised of cxr results / recs as stated by TP. Pt verbalized her understanding and denied any questions. ______ 

## 2012-03-29 ENCOUNTER — Telehealth: Payer: Self-pay | Admitting: Adult Health

## 2012-03-29 MED ORDER — CEFDINIR 300 MG PO CAPS: 300.0000 mg | ORAL_CAPSULE | Freq: Two times a day (BID) | ORAL | Status: DC

## 2012-03-29 NOTE — Telephone Encounter (Signed)
Pt informed of Tammy Parrett's recommendations and that rx for Omnicef was sent to [facility].

## 2012-03-29 NOTE — Telephone Encounter (Signed)
Pt seen TP on 03-15-12 and dx with pleurisy.  Pt now c/o prod cough (green), low grade temp, eyes matting some, and low grade temp.  PT is taking Tessalon, Tylenol, MMW and Hydromet but is requesting abx.  Please advise.   [facility] Allergies  Allergen Reactions  . Iodine   . Sulfonamide Derivatives     REACTION: throat swelling and itching

## 2012-03-29 NOTE — Telephone Encounter (Signed)
Omnicef 300mg  Twice daily  #14 no refills Cont on current regimen  Please contact office for sooner follow up if symptoms do not improve or worsen or seek emergency care

## 2012-04-13 ENCOUNTER — Telehealth: Payer: Self-pay | Admitting: Pulmonary Disease

## 2012-04-13 MED ORDER — METHYLPREDNISOLONE 4 MG PO KIT: PACK | ORAL | Status: DC

## 2012-04-13 MED ORDER — MOXIFLOXACIN HCL 400 MG PO TABS: 400.0000 mg | ORAL_TABLET | Freq: Every day | ORAL | Status: DC

## 2012-04-13 NOTE — Telephone Encounter (Signed)
Spoke with patient says xfew days she has been spitting up green mucus and this morning the mucus has become thick and "clumped" in her throat. Patient also reports left eye is reddening and getting "crusty", feels like she has had low grade fever that she has been treating with Tylenol. Patient says she has been using tessalon perles and max strength Mucinex with no relief. Patient was given antibiotic on 03/29/12 by TP [place]) which symptoms still have not cleared. Patient requesting something to be called in, Dr. Kriste Basque please advise, Thank you!  [facility]  Allergies  Allergen Reactions  . Iodine   . Sulfonamide Derivatives     REACTION: throat swelling and itching

## 2012-04-13 NOTE — Telephone Encounter (Signed)
Per leigh medrol dose pak should be 4 mg instead of 5 mg. I spoke with pt and made her aware of SN results. She voiced her understanding of directions and rx has been called into the pharmacy. Nothing further was needed

## 2012-04-13 NOTE — Telephone Encounter (Signed)
lmomtcb x1 for pt 

## 2012-04-13 NOTE — Telephone Encounter (Signed)
Per SN---please call in avelox 400 mg  #7  1 daily, medrol doseapak  5 mg  #1  Take as directed, mucinex 1200 mg  Bid with plenty of fluids.  thanks

## 2012-09-25 ENCOUNTER — Telehealth: Payer: Self-pay | Admitting: Pulmonary Disease

## 2012-09-25 MED ORDER — AMOXICILLIN-POT CLAVULANATE 875-125 MG PO TABS: 1.0000 | ORAL_TABLET | Freq: Two times a day (BID) | ORAL | Status: DC

## 2012-09-25 MED ORDER — ROSUVASTATIN CALCIUM 10 MG PO TABS: ORAL_TABLET | ORAL | Status: DC

## 2012-09-25 NOTE — Telephone Encounter (Signed)
Per SN---   rec mucinex 2 po bid Increase fluids Nasal saline rx for augmentin 875 mg  #14 1 po bid

## 2012-09-25 NOTE — Telephone Encounter (Signed)
Per SN:  Yes.  -----  Called, spoke with pt.  Crestor rx sent to [facility] -- pt aware.  We have scheduled her for a yearly f/u with SN on December 11, 2012 at 12 pm.  She is aware of appt date and time.  Nothing further needed at this time.

## 2012-09-25 NOTE — Telephone Encounter (Signed)
Last OV with SN wasa 11/2011 No pending OV.  Would you be okay with giving her a 90 day rx for Crestor? Please advise.

## 2012-09-25 NOTE — Telephone Encounter (Signed)
I spoke with pt. She c/o cough w/ yellow-green phlem. Chest tx, possible low grade fever x couple days. Per pt she can see the mucus in the back of her throat. She has tessalon pearls if needed and has been taking mucinex cold and sinus. She is requesting ABX. Please advise SN thanks Last OV 03/15/12 No pending appt. Allergies  Allergen Reactions  . Iodine   . Sulfonamide Derivatives     REACTION: throat swelling and itching

## 2012-09-25 NOTE — Telephone Encounter (Signed)
I spoke with pt and is aware of SN recs. She voiced her understanding. rx has been sent.  

## 2012-09-26 ENCOUNTER — Telehealth: Payer: Self-pay | Admitting: Pulmonary Disease

## 2012-09-26 NOTE — Telephone Encounter (Signed)
Called and spoke with pt and she stated that her insurance will not cover the crestor 10 mg ----the will cover:  Atorvastatin 20 mg daily  #90 for $21 Simvastatin 40 mg 1 daily  #90  $15  Pt would like to change to one of these meds if SN feels this would be ok for her.  Pt stated that the crestor 10 mg  Tablets she has been taking 1/3 of these tablets daily.  SN please advise. Thanks  Allergies  Allergen Reactions  . Iodine   . Sulfonamide Derivatives     REACTION: throat swelling and itching

## 2012-09-26 NOTE — Telephone Encounter (Signed)
Calling again in ref to previous msg cn be reached at [phone] or [phone].Jaclyn Bennett

## 2012-09-27 MED ORDER — ATORVASTATIN CALCIUM 20 MG PO TABS: 20.0000 mg | ORAL_TABLET | Freq: Every day | ORAL | Status: DC

## 2012-09-27 MED ORDER — ROSUVASTATIN CALCIUM 10 MG PO TABS: 10.0000 mg | ORAL_TABLET | Freq: Every day | ORAL | Status: AC

## 2012-09-27 NOTE — Telephone Encounter (Signed)
Called spoke with patient Advised of SN's recs to change her crestor 10mg  to atrovastatin 20mg  Pt okay with this change and verbalized her understanding Pt does not have enough crestor to last until her new medication arrives 1 sample crestor 10mg  left up front for pt to pick up at her convenience Documented per protocol  Called [facility] at [phone] Spoke with pharmacist Tinnie Gens and gave verbal order to cancel the crestor 10mg  sent on 4.14.14 New rx given for Atorvastatin 20mg  qd #90 with 3 refills Med list updated  Nothing further needed Will sign off

## 2012-09-27 NOTE — Telephone Encounter (Signed)
Per SN---  Ok to change to atrovastatin 20 mg  1 daily.   thanks

## 2012-09-28 ENCOUNTER — Telehealth: Payer: Self-pay | Admitting: Pulmonary Disease

## 2012-09-28 MED ORDER — FAMCICLOVIR 500 MG PO TABS: 500.0000 mg | ORAL_TABLET | Freq: Three times a day (TID) | ORAL | Status: DC

## 2012-09-28 MED ORDER — METHYLPREDNISOLONE 4 MG PO KIT: PACK | ORAL | Status: DC

## 2012-09-28 NOTE — Telephone Encounter (Signed)
Per SN--  Call in famvir 500 mg  #21  1 po tid until gone no refills Medrol dosepak #1  Take as directed with no refills  Reminder to get the shingles vaccine since this is her 2nd time having the shingles.    lmomtcb for the pt.

## 2012-09-28 NOTE — Telephone Encounter (Signed)
Called and spoke with pt and she stated that she was given augmentin on Monday and did start this for the congestion.  She stated that last night she noticed this rash and she knows that this is shingles again.   This is in the same place as before.  She stated that the valtrex she took last year worked but made her very sick.  She wanted to know if there is something else that SN can call in for her.    Allergies  Allergen Reactions  . Iodine   . Sulfonamide Derivatives     REACTION: throat swelling and itching

## 2012-09-28 NOTE — Telephone Encounter (Signed)
Please call back on cell--[phone]

## 2012-09-28 NOTE — Telephone Encounter (Signed)
I spoke with pt and advised her we will call once SN advise [place] of recs. She voiced her understanding.

## 2012-10-02 NOTE — Telephone Encounter (Signed)
LMTCB

## 2012-10-02 NOTE — Telephone Encounter (Signed)
Pt states she picked up prescriptions on Friday. Nothing further needed.  Pt advised needs to get shingles vaccine. Carron Curie, CMA

## 2012-10-02 NOTE — Telephone Encounter (Signed)
ATC patient, no answer LMOMTCB 

## 2012-10-02 NOTE — Telephone Encounter (Signed)
Patient returned triage's call.  Jaclyn Bennett

## 2012-11-21 ENCOUNTER — Telehealth: Payer: Self-pay | Admitting: Pulmonary Disease

## 2012-11-21 DIAGNOSIS — E7800 Pure hypercholesterolemia, unspecified: Secondary | ICD-10-CM

## 2012-11-21 DIAGNOSIS — Z0000 Encounter for general adult medical examination without abnormal findings: Secondary | ICD-10-CM

## 2012-11-21 DIAGNOSIS — E039 Hypothyroidism, unspecified: Secondary | ICD-10-CM

## 2012-11-21 DIAGNOSIS — F411 Generalized anxiety disorder: Secondary | ICD-10-CM

## 2012-11-21 NOTE — Telephone Encounter (Signed)
Spoke with pt wants to have fasting labs before her appt  on June 30th.  Dr Kriste Basque please advise  Thank you

## 2012-11-23 NOTE — Telephone Encounter (Signed)
Jaclyn Bennett entered all of her labs in the computer and I have left detailed msg for her to be aware and advised to come fasting for labs

## 2012-11-23 NOTE — Telephone Encounter (Signed)
Last labs done 6.25.13 Orders placed for: CBCD, BMET, Hepatic, Lipid, Vit D, UA, TSH Pt aware and to be fasting Labs dated for the week of 6.23.14

## 2012-12-04 ENCOUNTER — Other Ambulatory Visit (INDEPENDENT_AMBULATORY_CARE_PROVIDER_SITE_OTHER): Payer: BC Managed Care – PPO

## 2012-12-04 DIAGNOSIS — Z0000 Encounter for general adult medical examination without abnormal findings: Secondary | ICD-10-CM

## 2012-12-04 DIAGNOSIS — F411 Generalized anxiety disorder: Secondary | ICD-10-CM

## 2012-12-04 DIAGNOSIS — E7800 Pure hypercholesterolemia, unspecified: Secondary | ICD-10-CM

## 2012-12-04 DIAGNOSIS — E039 Hypothyroidism, unspecified: Secondary | ICD-10-CM

## 2012-12-04 LAB — BASIC METABOLIC PANEL
BUN: 13 mg/dL (ref 6–23)
CO2: 32 mEq/L (ref 19–32)
Calcium: 9.1 mg/dL (ref 8.4–10.5)
Chloride: 105 mEq/L (ref 96–112)
Creatinine, Ser: 0.8 mg/dL (ref 0.4–1.2)
Glucose, Bld: 101 mg/dL — ABNORMAL HIGH (ref 70–99)

## 2012-12-04 LAB — CBC WITH DIFFERENTIAL/PLATELET
Basophils Absolute: 0 10*3/uL (ref 0.0–0.1)
Eosinophils Relative: 4.2 % (ref 0.0–5.0)
HCT: 39 % (ref 36.0–46.0)
Hemoglobin: 13.1 g/dL (ref 12.0–15.0)
Lymphocytes Relative: 34.9 % (ref 12.0–46.0)
Lymphs Abs: 1.8 10*3/uL (ref 0.7–4.0)
Monocytes Relative: 10 % (ref 3.0–12.0)
Neutro Abs: 2.6 10*3/uL (ref 1.4–7.7)
RDW: 12.7 % (ref 11.5–14.6)
WBC: 5.2 10*3/uL (ref 4.5–10.5)

## 2012-12-04 LAB — TSH: TSH: 1.53 u[IU]/mL (ref 0.35–5.50)

## 2012-12-04 LAB — HEPATIC FUNCTION PANEL
ALT: 41 U/L — ABNORMAL HIGH (ref 0–35)
AST: 34 U/L (ref 0–37)
Alkaline Phosphatase: 97 U/L (ref 39–117)
Total Bilirubin: 0.6 mg/dL (ref 0.3–1.2)

## 2012-12-04 LAB — URINALYSIS, ROUTINE W REFLEX MICROSCOPIC
Bilirubin Urine: NEGATIVE
Ketones, ur: NEGATIVE
Specific Gravity, Urine: 1.01 (ref 1.000–1.030)
Urine Glucose: NEGATIVE
pH: 7 (ref 5.0–8.0)

## 2012-12-04 LAB — LIPID PANEL
LDL Cholesterol: 79 mg/dL (ref 0–99)
Total CHOL/HDL Ratio: 3

## 2012-12-05 LAB — VITAMIN D 25 HYDROXY (VIT D DEFICIENCY, FRACTURES): Vit D, 25-Hydroxy: 50 ng/mL (ref 30–89)

## 2012-12-11 ENCOUNTER — Encounter: Payer: Self-pay | Admitting: Pulmonary Disease

## 2012-12-11 ENCOUNTER — Ambulatory Visit (INDEPENDENT_AMBULATORY_CARE_PROVIDER_SITE_OTHER): Payer: BLUE CROSS/BLUE SHIELD | Admitting: Pulmonary Disease

## 2012-12-11 VITALS — BP 118/72 | HR 78 | Temp 97.8°F | Ht 63.0 in | Wt 120.0 lb

## 2012-12-11 DIAGNOSIS — IMO0001 Reserved for inherently not codable concepts without codable children: Secondary | ICD-10-CM

## 2012-12-11 DIAGNOSIS — E039 Hypothyroidism, unspecified: Secondary | ICD-10-CM

## 2012-12-11 DIAGNOSIS — E7800 Pure hypercholesterolemia, unspecified: Secondary | ICD-10-CM

## 2012-12-11 DIAGNOSIS — Z0000 Encounter for general adult medical examination without abnormal findings: Secondary | ICD-10-CM

## 2012-12-11 DIAGNOSIS — N3010 Interstitial cystitis (chronic) without hematuria: Secondary | ICD-10-CM

## 2012-12-11 DIAGNOSIS — F411 Generalized anxiety disorder: Secondary | ICD-10-CM

## 2012-12-11 MED ORDER — LEVOTHYROXINE SODIUM 75 MCG PO TABS: 75.0000 ug | ORAL_TABLET | Freq: Every day | ORAL | Status: DC

## 2012-12-11 MED ORDER — ALPRAZOLAM 0.25 MG PO TABS: ORAL_TABLET | ORAL | Status: DC

## 2012-12-11 MED ORDER — CYCLOBENZAPRINE HCL 10 MG PO TABS: 10.0000 mg | ORAL_TABLET | Freq: Every day | ORAL | Status: DC

## 2012-12-11 MED ORDER — FIRST-DUKES MOUTHWASH MT SUSP: OROMUCOSAL | Status: DC

## 2012-12-11 NOTE — Patient Instructions (Addendum)
Today we updated your med list in our EPIC system...    Continue your current medications the same...    We refilled your meds per request...  We reviewed your recent labs & gave you a copy for your records...  Call for any questions...  Let's plan a follow up visit in 2yr, sooner if needed for problems.Marland KitchenMarland Kitchen

## 2012-12-11 NOTE — Progress Notes (Signed)
Subjective:    Patient ID: Jaclyn Bennett, female    DOB: [birth year], 62 y.o.   MRN: [id number]  HPI 61 y/o WF here for a follow up visit & annual CPX...  ~  November 27, 2009:  "I'm doing good" she says and credits the improvement a an immune-enhancer from the health food store- ASTRALAGUS- "it's an herb that helps the lungs & immune system" & notes no further AB exac etc... her Chol is OK on the Crestor & CoQ10;  Thyroid controlled on the Synthroid50;  bladder symptoms stable;  & FM/ Anxiety controlled w/ Flex & Alpraz...  ~  December 03, 2010:  Yearly ROV & she remains stable w/o new complaints or concerns;  Breathing well w/o intercurrent URI or AB exac;  Denies CP, palpit, SOB, edema;  FLP looks good on Cres5;  Thyroid appears sl under-replaced w/ Levo50 7 we will incr to 46mcg/d;  NOTE: she has made an appt w/ DrBuccini for a colonoscopy!;  Her IC & FM have been quiescent;  She uses Flexeril for musc spasm & a rare Alpraz for nerves/ rest...    ~  December 09, 2011:  Yearly ROV & CPX> Jaclyn Bennett had Shingles in right S2-3 dermatome 4/13 & treated by TP w/ Valtrex & Pred taper; rash resolved & min resid paresthesias but no neuralgia; she has already checked w/ her Insurance Co & they DON'T cover the vaccine so she wants to wait & think about it some more; she is more concerned over abn sensation/ paresthesia on right side of face, comes & goes, ?ppt factors, etc; no apparent weakness, droop, etc==> I told her this was not from Shingles & she would need Neurology eval but she doesn't want referral yet; under stress raising grandchild...  Breathing has been good- denies cough, sputum, SOB, CP, edema, etc;  She had labs done proir to the Physical & FLP looks good on Cres5, Thyroid good on Synth75, & resting well w/ Flex10mg Qhs... She tells me she finally had Colonoscopy 8/12 by DrBuccini> we do not have his notes or report, she says it was neg & rec f/u 58yrs...    We reviewed prob list, meds, xrays and labs> see  below>> LABS 6/13:  FLP- all parameters at goals on Cres5;  Chems- all wnl;  CBC- all wnl;  TSH=2.06 on Synth75;  VitD=54 on 1000u/d...   ~  December 11, 2012:  Yearly ROV & CPX> Jaclyn Bennett reports a good yr- notes min bronchitis this yr & no new complaints or concerns; she had "Shingles" 4/14 on her buttocks "same place as before" & meds were called in; I noted this may be a diff virus but resolved now & she does not want additional tests; we also discussed the Shingles vaccine but she wants to wait on that as well... We reviewed the following medical problems during today's office visit >>     AB> she notes 1-2 bouts of bronchitis yearly & they are often hard to shake & require Antibiotics, Medrol, Mucinex, etc; she had a good yr in 2013-4 w/o recurrent infections...    Hx Abn EKG> EKG w/ poor R prog & NSSTTWA; she is asymptomatic & Cardiolite 2001 was wnl w/ norm perfusion, no ischemia, EF=61%; she denies CP, palpit, SOB, edema...    CHOL> on Cres10-taking 1/2 daily but c/o the cost & wants a generic substitute; FLP on Cres5 6/14 showed TChol 165, TG 166, HDL 53, LDL 79; therefore try Atorva20- 1/2 daily.Marland KitchenMarland Kitchen  Hypothy> on Levothy75; Labs showed TSH= 1.53, continue same...    GI- she is followed by DrBuccini w/ reported neg colonoscopy in 2012 (we do not have record)...    GU- hx interstitial cystitis> prev eval by [name]; said to have a sm bladder & not emptying completely; symptoms have abated in the last few yrs...    ?FM> on Flex10 prn which helps her rest...    Anxiety> on Xanax0.25 prn for anxiety...    Hx Shingles> hx right S2-3 Shingles 4/13 treated w/ Valtrex & Pred; she called w/ recurrent shingles outbreak in similar area in 2014- meds called in for her & resolved; she declined further testing & has declined the Shingles vaccine... We reviewed prob list, meds, xrays and labs> see below for updates >>  LABS 6/14:  FLP- at goals on Cres5 x TG=166;  Chems- wnl;  CBC- wnl;  TSH=1.53,  VitD=50;   UA- clear...          Problem List:    ASTHMATIC BRONCHITIS, ACUTE (ICD-466.0) - she gets 1-2 bouts of bronchitis yearly & they are "hard to shake"- requiring Medrol, Antibiotics, Mucinex, etc... prev doing well - w/o cough, sputum, hemoptysis, worsening dyspnea, wheezing, chest pains, snoring, daytime hypersomnolence, etc...  ~  CXR 11/10 showed normal heart size, clear lungs, sl scoliosis, NAD... ~  6/11:  she credits improvement on an herbal supplement= Astralagus. ~  6/14: she notes 1-2 bouts of bronchitis yearly & they are often hard to shake & require Antibiotics, Medrol, Mucinex, etc; she had a good yr in 2013-4 w/o recurrent infections.  Hx of ABNORMAL ELECTROCARDIOGRAM (ICD-794.31) - seen by [name] in 2001... she is asymptomatic and denies CP, palipit, dizziness, syncope, dyspnea, edema, etc...  ~  baseline EKG w/ poor R progression V1-3, diffuse SSTTWA...  ~  2DEcho 8/01 w/ mild ant wall HK, norm EF, valves normal... ~  Cardiolite 8/01 showed norm perfusion, no ischemia, EF=61%... ~  f/u EKG 6/10 remains abnormal> no change; and she remains asymptomatic.  HYPERCHOLESTEROLEMIA (ICD-272.0) >>  ~  TChol as high as 228, LDL up to 160 in 2007 ~  FLP 5/08 showed TChol 206, TG 62, HDL 50, LDL 132... She does not want meds, reviewed diet + exercise... ~  FLP 6/10 showed TChol 225, TG 99, HDL 56, LDL 157... rec> statin Rx- she initially declined but agreed to try Crestor5mg  w/ CoQ10... ~  FLP 6/11 on Cres5 showed TChol 150, TG 70, HDL 58, LDL 78 ~  FLP 6/12 on Cres5 showed TChol 165, TG 98, HDL 55, LDL 91 ~  FLP 6/13 on Cres5 showed TChol 139, TG 120, HDL 54, LDL 61 ~  FLP 6/14 on Cres5 showed TChol 165, TG 166, HDL 53, LDL 79... Asking to change to generic alternative- try ATORVA20-1/2 Qd...  HYPOTHYROIDISM (ICD-244.9) - clinically euthyroid & taking meds daily. ~  TSH as high as 6.35 in 2007, started on Rx w/ Synthroid 77mcg/d...  ~  TSH 5/08 = 4.75 on Rx... ~  labs 6/10 showed TSH=  4.99 ~  labs 6/11 on Levothy50 showed TSH= 3.46 ~  Labs 6/12 on Levothy50 showed TSH= 5.72 & we decided to incr the Levothy to 64mcg/d... ~  Labs 6/13 on Levo75 showed TSH= 2.06 ~  Labs 6/14 on Levo75 showed TSH= 1.53  GI>> she had repeatedly refused screening colonoscopy... discussed again & she is requested to sched this important screening procedure==> she went to [employer], DrBuccini. ~  She reports that she had  Colonoscopy 8/12 by DrBuccini & that it was neg w/ plan for recheck in 10 yrs; we do not have records & will call for this report.  INTERSTITIAL CYSTITIS (ICD-595.1) - followed by [name] & she describes 2 problems- small bladder, & not emptying completely. ~  6/12:  She reports that voiding is improved & she is emptying more completely.. ~  6/13:  She is largely asymptomatic & doing well... ~  6/14:  She continues to deny symptoms, doing satis...  FIBROMYALGIA (ICD-729.1) - takes FLEXERIL 10mg Qhs to help her rest & doing satis...  ANXIETY (ICD-300.00) w/ hx claustraphobia & uses ALPRAZOLAM 0.25mg  Tid Prn...  SHINGLES >> she had bout of Shingles involving the right S2-3 Dermatome 4/13 & treated by TP w/ Valtrex & Pred... ~  2014: she called w/ recurrent shingles outbreak in similar area in 2014- meds called in for her & resolved; she declined further testing & has declined the Shingles vaccine.  HEALTH MAINTENANCE:  prev seen by [facility] for GYN.Marland Kitchen.  now she sees [name]- yearly f/u & weaning Estradiol... [facility]/ [facility] son[facility] last yr & f/u due soon...  she had BMD done for baseline 11/11 & TScores +0.4 in Spine, and -0.9 in left [facility] (mother had osteoporosis & she "doesn't want to get that")...  ~  Immunizations:  She is encourged to get the yearly flu vaccines;  Given TDAP 6/12... We discussed the Shingles vaccine but her insurance won't pay she says...   Past Surgical History  Procedure Laterality Date  . Total  abdominal hysterectomy  1997    Outpatient Encounter Prescriptions as of 12/11/2012  Medication Sig Dispense Refill  . ALPRAZolam (XANAX) 0.25 MG tablet 1/2 - 1 once daily as needed anxiety  30 tablet  5  . aspirin 81 MG tablet Take 81 mg by mouth daily.        Marland Kitchen atorvastatin (LIPITOR) 20 MG tablet Take 1 tablet (20 mg total) by mouth daily. Or as directed  90 tablet  3  . calcium carbonate (OS-CAL) 600 MG TABS Take 3 tablets by mouth daily.        . Cholecalciferol (VITAMIN D) 1000 UNITS capsule Take 1,000 Units by mouth daily.        . Coenzyme Q10 (COQ10) 100 MG CAPS Take 1 capsule by mouth daily.        . cyclobenzaprine (FLEXERIL) 10 MG tablet Take 1 tablet (10 mg total) by mouth daily.  90 tablet  3  . estradiol (ESTRACE) 1 MG tablet Take 0.5 mg by mouth every other day.        . levothyroxine (SYNTHROID, LEVOTHROID) 75 MCG tablet Take 1 tablet (75 mcg total) by mouth daily.  90 tablet  3  . Multiple Vitamins-Minerals (WOMENS MULTIVITAMIN PLUS PO) Take 1 tablet by mouth daily.        . vitamin C (ASCORBIC ACID) 500 MG tablet Take 500 mg by mouth daily.        . vitamin E 400 UNIT capsule Take 400 Units by mouth daily.        . [DISCONTINUED] amoxicillin-clavulanate (AUGMENTIN) 875-125 MG per tablet Take 1 tablet by mouth 2 (two) times daily.  14 tablet  0  . [DISCONTINUED] cefdinir (OMNICEF) 300 MG capsule Take 1 capsule (300 mg total) by mouth 2 (two) times daily.  14 capsule  0  . [DISCONTINUED] famciclovir (FAMVIR) 500 MG tablet Take 1 tablet (500 mg total) by mouth 3 (  three) times daily.  21 tablet  0  . [DISCONTINUED] methylPREDNISolone (MEDROL, PAK,) 4 MG tablet Take as directed  21 tablet  0  . [DISCONTINUED] moxifloxacin (AVELOX) 400 MG tablet Take 1 tablet (400 mg total) by mouth daily.  7 tablet  0   No facility-administered encounter medications on file as of 12/11/2012.    Allergies  Allergen Reactions  . Iodine   . Sulfonamide Derivatives     REACTION: throat swelling  and itching    Current Medications, Allergies, Past Medical History, Past Surgical History, Family History, and Social History were reviewed in [employer] record.   Review of Systems         The patient complains of malaise, dyspnea on exertion, gas/bloating, urinary frequency, incontinence, muscle cramps, and anxiety.  The patient denies fever, chills, sweats, anorexia, fatigue, weakness, weight loss, sleep disorder, blurring, diplopia, eye irritation, eye discharge, vision loss, eye pain, photophobia, earache, ear discharge, tinnitus, decreased hearing, nasal congestion, nosebleeds, sore throat, hoarseness, chest pain, palpitations, syncope, orthopnea, PND, peripheral edema, cough, dyspnea at rest, excessive sputum, hemoptysis, wheezing, pleurisy, nausea, vomiting, diarrhea, constipation, change in bowel habits, abdominal pain, melena, hematochezia, jaundice, indigestion/heartburn, dysphagia, odynophagia, dysuria, hematuria, urinary hesitancy, nocturia, back pain, joint pain, joint swelling, muscle weakness, stiffness, arthritis, sciatica, restless legs, leg pain at night, leg pain with exertion, rash, itching, dryness, suspicious lesions, paralysis, paresthesias, seizures, tremors, vertigo, transient blindness, frequent falls, frequent headaches, difficulty walking, depression, memory loss, confusion, cold intolerance, heat intolerance, polydipsia, polyphagia, polyuria, unusual weight change, abnormal bruising, bleeding, enlarged lymph nodes, urticaria, allergic rash, hay fever, and recurrent infections.    Objective:   Physical Exam     WD, WN, 62 y/o WF in NAD... GENERAL:  Alert & oriented; pleasant & cooperative... HEENT:  [place]/AT, EOM-wnl, PERRLA, Fundi-benign, EACs-clear, TMs-wnl, NOSE-sl red, THROAT-clear & wnl. NECK:  Supple w/ sl decrROM; no JVD; normal carotid impulses w/o bruits; no thyromegaly or nodules palpated; no lymphadenopathy. CHEST:  Clear to P & A;  without wheezes/ rales/ or rhonchi. HEART:  Regular Rhythm; without murmurs/ rubs/ or gallops. ABDOMEN:  Soft & nontender; normal bowel sounds; no organomegaly or masses detected. EXT: without deformities or arthritic changes; no varicose veins/ venous insuffic/ or edema. +trigger points bilat... NEURO:  CN's intact; motor testing normal; sensory testing normal; gait normal & balance OK. DERM:  No lesions noted; no rash etc...  RADIOLOGY DATA:  Reviewed in the EPIC EMR & discussed w/ the patient...  LABORATORY DATA:  Reviewed in the EPIC EMR & discussed w/ the patient...   Assessment & Plan:   CPX>>   AB>  Stable w/o recent infectious exac...  HYPERCHOL>  Stable on diet & Crestor 5mg /d => ch to ATORVA20- 1/2 tab daily...  HYPOTHYROID>  Clinically stable & euthyroid on Synthroid 33mcg/d; TSH is now WNL, continue same...  GI>  She had screening colon from DrBuccini 8/12 & we will call for the report for our records...  Interstitial Cystitis>  Symptoms have been minimized & she is doing better overall...  FM>  She uses Flexeril prn...  Anxiety>  Stable on an occas Alpraz...   Patient's Medications  New Prescriptions   DIPHENHYD-HYDROCORT-NYSTATIN (FIRST-DUKES MOUTHWASH) SUSP    1 tsp gargle and swallow four times daily as needed  Previous Medications   ASPIRIN 81 MG TABLET    Take 81 mg by mouth daily.     CALCIUM CARBONATE (OS-CAL) 600 MG TABS    Take 3 tablets by  mouth daily.     CHOLECALCIFEROL (VITAMIN D) 1000 UNITS CAPSULE    Take 1,000 Units by mouth daily.     COENZYME Q10 (COQ10) 100 MG CAPS    Take 1 capsule by mouth daily.     ESTRADIOL (ESTRACE) 1 MG TABLET    Take 0.5 mg by mouth every other day.     MULTIPLE VITAMINS-MINERALS (WOMENS MULTIVITAMIN PLUS PO)    Take 1 tablet by mouth daily.     VITAMIN C (ASCORBIC ACID) 500 MG TABLET    Take 500 mg by mouth daily.     VITAMIN E 400 UNIT CAPSULE    Take 400 Units by mouth daily.    Modified Medications   Modified  Medication Previous Medication   ALPRAZOLAM (XANAX) 0.25 MG TABLET ALPRAZolam (XANAX) 0.25 MG tablet      1/2 - 1 once daily as needed anxiety    1/2 - 1 once daily as needed anxiety   ATORVASTATIN (LIPITOR) 20 MG TABLET atorvastatin (LIPITOR) 20 MG tablet      Take 1/2 tablet by mouth daily    Take 1 tablet (20 mg total) by mouth daily. Or as directed   CYCLOBENZAPRINE (FLEXERIL) 10 MG TABLET cyclobenzaprine (FLEXERIL) 10 MG tablet      Take 1 tablet (10 mg total) by mouth daily.    Take 1 tablet (10 mg total) by mouth daily.   LEVOTHYROXINE (SYNTHROID, LEVOTHROID) 75 MCG TABLET levothyroxine (SYNTHROID, LEVOTHROID) 75 MCG tablet      Take 1 tablet (75 mcg total) by mouth daily.    Take 1 tablet (75 mcg total) by mouth daily.  Discontinued Medications   AMOXICILLIN-CLAVULANATE (AUGMENTIN) 875-125 MG PER TABLET    Take 1 tablet by mouth 2 (two) times daily.   CEFDINIR (OMNICEF) 300 MG CAPSULE    Take 1 capsule (300 mg total) by mouth 2 (two) times daily.   FAMCICLOVIR (FAMVIR) 500 MG TABLET    Take 1 tablet (500 mg total) by mouth 3 (three) times daily.   METHYLPREDNISOLONE (MEDROL, PAK,) 4 MG TABLET    Take as directed   MOXIFLOXACIN (AVELOX) 400 MG TABLET    Take 1 tablet (400 mg total) by mouth daily.

## 2013-01-22 ENCOUNTER — Other Ambulatory Visit: Payer: Self-pay

## 2013-01-22 ENCOUNTER — Other Ambulatory Visit: Payer: Self-pay | Admitting: Obstetrics and Gynecology

## 2013-01-22 DIAGNOSIS — Z1231 Encounter for screening mammogram for malignant neoplasm of breast: Secondary | ICD-10-CM

## 2013-01-22 DIAGNOSIS — Z780 Asymptomatic menopausal state: Secondary | ICD-10-CM

## 2013-02-15 ENCOUNTER — Ambulatory Visit
Admission: RE | Admit: 2013-02-15 | Discharge: 2013-02-15 | Disposition: A | Discharge status: Home / Self Care | Payer: BC Managed Care – PPO | Source: Ambulatory Visit | Attending: Obstetrics and Gynecology | Admitting: Obstetrics and Gynecology

## 2013-02-15 ENCOUNTER — Ambulatory Visit
Admission: RE | Admit: 2013-02-15 | Discharge: 2013-02-15 | Disposition: A | Discharge status: Home / Self Care | Payer: BC Managed Care – PPO | Source: Ambulatory Visit

## 2013-02-15 DIAGNOSIS — Z780 Asymptomatic menopausal state: Secondary | ICD-10-CM

## 2013-02-15 DIAGNOSIS — Z1231 Encounter for screening mammogram for malignant neoplasm of breast: Secondary | ICD-10-CM

## 2013-04-27 ENCOUNTER — Telehealth: Payer: Self-pay | Admitting: Adult Health

## 2013-04-27 MED ORDER — METHYLPREDNISOLONE 4 MG PO KIT: PACK | ORAL | Status: DC

## 2013-04-27 MED ORDER — FAMCICLOVIR 500 MG PO TABS: 500.0000 mg | ORAL_TABLET | Freq: Three times a day (TID) | ORAL | Status: DC

## 2013-04-27 NOTE — Telephone Encounter (Signed)
Sorry to hear that  That is fine , please let her know if not working will need ov for assessment.  Please contact office for sooner follow up if symptoms do not improve or worsen or seek emergency care  Valtrex 1000mg   1 po Three times a day  #21 , no refills  Medrol dose pack # 1 , TAD , no refills.  follow up Dr. Kriste Basque  As planned and As needed

## 2013-04-27 NOTE — Telephone Encounter (Signed)
Called, spoke with pt.  Informed her we would call in famvir 500 mg tid x 7 days along with medrol dosepak to [place].  She verbalized understanding of this.  She is to call back if symptoms do not improve or worsen.

## 2013-04-27 NOTE — Telephone Encounter (Signed)
Spoke to pt. States that she has shingles again. It's in the same location as the last time TP treated her for this, below her waist on the top of her bottom. Reports that she is aching all over due to pain located where the shingles are. Denies fever or chills. The last time she was treated we gave her Valtrex 1000mg  1 TID and a Medrol Dose Pak. Those medications helped her tremendously. Would like for these to be sent in again.  TP - please advise. Thanks.

## 2013-04-27 NOTE — Telephone Encounter (Signed)
Called, spoke with pt.  Informed her of below per TP.  She verbalized understanding of this. Pt states the last time she took Valtrex it made her "deathly sick" and "was like poison" to her.  Pt staets she had to take famciclovir, which helped. She is requesting famciclovir to be sent in - NOT valtrex along with the Medrol Dose Pak TP, pls advise.  Thank you.  ** I have added Valrex to pt's allergy list.

## 2013-04-27 NOTE — Telephone Encounter (Signed)
Famvir 500mg  Three times a day  For 7 days #21  No refills

## 2013-05-16 ENCOUNTER — Telehealth: Payer: Self-pay | Admitting: Pulmonary Disease

## 2013-05-16 MED ORDER — BENZONATATE 200 MG PO CAPS: 200.0000 mg | ORAL_CAPSULE | Freq: Three times a day (TID) | ORAL | Status: DC | PRN

## 2013-05-16 MED ORDER — AMOXICILLIN-POT CLAVULANATE 875-125 MG PO TABS: 1.0000 | ORAL_TABLET | Freq: Two times a day (BID) | ORAL | Status: DC

## 2013-05-16 NOTE — Telephone Encounter (Signed)
Pt is aware of SN recs. Rx has been sent in. 

## 2013-05-16 NOTE — Telephone Encounter (Signed)
Per SN---  augmentin 875 mg  #14  1 po bid mucinex 2 po bid Fluids Align once daily MMW  Ok to refill. Thanks

## 2013-05-16 NOTE — Telephone Encounter (Signed)
Spoke to pt. States that she thinks she has bronchitis. Has been coughing up green mucus for 1 week. Reports having slight fever, she has been using tylenol to help with this. Would like SN recs. Wants MMW to be refilled as well.  SN - please advise. Thanks.

## 2013-05-31 ENCOUNTER — Telehealth: Payer: Self-pay | Admitting: Pulmonary Disease

## 2013-05-31 MED ORDER — AMOXICILLIN-POT CLAVULANATE 875-125 MG PO TABS: 1.0000 | ORAL_TABLET | Freq: Two times a day (BID) | ORAL | Status: DC

## 2013-05-31 NOTE — Telephone Encounter (Signed)
Pt aware of recs. rx sent. Nothing further needed 

## 2013-05-31 NOTE — Telephone Encounter (Signed)
Per SN---  augmentin 875 mg  #14   1 po bid Align once daily fluids

## 2013-05-31 NOTE — Telephone Encounter (Signed)
I called and spoke with pt. She c/o prod cough w/ green phlem, green d/c from nose, chest tx, sore throat on tues, chest cong x 2 days. Pt is requesting recs. Please advise SN thanks  Allergies  Allergen Reactions  . Iodine   . Sulfonamide Derivatives     REACTION: throat swelling and itching  . Valtrex [Valacyclovir Hcl]     "made me deathly sick"

## 2013-06-12 ENCOUNTER — Telehealth: Payer: Self-pay | Admitting: Pulmonary Disease

## 2013-06-12 MED ORDER — METHYLPREDNISOLONE 4 MG PO KIT: PACK | ORAL | Status: DC

## 2013-06-12 NOTE — Telephone Encounter (Signed)
Rx sent and pt is aware of recs.Carron Curie, CMA

## 2013-06-12 NOTE — Telephone Encounter (Signed)
Per SN----  Medrol dosepak  #1 Nasal saline mucinex 600 mg  2 po bid

## 2013-06-12 NOTE — Telephone Encounter (Signed)
I spoke with pt. C/o nasal congestion, dry cough, ears feel stopped up, blowing out thick green phlem from nose, feels lousy. No f/c/s/n/v. She has been on 2 rounds of augemntin 05/16/13 and 05/31/13. She reports she is doing saline rinses and using vermyst. Please advise SN thanks  Allergies  Allergen Reactions  . Iodine   . Sulfonamide Derivatives     REACTION: throat swelling and itching  . Valtrex [Valacyclovir Hcl]     "made me deathly sick"

## 2013-08-20 ENCOUNTER — Telehealth: Payer: Self-pay | Admitting: Pulmonary Disease

## 2013-08-20 MED ORDER — ALPRAZOLAM 0.5 MG PO TABS: 0.5000 mg | ORAL_TABLET | Freq: Three times a day (TID) | ORAL | Status: DC | PRN

## 2013-08-20 MED ORDER — FAMCICLOVIR 500 MG PO TABS: 500.0000 mg | ORAL_TABLET | Freq: Three times a day (TID) | ORAL | Status: DC

## 2013-08-20 MED ORDER — METHYLPREDNISOLONE 4 MG PO KIT: PACK | ORAL | Status: DC

## 2013-08-20 NOTE — Telephone Encounter (Signed)
Spoke with pt. Shingles has retuned. This is located in the same location, on her waist right below her bottom. She would like the same medications we gave her last time called in. We gave her Famvir 500mg  TID and Medrol Dose Pack. She would like something called in for her nerves. Pt is raising her 63 year old granddaughter. Was given Xanax in the past and wants to know if we could send in a new prescription.  Allergies  Allergen Reactions  . Iodine   . Sulfonamide Derivatives     REACTION: throat swelling and itching  . Valtrex [Valacyclovir Hcl]     "made me deathly sick"    SN - please advise. Thanks.

## 2013-08-20 NOTE — Telephone Encounter (Signed)
Pt is aware of SN's recs. All prescriptions have been sent/called in to [facility]. Nothing further was needed.

## 2013-08-20 NOTE — Telephone Encounter (Signed)
Per SN---  famvir 500 mg  1 po TID  #21 Medrol dosepak  #1  Take as directed Xanax 0.5 mg  #90   1 po TID prn nerves

## 2013-09-12 ENCOUNTER — Telehealth: Payer: Self-pay | Admitting: Pulmonary Disease

## 2013-09-12 NOTE — Telephone Encounter (Signed)
Called spoke with pt. She was giving # to downstairs to call for appt. Nothing further needed

## 2013-09-13 ENCOUNTER — Telehealth: Payer: Self-pay | Admitting: Pulmonary Disease

## 2013-09-13 MED ORDER — AZITHROMYCIN 250 MG PO TABS: ORAL_TABLET | ORAL | Status: DC

## 2013-09-13 NOTE — Telephone Encounter (Signed)
Called, spoke with pt.  Informed her of below recs per SN.  She verbalized understanding and is aware zpak rx sent to [facility].  She is to call back if symptoms do not improve or worsen and seek emergency care if needed.

## 2013-09-13 NOTE — Telephone Encounter (Signed)
Per SN---  zpak #1  Take as directed mucinex otc 1-2 po bid with plenty of fluids

## 2013-09-13 NOTE — Telephone Encounter (Signed)
Spoke with pt. Reports sore throat, chest tightness and bilateral ear pain. Onset was 2 days. States that she was out in the wind a lot earlier this week. Denies cough. Would like something called in.  Allergies  Allergen Reactions  . Iodine   . Sulfonamide Derivatives     REACTION: throat swelling and itching  . Valtrex [Valacyclovir Hcl]     "made me deathly sick"   SN - please advise. Thanks.

## 2013-10-17 ENCOUNTER — Ambulatory Visit: Payer: BC Managed Care – PPO | Admitting: Physician Assistant

## 2013-10-24 ENCOUNTER — Encounter: Payer: Self-pay | Admitting: Physician Assistant

## 2013-10-24 ENCOUNTER — Ambulatory Visit (INDEPENDENT_AMBULATORY_CARE_PROVIDER_SITE_OTHER): Payer: BC Managed Care – PPO | Admitting: Physician Assistant

## 2013-10-24 VITALS — BP 102/68 | HR 82 | Temp 98.0°F | Resp 14 | Ht 63.0 in | Wt 117.8 lb

## 2013-10-24 DIAGNOSIS — F411 Generalized anxiety disorder: Secondary | ICD-10-CM

## 2013-10-24 DIAGNOSIS — E039 Hypothyroidism, unspecified: Secondary | ICD-10-CM

## 2013-10-24 DIAGNOSIS — J309 Allergic rhinitis, unspecified: Secondary | ICD-10-CM

## 2013-10-24 DIAGNOSIS — IMO0001 Reserved for inherently not codable concepts without codable children: Secondary | ICD-10-CM

## 2013-10-24 DIAGNOSIS — E7800 Pure hypercholesterolemia, unspecified: Secondary | ICD-10-CM

## 2013-10-24 DIAGNOSIS — Z299 Encounter for prophylactic measures, unspecified: Secondary | ICD-10-CM

## 2013-10-24 NOTE — Progress Notes (Signed)
Patient presents to clinic today to establish care.  Acute Concerns: Patient denies acute concern at today's visit. Patient due for CPE in 2 months.  Chronic Issues: Allergic Rhinitis -- occasional symptoms.  Endorses well controlled with PRN antihistamine.  Hypothyroidism -- Currently on 75 mcg Synthroid daily.  Will need repeat TSH at upcoming CPE.  Fibromyalgia -- Patient currently on cyclobenzaprine to help with muscle aches.  Tries to stay active.  Denies worsening symptoms.  Interstitial Cystitis -- Followed by Urology.  Anxiety -- Xanax PRN with good relief of symptoms.  Anxiety is intermittent and usually stems from stressors within the home.  States she has a very difficult daughter.  Denies depressed mood, anhedonia, SI/HI.  Hyperlipidemia -- Currently on Lipitor.  Denies myalgias.  Health Maintenance: Dental -- UTD Vision -- UTD Immunizations -- UTD on required vaccinations.  Defers Zostavax.   Colonoscopy -- Last in 2012; no abnormalities Mammogram -- Followed by OB/GYN.  Last in 2014 PAP -- Followed by OB/GYN. Endorses UTD Bone Density -- UTD  Past Medical History  Diagnosis Date  . Acute asthmatic bronchitis   . Nonspecific abnormal electrocardiogram (ECG) (EKG)   . Hypercholesterolemia   . Hypothyroidism   . Interstitial cystitis   . Fibromyalgia   . Anxiety     Past Surgical History  Procedure Laterality Date  . Total abdominal hysterectomy  1997    Current Outpatient Prescriptions on File Prior to Visit  Medication Sig Dispense Refill  . ALPRAZolam (XANAX) 0.5 MG tablet Take 1 tablet (0.5 mg total) by mouth 3 (three) times daily as needed for anxiety.  90 tablet  0  . aspirin 81 MG tablet Take 81 mg by mouth daily.        Marland Kitchen atorvastatin (LIPITOR) 20 MG tablet Take 1/2 tablet by mouth daily      . calcium carbonate (OS-CAL) 600 MG TABS Take 3 tablets by mouth daily.        . Cholecalciferol (VITAMIN D) 1000 UNITS capsule Take 1,000 Units by mouth  daily.        . Coenzyme Q10 (COQ10) 100 MG CAPS Take 1 capsule by mouth daily.        . cyclobenzaprine (FLEXERIL) 10 MG tablet Take 1 tablet (10 mg total) by mouth daily.  90 tablet  3  . estradiol (ESTRACE) 1 MG tablet Take 0.5 mg by mouth daily.       Marland Kitchen levothyroxine (SYNTHROID, LEVOTHROID) 75 MCG tablet Take 1 tablet (75 mcg total) by mouth daily.  90 tablet  3  . Multiple Vitamins-Minerals (WOMENS MULTIVITAMIN PLUS PO) Take 1 tablet by mouth daily.        . vitamin C (ASCORBIC ACID) 500 MG tablet Take 1,000 mg by mouth daily.       . vitamin E 400 UNIT capsule Take 400 Units by mouth daily.         No current facility-administered medications on file prior to visit.    Allergies  Allergen Reactions  . Iodine   . Sulfonamide Derivatives     REACTION: throat swelling and itching  . Valtrex [Valacyclovir Hcl]     "made me deathly sick"    Family History  Problem Relation Age of Onset  . Alzheimer's disease Father   . Stroke Father   . Heart failure Father   . Other Mother     bronchiectasis  . Pneumonia Mother   . Heart failure Mother   . Pancreatic cancer Brother   .  Hyperlipidemia Sister   . Other Sister     bronchiectasis    History   Social History  . Marital Status: Divorced    Spouse Name: N/A    Number of Children: 3  . Years of Education: N/A   Occupational History  . school teacher - substitute teaching now    Social History Main Topics  . Smoking status: Never Smoker   . Smokeless tobacco: Never Used  . Alcohol Use: No  . Drug Use: No  . Sexual Activity: Not on file   Other Topics Concern  . Not on file   Social History Narrative   Plays piano, 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   Declines flu and pneumonia shot 04-30-10   ROS See HPI.  All other ROS are negative.  BP 102/68  Pulse 82  Temp(Src) 98 F (36.7 C) (Oral)  Resp 14  Ht 5\' 3"  (1.6 m)  Wt 117 lb 12 oz (53.411 kg)  BMI 20.86 kg/m2  SpO2 99%  Physical Exam  Vitals  reviewed. Constitutional: She is oriented to person, place, and time and well-developed, well-nourished, and in no distress.  HENT:  Head: Normocephalic and atraumatic.  Right Ear: External ear normal.  Left Ear: External ear normal.  Nose: Nose normal.  Mouth/Throat: Oropharynx is clear and moist. No oropharyngeal exudate.  TM within normal limits bilaterally.  Eyes: Conjunctivae are normal. Pupils are equal, round, and reactive to light.  Neck: Neck supple. No thyromegaly present.  Cardiovascular: Normal rate, regular rhythm, normal heart sounds and intact distal pulses.   Pulmonary/Chest: Effort normal and breath sounds normal. No respiratory distress. She has no wheezes. She has no rales. She exhibits no tenderness.  Lymphadenopathy:    She has no cervical adenopathy.  Neurological: She is alert and oriented to person, place, and time.  Skin: Skin is warm and dry. No rash noted.  Psychiatric: Affect normal.   Assessment/Plan: ALLERGIC RHINITIS Well-controlled with PRN OTC preparations.  HYPOTHYROIDISM Continue current regimen.  Patient to return for CPE with fasting labs.  Will recheck thyroid function at that visit.  Labs already placed.  FIBROMYALGIA Continue Flexeril as directed.  ANXIETY PRN xanax use.  Call or return to clinic if symptoms worsen.  HYPERCHOLESTEROLEMIA Continue Lipitor.  Will recheck fasting lipid panel.

## 2013-10-24 NOTE — Progress Notes (Signed)
Pre visit review using our clinic review tool, if applicable. No additional management support is needed unless otherwise documented below in the visit note/SLS  

## 2013-10-24 NOTE — Patient Instructions (Signed)
Please continue medications as directed.  Try using Flonase for allergic rhinitis and nasal congestion.  Please schedule an appointment for a complete physical.  Come in to the office 5-7 days prior to obtain labs.  We will discuss these at your visit.  Preventive Care for Adults, Female A healthy lifestyle and preventive care can promote health and wellness. Preventive health guidelines for women include the following key practices.  A routine yearly physical is a good way to check with your health care provider about your health and preventive screening. It is a chance to share any concerns and updates on your health and to receive a thorough exam.  Visit your dentist for a routine exam and preventive care every 6 months. Brush your teeth twice a day and floss once a day. Good oral hygiene prevents tooth decay and gum disease.  The frequency of eye exams is based on your age, health, family medical history, use of contact lenses, and other factors. Follow your health care provider's recommendations for frequency of eye exams.  Eat a healthy diet. Foods like vegetables, fruits, whole grains, low-fat dairy products, and lean protein foods contain the nutrients you need without too many calories. Decrease your intake of foods high in solid fats, added sugars, and salt. Eat the right amount of calories for you.Get information about a proper diet from your health care provider, if necessary.  Regular physical exercise is one of the most important things you can do for your health. Most adults should get at least 150 minutes of moderate-intensity exercise (any activity that increases your heart rate and causes you to sweat) each week. In addition, most adults need muscle-strengthening exercises on 2 or more days a week.  Maintain a healthy weight. The body mass index (BMI) is a screening tool to identify possible weight problems. It provides an estimate of body fat based on height and weight. Your health  care provider can find your BMI, and can help you achieve or maintain a healthy weight.For adults 20 years and older:  A BMI below 18.5 is considered underweight.  A BMI of 18.5 to 24.9 is normal.  A BMI of 25 to 29.9 is considered overweight.  A BMI of 30 and above is considered obese.  Maintain normal blood lipids and cholesterol levels by exercising and minimizing your intake of saturated fat. Eat a balanced diet with plenty of fruit and vegetables. Blood tests for lipids and cholesterol should begin at age 6 and be repeated every 5 years. If your lipid or cholesterol levels are high, you are over 50, or you are at high risk for heart disease, you may need your cholesterol levels checked more frequently.Ongoing high lipid and cholesterol levels should be treated with medicines if diet and exercise are not working.  If you smoke, find out from your health care provider how to quit. If you do not use tobacco, do not start.  Lung cancer screening is recommended for adults aged 24 80 years who are at high risk for developing lung cancer because of a history of smoking. A yearly low-dose CT scan of the lungs is recommended for people who have at least a 30-pack-year history of smoking and are a current smoker or have quit within the past 15 years. A pack year of smoking is smoking an average of 1 pack of cigarettes a day for 1 year (for example: 1 pack a day for 30 years or 2 packs a day for 15 years). Yearly screening  should continue until the smoker has stopped smoking for at least 15 years. Yearly screening should be stopped for people who develop a health problem that would prevent them from having lung cancer treatment.  If you are pregnant, do not drink alcohol. If you are breastfeeding, be very cautious about drinking alcohol. If you are not pregnant and choose to drink alcohol, do not have more than 1 drink per day. One drink is considered to be 12 ounces (355 mL) of beer, 5 ounces (148 mL)  of wine, or 1.5 ounces (44 mL) of liquor.  Avoid use of street drugs. Do not share needles with anyone. Ask for help if you need support or instructions about stopping the use of drugs.  High blood pressure causes heart disease and increases the risk of stroke. Your blood pressure should be checked at least every 1 to 2 years. Ongoing high blood pressure should be treated with medicines if weight loss and exercise do not work.  If you are 29 63 years old, ask your health care provider if you should take aspirin to prevent strokes.  Diabetes screening involves taking a blood sample to check your fasting blood sugar level. This should be done once every 3 years, after age 1, if you are within normal weight and without risk factors for diabetes. Testing should be considered at a younger age or be carried out more frequently if you are overweight and have at least 1 risk factor for diabetes.  Breast cancer screening is essential preventive care for women. You should practice "breast self-awareness." This means understanding the normal appearance and feel of your breasts and may include breast self-examination. Any changes detected, no matter how small, should be reported to a health care provider. Women in their 68s and 30s should have a clinical breast exam (CBE) by a health care provider as part of a regular health exam every 1 to 3 years. After age 1, women should have a CBE every year. Starting at age 57, women should consider having a mammogram (breast X-ray test) every year. Women who have a family history of breast cancer should talk to their health care provider about genetic screening. Women at a high risk of breast cancer should talk to their health care providers about having an MRI and a mammogram every year.  Breast cancer gene (BRCA)-related cancer risk assessment is recommended for women who have family members with BRCA-related cancers. BRCA-related cancers include breast, ovarian, tubal,  and peritoneal cancers. Having family members with these cancers may be associated with an increased risk for harmful changes (mutations) in the breast cancer genes BRCA1 and BRCA2. Results of the assessment will determine the need for genetic counseling and BRCA1 and BRCA2 testing.  The Pap test is a screening test for cervical cancer. A Pap test can show cell changes on the cervix that might become cervical cancer if left untreated. A Pap test is a procedure in which cells are obtained and examined from the lower end of the uterus (cervix).  Women should have a Pap test starting at age 55.  Between ages 38 and 67, Pap tests should be repeated every 2 years.  Beginning at age 78, you should have a Pap test every 3 years as long as the past 3 Pap tests have been normal.  Some women have medical problems that increase the chance of getting cervical cancer. Talk to your health care provider about these problems. It is especially important to talk to your health  care provider if a new problem develops soon after your last Pap test. In these cases, your health care provider may recommend more frequent screening and Pap tests.  The above recommendations are the same for women who have or have not gotten the vaccine for human papillomavirus (HPV).  If you had a hysterectomy for a problem that was not cancer or a condition that could lead to cancer, then you no longer need Pap tests. Even if you no longer need a Pap test, a regular exam is a good idea to make sure no other problems are starting.  If you are between ages 2 and 10 years, and you have had normal Pap tests going back 10 years, you no longer need Pap tests. Even if you no longer need a Pap test, a regular exam is a good idea to make sure no other problems are starting.  If you have had past treatment for cervical cancer or a condition that could lead to cancer, you need Pap tests and screening for cancer for at least 20 years after your  treatment.  If Pap tests have been discontinued, risk factors (such as a new sexual partner) need to be reassessed to determine if screening should be resumed.  The HPV test is an additional test that may be used for cervical cancer screening. The HPV test looks for the virus that can cause the cell changes on the cervix. The cells collected during the Pap test can be tested for HPV. The HPV test could be used to screen women aged 52 years and older, and should be used in women of any age who have unclear Pap test results. After the age of 37, women should have HPV testing at the same frequency as a Pap test.  Colorectal cancer can be detected and often prevented. Most routine colorectal cancer screening begins at the age of 34 years and continues through age 70 years. However, your health care provider may recommend screening at an earlier age if you have risk factors for colon cancer. On a yearly basis, your health care provider may provide home test kits to check for hidden blood in the stool. Use of a small camera at the end of a tube, to directly examine the colon (sigmoidoscopy or colonoscopy), can detect the earliest forms of colorectal cancer. Talk to your health care provider about this at age 65, when routine screening begins. Direct exam of the colon should be repeated every 5 10 years through age 70 years, unless early forms of pre-cancerous polyps or small growths are found.  People who are at an increased risk for hepatitis B should be screened for this virus. You are considered at high risk for hepatitis B if:  You were born in a country where hepatitis B occurs often. Talk with your health care provider about which countries are considered high risk.  Your parents were born in a high-risk country and you have not received a shot to protect against hepatitis B (hepatitis B vaccine).  You have HIV or AIDS.  You use needles to inject street drugs.  You live with, or have sex with,  someone who has Hepatitis B.  You get hemodialysis treatment.  You take certain medicines for conditions like cancer, organ transplantation, and autoimmune conditions.  Hepatitis C blood testing is recommended for all people born from [birth year] through 1965 and any individual with known risks for hepatitis C.  Practice safe sex. Use condoms and avoid high-risk sexual practices to  reduce the spread of sexually transmitted infections (STIs). STIs include gonorrhea, chlamydia, syphilis, trichomonas, herpes, HPV, and human immunodeficiency virus (HIV). Herpes, HIV, and HPV are viral illnesses that have no cure. They can result in disability, cancer, and death. Sexually active women aged 65 years and younger should be checked for chlamydia. Older women with new or multiple partners should also be tested for chlamydia. Testing for other STIs is recommended if you are sexually active and at increased risk.  Osteoporosis is a disease in which the bones lose minerals and strength with aging. This can result in serious bone fractures or breaks. The risk of osteoporosis can be identified using a bone density scan. Women ages 22 years and over and women at risk for fractures or osteoporosis should discuss screening with their health care providers. Ask your health care provider whether you should take a calcium supplement or vitamin D to reduce the rate of osteoporosis.  Menopause can be associated with physical symptoms and risks. Hormone replacement therapy is available to decrease symptoms and risks. You should talk to your health care provider about whether hormone replacement therapy is right for you.  Use sunscreen. Apply sunscreen liberally and repeatedly throughout the day. You should seek shade when your shadow is shorter than you. Protect yourself by wearing long sleeves, pants, a wide-brimmed hat, and sunglasses year round, whenever you are outdoors.  Once a month, do a whole body skin exam, using a mirror  to look at the skin on your back. Tell your health care provider of new moles, moles that have irregular borders, moles that are larger than a pencil eraser, or moles that have changed in shape or color.  Stay current with required vaccines (immunizations).  Influenza vaccine. All adults should be immunized every year.  Tetanus, diphtheria, and acellular pertussis (Td, Tdap) vaccine. Pregnant women should receive 1 dose of Tdap vaccine during each pregnancy. The dose should be obtained regardless of the length of time since the last dose. Immunization is preferred during the 27th 36th week of gestation. An adult who has not previously received Tdap or who does not know her vaccine status should receive 1 dose of Tdap. This initial dose should be followed by tetanus and diphtheria toxoids (Td) booster doses every 10 years. Adults with an unknown or incomplete history of completing a 3-dose immunization series with Td-containing vaccines should begin or complete a primary immunization series including a Tdap dose. Adults should receive a Td booster every 10 years.  Varicella vaccine. An adult without evidence of immunity to varicella should receive 2 doses or a second dose if she has previously received 1 dose. Pregnant females who do not have evidence of immunity should receive the first dose after pregnancy. This first dose should be obtained before leaving the health care facility. The second dose should be obtained 4 8 weeks after the first dose.  Human papillomavirus (HPV) vaccine. Females aged 79 26 years who have not received the vaccine previously should obtain the 3-dose series. The vaccine is not recommended for use in pregnant females. However, pregnancy testing is not needed before receiving a dose. If a female is found to be pregnant after receiving a dose, no treatment is needed. In that case, the remaining doses should be delayed until after the pregnancy. Immunization is recommended for any  person with an immunocompromised condition through the age of 56 years if she did not get any or all doses earlier. During the 3-dose series, the second dose  should be obtained 4 8 weeks after the first dose. The third dose should be obtained 24 weeks after the first dose and 16 weeks after the second dose.  Zoster vaccine. One dose is recommended for adults aged 41 years or older unless certain conditions are present.  Measles, mumps, and rubella (MMR) vaccine. Adults born before 65 generally are considered immune to measles and mumps. Adults born in [birth year] or later should have 1 or more doses of MMR vaccine unless there is a contraindication to the vaccine or there is laboratory evidence of immunity to each of the three diseases. A routine second dose of MMR vaccine should be obtained at least 28 days after the first dose for students attending postsecondary schools, health care workers, or international travelers. People who received inactivated measles vaccine or an unknown type of measles vaccine during 1963 1967 should receive 2 doses of MMR vaccine. People who received inactivated mumps vaccine or an unknown type of mumps vaccine before 1979 and are at high risk for mumps infection should consider immunization with 2 doses of MMR vaccine. For females of childbearing age, rubella immunity should be determined. If there is no evidence of immunity, females who are not pregnant should be vaccinated. If there is no evidence of immunity, females who are pregnant should delay immunization until after pregnancy. Unvaccinated health care workers born before [birth year] who lack laboratory evidence of measles, mumps, or rubella immunity or laboratory confirmation of disease should consider measles and mumps immunization with 2 doses of MMR vaccine or rubella immunization with 1 dose of MMR vaccine.  Pneumococcal 13-valent conjugate (PCV13) vaccine. When indicated, a person who is uncertain of her immunization history  and has no record of immunization should receive the PCV13 vaccine. An adult aged 44 years or older who has certain medical conditions and has not been previously immunized should receive 1 dose of PCV13 vaccine. This PCV13 should be followed with a dose of pneumococcal polysaccharide (PPSV23) vaccine. The PPSV23 vaccine dose should be obtained at least 8 weeks after the dose of PCV13 vaccine. An adult aged 37 years or older who has certain medical conditions and previously received 1 or more doses of PPSV23 vaccine should receive 1 dose of PCV13. The PCV13 vaccine dose should be obtained 1 or more years after the last PPSV23 vaccine dose.  Pneumococcal polysaccharide (PPSV23) vaccine. When PCV13 is also indicated, PCV13 should be obtained first. All adults aged 21 years and older should be immunized. An adult younger than age 43 years who has certain medical conditions should be immunized. Any person who resides in a nursing home or long-term care facility should be immunized. An adult smoker should be immunized. People with an immunocompromised condition and certain other conditions should receive both PCV13 and PPSV23 vaccines. People with human immunodeficiency virus (HIV) infection should be immunized as soon as possible after diagnosis. Immunization during chemotherapy or radiation therapy should be avoided. Routine use of PPSV23 vaccine is not recommended for American Indians, [place] Natives, or people younger than 65 years unless there are medical conditions that require PPSV23 vaccine. When indicated, people who have unknown immunization and have no record of immunization should receive PPSV23 vaccine. One-time revaccination 5 years after the first dose of PPSV23 is recommended for people aged 86 64 years who have chronic kidney failure, nephrotic syndrome, asplenia, or immunocompromised conditions. People who received 1 2 doses of PPSV23 before age 22 years should receive another dose of PPSV23 vaccine  at age 64  years or later if at least 5 years have passed since the previous dose. Doses of PPSV23 are not needed for people immunized with PPSV23 at or after age 75 years.  Meningococcal vaccine. Adults with asplenia or persistent complement component deficiencies should receive 2 doses of quadrivalent meningococcal conjugate (MenACWY-D) vaccine. The doses should be obtained at least 2 months apart. Microbiologists working with certain meningococcal bacteria, [employer] recruits, people at risk during an outbreak, and people who travel to or live in countries with a high rate of meningitis should be immunized. A first-year college student up through age 2 years who is living in a residence hall should receive a dose if she did not receive a dose on or after her 16th birthday. Adults who have certain high-risk conditions should receive one or more doses of vaccine.  Hepatitis A vaccine. Adults who wish to be protected from this disease, have certain high-risk conditions, work with hepatitis A-infected animals, work in hepatitis A research labs, or travel to or work in countries with a high rate of hepatitis A should be immunized. Adults who were previously unvaccinated and who anticipate close contact with an international adoptee during the first 60 days after arrival in the [place] from a country with a high rate of hepatitis A should be immunized.  Hepatitis B vaccine. Adults who wish to be protected from this disease, have certain high-risk conditions, may be exposed to blood or other infectious body fluids, are household contacts or sex partners of hepatitis B positive people, are clients or workers in certain care facilities, or travel to or work in countries with a high rate of hepatitis B should be immunized.  Haemophilus influenzae type b (Hib) vaccine. A previously unvaccinated person with asplenia or sickle cell disease or having a scheduled splenectomy should receive 1 dose of Hib vaccine.  Regardless of previous immunization, a recipient of a hematopoietic stem cell transplant should receive a 3-dose series 6 12 months after her successful transplant. Hib vaccine is not recommended for adults with HIV infection. Preventive Services / Frequency Ages 62 to 39years  Blood pressure check.** / Every 1 to 2 years.  Lipid and cholesterol check.** / Every 5 years beginning at age 60.  Clinical breast exam.** / Every 3 years for women in their 34s and 1s.  BRCA-related cancer risk assessment.** / For women who have family members with a BRCA-related cancer (breast, ovarian, tubal, or peritoneal cancers).  Pap test.** / Every 2 years from ages 86 through 103. Every 3 years starting at age 73 through age 13 or 22 with a history of 3 consecutive normal Pap tests.  HPV screening.** / Every 3 years from ages 7 through ages 96 to 31 with a history of 3 consecutive normal Pap tests.  Hepatitis C blood test.** / For any individual with known risks for hepatitis C.  Skin self-exam. / Monthly.  Influenza vaccine. / Every year.  Tetanus, diphtheria, and acellular pertussis (Tdap, Td) vaccine.** / Consult your health care provider. Pregnant women should receive 1 dose of Tdap vaccine during each pregnancy. 1 dose of Td every 10 years.  Varicella vaccine.** / Consult your health care provider. Pregnant females who do not have evidence of immunity should receive the first dose after pregnancy.  HPV vaccine. / 3 doses over 6 months, if 63 and younger. The vaccine is not recommended for use in pregnant females. However, pregnancy testing is not needed before receiving a dose.  Measles, mumps, rubella (MMR) vaccine.** /  You need at least 1 dose of MMR if you were born in 1957 or later. You may also need a 2nd dose. For females of childbearing age, rubella immunity should be determined. If there is no evidence of immunity, females who are not pregnant should be vaccinated. If there is no evidence of  immunity, females who are pregnant should delay immunization until after pregnancy.  Pneumococcal 13-valent conjugate (PCV13) vaccine.** / Consult your health care provider.  Pneumococcal polysaccharide (PPSV23) vaccine.** / 1 to 2 doses if you smoke cigarettes or if you have certain conditions.  Meningococcal vaccine.** / 1 dose if you are age 32 to 34 years and a Market researcher living in a residence hall, or have one of several medical conditions, you need to get vaccinated against meningococcal disease. You may also need additional booster doses.  Hepatitis A vaccine.** / Consult your health care provider.  Hepatitis B vaccine.** / Consult your health care provider.  Haemophilus influenzae type b (Hib) vaccine.** / Consult your health care provider. Ages 37 to 64years  Blood pressure check.** / Every 1 to 2 years.  Lipid and cholesterol check.** / Every 5 years beginning at age 30 years.  Lung cancer screening. / Every year if you are aged 64 80 years and have a 30-pack-year history of smoking and currently smoke or have quit within the past 15 years. Yearly screening is stopped once you have quit smoking for at least 15 years or develop a health problem that would prevent you from having lung cancer treatment.  Clinical breast exam.** / Every year after age 59 years.  BRCA-related cancer risk assessment.** / For women who have family members with a BRCA-related cancer (breast, ovarian, tubal, or peritoneal cancers).  Mammogram.** / Every year beginning at age 17 years and continuing for as long as you are in good health. Consult with your health care provider.  Pap test.** / Every 3 years starting at age 49 years through age 26 or 45 years with a history of 3 consecutive normal Pap tests.  HPV screening.** / Every 3 years from ages 35 years through ages 68 to 52 years with a history of 3 consecutive normal Pap tests.  Fecal occult blood test (FOBT) of stool. / Every  year beginning at age 59 years and continuing until age 63 years. You may not need to do this test if you get a colonoscopy every 10 years.  Flexible sigmoidoscopy or colonoscopy.** / Every 5 years for a flexible sigmoidoscopy or every 10 years for a colonoscopy beginning at age 19 years and continuing until age 70 years.  Hepatitis C blood test.** / For all people born from [birth year] through 1965 and any individual with known risks for hepatitis C.  Skin self-exam. / Monthly.  Influenza vaccine. / Every year.  Tetanus, diphtheria, and acellular pertussis (Tdap/Td) vaccine.** / Consult your health care provider. Pregnant women should receive 1 dose of Tdap vaccine during each pregnancy. 1 dose of Td every 10 years.  Varicella vaccine.** / Consult your health care provider. Pregnant females who do not have evidence of immunity should receive the first dose after pregnancy.  Zoster vaccine.** / 1 dose for adults aged 40 years or older.  Measles, mumps, rubella (MMR) vaccine.** / You need at least 1 dose of MMR if you were born in 1957 or later. You may also need a 2nd dose. For females of childbearing age, rubella immunity should be determined. If there is no evidence of immunity,  females who are not pregnant should be vaccinated. If there is no evidence of immunity, females who are pregnant should delay immunization until after pregnancy.  Pneumococcal 13-valent conjugate (PCV13) vaccine.** / Consult your health care provider.  Pneumococcal polysaccharide (PPSV23) vaccine.** / 1 to 2 doses if you smoke cigarettes or if you have certain conditions.  Meningococcal vaccine.** / Consult your health care provider.  Hepatitis A vaccine.** / Consult your health care provider.  Hepatitis B vaccine.** / Consult your health care provider.  Haemophilus influenzae type b (Hib) vaccine.** / Consult your health care provider. Ages 42 years and over  Blood pressure check.** / Every 1 to 2 years.  Lipid  and cholesterol check.** / Every 5 years beginning at age 48 years.  Lung cancer screening. / Every year if you are aged 46 80 years and have a 30-pack-year history of smoking and currently smoke or have quit within the past 15 years. Yearly screening is stopped once you have quit smoking for at least 15 years or develop a health problem that would prevent you from having lung cancer treatment.  Clinical breast exam.** / Every year after age 70 years.  BRCA-related cancer risk assessment.** / For women who have family members with a BRCA-related cancer (breast, ovarian, tubal, or peritoneal cancers).  Mammogram.** / Every year beginning at age 32 years and continuing for as long as you are in good health. Consult with your health care provider.  Pap test.** / Every 3 years starting at age 32 years through age 55 or 102 years with 3 consecutive normal Pap tests. Testing can be stopped between 65 and 70 years with 3 consecutive normal Pap tests and no abnormal Pap or HPV tests in the past 10 years.  HPV screening.** / Every 3 years from ages 17 years through ages 15 or 17 years with a history of 3 consecutive normal Pap tests. Testing can be stopped between 65 and 70 years with 3 consecutive normal Pap tests and no abnormal Pap or HPV tests in the past 10 years.  Fecal occult blood test (FOBT) of stool. / Every year beginning at age 61 years and continuing until age 65 years. You may not need to do this test if you get a colonoscopy every 10 years.  Flexible sigmoidoscopy or colonoscopy.** / Every 5 years for a flexible sigmoidoscopy or every 10 years for a colonoscopy beginning at age 97 years and continuing until age 53 years.  Hepatitis C blood test.** / For all people born from [birth year] through 1965 and any individual with known risks for hepatitis C.  Osteoporosis screening.** / A one-time screening for women ages 29 years and over and women at risk for fractures or osteoporosis.  Skin  self-exam. / Monthly.  Influenza vaccine. / Every year.  Tetanus, diphtheria, and acellular pertussis (Tdap/Td) vaccine.** / 1 dose of Td every 10 years.  Varicella vaccine.** / Consult your health care provider.  Zoster vaccine.** / 1 dose for adults aged 71 years or older.  Pneumococcal 13-valent conjugate (PCV13) vaccine.** / Consult your health care provider.  Pneumococcal polysaccharide (PPSV23) vaccine.** / 1 dose for all adults aged 60 years and older.  Meningococcal vaccine.** / Consult your health care provider.  Hepatitis A vaccine.** / Consult your health care provider.  Hepatitis B vaccine.** / Consult your health care provider.  Haemophilus influenzae type b (Hib) vaccine.** / Consult your health care provider. ** Family history and personal history of risk and conditions may change your  health care provider's recommendations. Document Released: 07/27/2001 Document Revised: 03/21/2013 Document Reviewed: 10/26/2010 [facility] Patient Information 2014 [place], [place].

## 2013-10-25 NOTE — Assessment & Plan Note (Signed)
Well-controlled with PRN OTC preparations.

## 2013-10-25 NOTE — Assessment & Plan Note (Signed)
Continue current regimen.  Patient to return for CPE with fasting labs.  Will recheck thyroid function at that visit.  Labs already placed.

## 2013-10-25 NOTE — Assessment & Plan Note (Signed)
Continue Lipitor.  Will recheck fasting lipid panel.

## 2013-10-25 NOTE — Assessment & Plan Note (Signed)
PRN xanax use.  Call or return to clinic if symptoms worsen.

## 2013-10-25 NOTE — Assessment & Plan Note (Signed)
Continue Flexeril as directed.

## 2013-11-28 ENCOUNTER — Other Ambulatory Visit: Payer: Self-pay | Admitting: *Deleted

## 2013-11-28 MED ORDER — CYCLOBENZAPRINE HCL 10 MG PO TABS: 10.0000 mg | ORAL_TABLET | Freq: Every day | ORAL | Status: DC

## 2013-11-28 NOTE — Progress Notes (Signed)
Pt request for Short Term Supply of medication until she comes in for OV and gets 90-day to mail order/SLS

## 2013-12-10 LAB — CBC WITH DIFFERENTIAL/PLATELET
BASOS ABS: 0 10*3/uL (ref 0.0–0.1)
BASOS PCT: 1 % (ref 0–1)
Eosinophils Absolute: 0.2 10*3/uL (ref 0.0–0.7)
Eosinophils Relative: 4 % (ref 0–5)
HCT: 39.4 % (ref 36.0–46.0)
HEMOGLOBIN: 13.4 g/dL (ref 12.0–15.0)
Lymphocytes Relative: 30 % (ref 12–46)
Lymphs Abs: 1.4 10*3/uL (ref 0.7–4.0)
MCH: 30.5 pg (ref 26.0–34.0)
MCHC: 34 g/dL (ref 30.0–36.0)
MCV: 89.5 fL (ref 78.0–100.0)
Monocytes Absolute: 0.4 10*3/uL (ref 0.1–1.0)
Monocytes Relative: 8 % (ref 3–12)
NEUTROS ABS: 2.7 10*3/uL (ref 1.7–7.7)
Neutrophils Relative %: 57 % (ref 43–77)
PLATELETS: 293 10*3/uL (ref 150–400)
RBC: 4.4 MIL/uL (ref 3.87–5.11)
RDW: 12.8 % (ref 11.5–15.5)
WBC: 4.8 10*3/uL (ref 4.0–10.5)

## 2013-12-10 LAB — BASIC METABOLIC PANEL
BUN: 17 mg/dL (ref 6–23)
CALCIUM: 9 mg/dL (ref 8.4–10.5)
CO2: 27 mEq/L (ref 19–32)
Chloride: 104 mEq/L (ref 96–112)
Creat: 0.73 mg/dL (ref 0.50–1.10)
Glucose, Bld: 89 mg/dL (ref 70–99)
Potassium: 4 mEq/L (ref 3.5–5.3)
SODIUM: 140 meq/L (ref 135–145)

## 2013-12-10 LAB — URINALYSIS, MICROSCOPIC ONLY
Bacteria, UA: NONE SEEN
Casts: NONE SEEN
Crystals: NONE SEEN
Squamous Epithelial / LPF: NONE SEEN

## 2013-12-10 LAB — HEPATIC FUNCTION PANEL
ALK PHOS: 85 U/L (ref 39–117)
ALT: 21 U/L (ref 0–35)
AST: 24 U/L (ref 0–37)
Albumin: 4.1 g/dL (ref 3.5–5.2)
BILIRUBIN INDIRECT: 0.4 mg/dL (ref 0.2–1.2)
BILIRUBIN TOTAL: 0.5 mg/dL (ref 0.2–1.2)
Bilirubin, Direct: 0.1 mg/dL (ref 0.0–0.3)
Total Protein: 6.5 g/dL (ref 6.0–8.3)

## 2013-12-10 LAB — LIPID PANEL
CHOL/HDL RATIO: 2.8 ratio
CHOLESTEROL: 153 mg/dL (ref 0–200)
HDL: 55 mg/dL (ref >39)
LDL Cholesterol: 76 mg/dL (ref 0–99)
TRIGLYCERIDES: 108 mg/dL (ref <150)
VLDL: 22 mg/dL (ref 0–40)

## 2013-12-10 LAB — HEMOGLOBIN A1C
Hgb A1c MFr Bld: 6 % — ABNORMAL HIGH (ref <5.7)
MEAN PLASMA GLUCOSE: 126 mg/dL — AB (ref <117)

## 2013-12-10 LAB — TSH: TSH: 1.432 u[IU]/mL (ref 0.350–4.500)

## 2013-12-13 ENCOUNTER — Encounter: Payer: Self-pay | Admitting: Physician Assistant

## 2013-12-13 ENCOUNTER — Ambulatory Visit (INDEPENDENT_AMBULATORY_CARE_PROVIDER_SITE_OTHER): Payer: BC Managed Care – PPO | Admitting: Physician Assistant

## 2013-12-13 VITALS — BP 108/68 | HR 83 | Temp 98.0°F | Resp 14 | Ht 63.0 in | Wt 116.0 lb

## 2013-12-13 DIAGNOSIS — Z136 Encounter for screening for cardiovascular disorders: Secondary | ICD-10-CM

## 2013-12-13 DIAGNOSIS — R9431 Abnormal electrocardiogram [ECG] [EKG]: Secondary | ICD-10-CM

## 2013-12-13 DIAGNOSIS — Z0000 Encounter for general adult medical examination without abnormal findings: Secondary | ICD-10-CM

## 2013-12-13 MED ORDER — CYCLOBENZAPRINE HCL 10 MG PO TABS: 10.0000 mg | ORAL_TABLET | Freq: Every day | ORAL | Status: DC

## 2013-12-13 MED ORDER — LEVOTHYROXINE SODIUM 75 MCG PO TABS: 75.0000 ug | ORAL_TABLET | Freq: Every day | ORAL | Status: DC

## 2013-12-13 MED ORDER — ATORVASTATIN CALCIUM 20 MG PO TABS: 10.0000 mg | ORAL_TABLET | Freq: Every day | ORAL | Status: DC

## 2013-12-13 NOTE — Progress Notes (Deleted)
   Subjective:    Patient ID: Jaclyn Bennett, female    DOB: 10-17-1950, 63 y.o.   MRN: [id number]  HPI    Review of Systems     Objective:   Physical Exam        Assessment & Plan:

## 2013-12-13 NOTE — Progress Notes (Signed)
Patient presents to clinic today for annual exam.  Patient is fasting for labs.  Acute Concerns: No acute concerns at today's visit.  Chronic Issues: Allergic Rhinitis -- occasional symptoms. Endorses well controlled with PRN antihistamine.   Hypothyroidism -- Currently on 75 mcg Synthroid daily. Will need repeat TSH at upcoming CPE.   Fibromyalgia -- Patient currently on cyclobenzaprine to help with muscle aches. Tries to stay active. Denies worsening symptoms.   Interstitial Cystitis -- Followed by Urology.   Anxiety -- Xanax PRN with good relief of symptoms. Anxiety is intermittent and usually stems from stressors within the home. States she has a very difficult daughter. Denies depressed mood, anhedonia, SI/HI.   Hyperlipidemia -- Currently on Lipitor. Denies myalgias   Health Maintenance: Dental -- UTD Vision -- UTD Immunizations -- UTD on required vaccinations.  Defers Zostavax. Colonoscopy -- Last in 2012; no abnormalities.  Mammogram -- Due in August.  Patient sets up own appointment with the breast center. PAP -- Followed by OB/GYN.  Bone Density -- Bone density in 2014.   Past Medical History  Diagnosis Date  . Acute asthmatic bronchitis   . Nonspecific abnormal electrocardiogram (ECG) (EKG)   . Hypercholesterolemia   . Hypothyroidism   . Interstitial cystitis   . Fibromyalgia   . Anxiety     Past Surgical History  Procedure Laterality Date  . Total abdominal hysterectomy  1997    Current Outpatient Prescriptions on File Prior to Visit  Medication Sig Dispense Refill  . ALPRAZolam (XANAX) 0.5 MG tablet Take 1 tablet (0.5 mg total) by mouth 3 (three) times daily as needed for anxiety.  90 tablet  0  . aspirin 81 MG tablet Take 81 mg by mouth daily.        . benzonatate (TESSALON) 200 MG capsule Take 200 mg by mouth 3 (three) times daily as needed for cough (Maintence Medication).      . calcium carbonate (OS-CAL) 600 MG TABS Take 3 tablets by mouth daily.         . Cholecalciferol (VITAMIN D) 1000 UNITS capsule Take 1,000 Units by mouth daily.        . Coconut Oil 1000 MG CAPS Take 1 each by mouth daily.      . Coenzyme Q10 (COQ10) 100 MG CAPS Take 1 capsule by mouth daily.        . Diphenhyd-Hydrocort-Nystatin (FIRST-DUKES MOUTHWASH) SUSP 1 tsp gargle and swallow four times daily as needed-Maintenance Medication      . estradiol (ESTRACE) 1 MG tablet Take 0.5 mg by mouth daily.       . famciclovir (FAMVIR) 500 MG tablet Take 500 mg by mouth 3 (three) times daily. X 7 days-Maintenance Medication      . Multiple Vitamins-Minerals (WOMENS MULTIVITAMIN PLUS PO) Take 1 tablet by mouth daily.        . NON FORMULARY Take 3 each by mouth daily. Bone-Up: Calcium Supplement      . vitamin C (ASCORBIC ACID) 500 MG tablet Take 1,000 mg by mouth daily.       . vitamin E 400 UNIT capsule Take 400 Units by mouth daily.         No current facility-administered medications on file prior to visit.    Allergies  Allergen Reactions  . Iodine   . Sulfonamide Derivatives     REACTION: throat swelling and itching  . Valtrex [Valacyclovir Hcl]     "made me deathly sick"    Family History  Problem  Relation Age of Onset  . Alzheimer's disease Father   . Stroke Father   . Heart failure Father   . Other Mother     bronchiectasis  . Pneumonia Mother   . Heart failure Mother   . Pancreatic cancer Brother   . Hyperlipidemia Sister   . Other Sister     bronchiectasis    History   Social History  . Marital Status: Divorced    Spouse Name: N/A    Number of Children: 3  . Years of Education: N/A   Occupational History  . school teacher - substitute teaching now    Social History Main Topics  . Smoking status: Never Smoker   . Smokeless tobacco: Never Used  . Alcohol Use: No  . Drug Use: No  . Sexual Activity: Not on file   Other Topics Concern  . Not on file   Social History Narrative   Plays piano, 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   Declines flu and  pneumonia shot 04-30-10    Review of Systems  Constitutional: Negative for fever and weight loss.  HENT: Negative for ear discharge, ear pain, hearing loss and tinnitus.   Eyes: Negative for blurred vision, double vision, photophobia and pain.  Respiratory: Negative for cough and shortness of breath.   Cardiovascular: Negative for chest pain and palpitations.  Gastrointestinal: Negative.   Genitourinary: Negative.   Neurological: Negative for dizziness, loss of consciousness and headaches.  Endo/Heme/Allergies: Positive for environmental allergies.  Psychiatric/Behavioral: Negative.     BP 108/68  Pulse 83  Temp(Src) 98 F (36.7 C) (Oral)  Resp 14  Ht 5' 3"  (1.6 m)  Wt 116 lb (52.617 kg)  BMI 20.55 kg/m2  SpO2 98%  Physical Exam  Vitals reviewed. Constitutional: She is oriented to person, place, and time and well-developed, well-nourished, and in no distress.  HENT:  Head: Normocephalic and atraumatic.  Right Ear: External ear normal.  Left Ear: External ear normal.  Nose: Nose normal.  Mouth/Throat: Oropharynx is clear and moist. No oropharyngeal exudate.  TM within normal limits bilaterally.  Eyes: Conjunctivae are normal. Pupils are equal, round, and reactive to light.  Neck: Neck supple. No thyromegaly present.  Cardiovascular: Normal rate, regular rhythm, normal heart sounds and intact distal pulses.   Pulmonary/Chest: Effort normal and breath sounds normal. No respiratory distress. She has no wheezes. She has no rales. She exhibits no tenderness.  Abdominal: Soft. Bowel sounds are normal. She exhibits no distension and no mass. There is no tenderness. There is no rebound and no guarding.  Lymphadenopathy:    She has no cervical adenopathy.  Neurological: She is alert and oriented to person, place, and time.  Skin: Skin is warm and dry. No rash noted.  Psychiatric: Affect normal.    Recent Results (from the past 2160 hour(s))  CBC WITH DIFFERENTIAL     Status:  None   Collection Time    12/10/13  8:55 AM      Result Value Ref Range   WBC 4.8  4.0 - 10.5 K/uL   RBC 4.40  3.87 - 5.11 MIL/uL   Hemoglobin 13.4  12.0 - 15.0 g/dL   HCT 39.4  36.0 - 46.0 %   MCV 89.5  78.0 - 100.0 fL   MCH 30.5  26.0 - 34.0 pg   MCHC 34.0  30.0 - 36.0 g/dL   RDW 12.8  11.5 - 15.5 %   Platelets 293  150 - 400 K/uL   Neutrophils Relative %  57  43 - 77 %   Neutro Abs 2.7  1.7 - 7.7 K/uL   Lymphocytes Relative 30  12 - 46 %   Lymphs Abs 1.4  0.7 - 4.0 K/uL   Monocytes Relative 8  3 - 12 %   Monocytes Absolute 0.4  0.1 - 1.0 K/uL   Eosinophils Relative 4  0 - 5 %   Eosinophils Absolute 0.2  0.0 - 0.7 K/uL   Basophils Relative 1  0 - 1 %   Basophils Absolute 0.0  0.0 - 0.1 K/uL   Smear Review Criteria for review not met    BASIC METABOLIC PANEL     Status: None   Collection Time    12/10/13  8:55 AM      Result Value Ref Range   Sodium 140  135 - 145 mEq/L   Potassium 4.0  3.5 - 5.3 mEq/L   Chloride 104  96 - 112 mEq/L   CO2 27  19 - 32 mEq/L   Glucose, Bld 89  70 - 99 mg/dL   BUN 17  6 - 23 mg/dL   Creat 0.73  0.50 - 1.10 mg/dL   Calcium 9.0  8.4 - 10.5 mg/dL  TSH     Status: None   Collection Time    12/10/13  8:55 AM      Result Value Ref Range   TSH 1.432  0.350 - 4.500 uIU/mL  HEPATIC FUNCTION PANEL     Status: None   Collection Time    12/10/13  8:55 AM      Result Value Ref Range   Total Bilirubin 0.5  0.2 - 1.2 mg/dL   Bilirubin, Direct 0.1  0.0 - 0.3 mg/dL   Indirect Bilirubin 0.4  0.2 - 1.2 mg/dL   Alkaline Phosphatase 85  39 - 117 U/L   AST 24  0 - 37 U/L   ALT 21  0 - 35 U/L   Total Protein 6.5  6.0 - 8.3 g/dL   Albumin 4.1  3.5 - 5.2 g/dL  HEMOGLOBIN A1C     Status: Abnormal   Collection Time    12/10/13  8:55 AM      Result Value Ref Range   Hemoglobin A1C 6.0 (*) <5.7 %   Comment:                                                                            According to the ADA Clinical Practice Recommendations for 2011, when      HbA1c is used as a screening test:             >=6.5%   Diagnostic of Diabetes Mellitus                (if abnormal result is confirmed)           5.7-6.4%   Increased risk of developing Diabetes Mellitus           References:Diagnosis and Classification of Diabetes Mellitus,Diabetes     ZTIW,5809,98(PJASN 1):S62-S69 and Standards of Medical Care in             Diabetes - 2011,Diabetes Care,2011,34 (Suppl 1):S11-S61.  Mean Plasma Glucose 126 (*) <117 mg/dL  LIPID PANEL     Status: None   Collection Time    12/10/13  8:55 AM      Result Value Ref Range   Cholesterol 153  0 - 200 mg/dL   Comment: ATP III Classification:           < 200        mg/dL        Desirable          200 - 239     mg/dL        Borderline High          >= 240        mg/dL        High         Triglycerides 108  <150 mg/dL   HDL 55  >39 mg/dL   Total CHOL/HDL Ratio 2.8     VLDL 22  0 - 40 mg/dL   LDL Cholesterol 76  0 - 99 mg/dL   Comment:       Total Cholesterol/HDL Ratio:CHD Risk                            Coronary Heart Disease Risk Table                                            Men       Women              1/2 Average Risk              3.4        3.3                  Average Risk              5.0        4.4               2X Average Risk              9.6        7.1               3X Average Risk             23.4       11.0     Use the calculated Patient Ratio above and the CHD Risk table      to determine the patient's CHD Risk.     ATP III Classification (LDL):           < 100        mg/dL         Optimal          100 - 129     mg/dL         Near or Above Optimal          130 - 159     mg/dL         Borderline High          160 - 189     mg/dL         High           > 190        mg/dL  Very High        URINALYSIS, MICROSCOPIC ONLY     Status: None   Collection Time    12/10/13  8:55 AM      Result Value Ref Range   Squamous Epithelial / LPF NONE SEEN  RARE   Crystals NONE SEEN   NONE SEEN   Casts NONE SEEN  NONE SEEN   WBC, UA 0-2  <3 WBC/hpf   RBC / HPF 0-2  <3 RBC/hpf   Bacteria, UA NONE SEEN  RARE  VITAMIN D 1,25 DIHYDROXY     Status: None   Collection Time    12/10/13  8:55 AM      Result Value Ref Range   Vitamin D 1, 25 (OH)2 Total 60  18 - 72 pg/mL   Vitamin D3 1, 25 (OH)2 60     Vitamin D2 1, 25 (OH)2 <8     Comment: Vitamin D3, 1,25(OH)2 indicates both endogenous     production and supplementation.  Vitamin D2, 1,25(OH)2     is an indicator of exogeous sources, such as diet or     supplementation.  Interpretation and therapy are based     on measurement of Vitamin D,1,25(OH)2, Total.     This test was developed and its performance     characteristics have been determined by [facility], [place], [place].     Performance characteristics refer to the     analytical performance of the test.   Assessment/Plan: Screening for ischemic heart disease EKG reveals nonspecific t-wave abnormalities. Referral to Cardiology placed for further assessment.  Continue other medications as directed.  Visit for preventive health examination Labs reviewed.  Health Maintenance up-to-date.

## 2013-12-13 NOTE — Progress Notes (Signed)
Pre visit review using our clinic review tool, if applicable. No additional management support is needed unless otherwise documented below in the visit note/SLS  

## 2013-12-13 NOTE — Patient Instructions (Addendum)
Please continue medications as directed.  Follow-up in 6 months for repeat thyroid testing.  Return sooner as needed.  Preventive Care for Adults A healthy lifestyle and preventive care can promote health and wellness. Preventive health guidelines for women include the following key practices.  A routine yearly physical is a good way to check with your health care provider about your health and preventive screening. It is a chance to share any concerns and updates on your health and to receive a thorough exam.  Visit your dentist for a routine exam and preventive care every 6 months. Brush your teeth twice a day and floss once a day. Good oral hygiene prevents tooth decay and gum disease.  The frequency of eye exams is based on your age, health, family medical history, use of contact lenses, and other factors. Follow your health care provider's recommendations for frequency of eye exams.  Eat a healthy diet. Foods like vegetables, fruits, whole grains, low-fat dairy products, and lean protein foods contain the nutrients you need without too many calories. Decrease your intake of foods high in solid fats, added sugars, and salt. Eat the right amount of calories for you.Get information about a proper diet from your health care provider, if necessary.  Regular physical exercise is one of the most important things you can do for your health. Most adults should get at least 150 minutes of moderate-intensity exercise (any activity that increases your heart rate and causes you to sweat) each week. In addition, most adults need muscle-strengthening exercises on 2 or more days a week.  Maintain a healthy weight. The body mass index (BMI) is a screening tool to identify possible weight problems. It provides an estimate of body fat based on height and weight. Your health care provider can find your BMI, and can help you achieve or maintain a healthy weight.For adults 20 years and older:  A BMI below 18.5 is  considered underweight.  A BMI of 18.5 to 24.9 is normal.  A BMI of 25 to 29.9 is considered overweight.  A BMI of 30 and above is considered obese.  Maintain normal blood lipids and cholesterol levels by exercising and minimizing your intake of saturated fat. Eat a balanced diet with plenty of fruit and vegetables. Blood tests for lipids and cholesterol should begin at age 50 and be repeated every 5 years. If your lipid or cholesterol levels are high, you are over 50, or you are at high risk for heart disease, you may need your cholesterol levels checked more frequently.Ongoing high lipid and cholesterol levels should be treated with medicines if diet and exercise are not working.  If you smoke, find out from your health care provider how to quit. If you do not use tobacco, do not start.  Lung cancer screening is recommended for adults aged 9-80 years who are at high risk for developing lung cancer because of a history of smoking. A yearly low-dose CT scan of the lungs is recommended for people who have at least a 30-pack-year history of smoking and are a current smoker or have quit within the past 15 years. A pack year of smoking is smoking an average of 1 pack of cigarettes a day for 1 year (for example: 1 pack a day for 30 years or 2 packs a day for 15 years). Yearly screening should continue until the smoker has stopped smoking for at least 15 years. Yearly screening should be stopped for people who develop a health problem that would  prevent them from having lung cancer treatment.  If you are pregnant, do not drink alcohol. If you are breastfeeding, be very cautious about drinking alcohol. If you are not pregnant and choose to drink alcohol, do not have more than 1 drink per day. One drink is considered to be 12 ounces (355 mL) of beer, 5 ounces (148 mL) of wine, or 1.5 ounces (44 mL) of liquor.  Avoid use of street drugs. Do not share needles with anyone. Ask for help if you need support or  instructions about stopping the use of drugs.  High blood pressure causes heart disease and increases the risk of stroke. Your blood pressure should be checked at least every 1 to 2 years. Ongoing high blood pressure should be treated with medicines if weight loss and exercise do not work.  If you are 68-64 years old, ask your health care provider if you should take aspirin to prevent strokes.  Diabetes screening involves taking a blood sample to check your fasting blood sugar level. This should be done once every 3 years, after age 22, if you are within normal weight and without risk factors for diabetes. Testing should be considered at a younger age or be carried out more frequently if you are overweight and have at least 1 risk factor for diabetes.  Breast cancer screening is essential preventive care for women. You should practice "breast self-awareness." This means understanding the normal appearance and feel of your breasts and may include breast self-examination. Any changes detected, no matter how small, should be reported to a health care provider. Women in their 17s and 30s should have a clinical breast exam (CBE) by a health care provider as part of a regular health exam every 1 to 3 years. After age 37, women should have a CBE every year. Starting at age 38, women should consider having a mammogram (breast X-ray test) every year. Women who have a family history of breast cancer should talk to their health care provider about genetic screening. Women at a high risk of breast cancer should talk to their health care providers about having an MRI and a mammogram every year.  Breast cancer gene (BRCA)-related cancer risk assessment is recommended for women who have family members with BRCA-related cancers. BRCA-related cancers include breast, ovarian, tubal, and peritoneal cancers. Having family members with these cancers may be associated with an increased risk for harmful changes (mutations) in  the breast cancer genes BRCA1 and BRCA2. Results of the assessment will determine the need for genetic counseling and BRCA1 and BRCA2 testing.  Routine pelvic exams to screen for cancer are no longer recommended for nonpregnant women who are considered low risk for cancer of the pelvic organs (ovaries, uterus, and vagina) and who do not have symptoms. Ask your health care provider if a screening pelvic exam is right for you.  If you have had past treatment for cervical cancer or a condition that could lead to cancer, you need Pap tests and screening for cancer for at least 20 years after your treatment. If Pap tests have been discontinued, your risk factors (such as having a new sexual partner) need to be reassessed to determine if screening should be resumed. Some women have medical problems that increase the chance of getting cervical cancer. In these cases, your health care provider may recommend more frequent screening and Pap tests.  The HPV test is an additional test that may be used for cervical cancer screening. The HPV test looks for  the virus that can cause the cell changes on the cervix. The cells collected during the Pap test can be tested for HPV. The HPV test could be used to screen women aged 48 years and older, and should be used in women of any age who have unclear Pap test results. After the age of 15, women should have HPV testing at the same frequency as a Pap test.  Colorectal cancer can be detected and often prevented. Most routine colorectal cancer screening begins at the age of 63 years and continues through age 33 years. However, your health care provider may recommend screening at an earlier age if you have risk factors for colon cancer. On a yearly basis, your health care provider may provide home test kits to check for hidden blood in the stool. Use of a small camera at the end of a tube, to directly examine the colon (sigmoidoscopy or colonoscopy), can detect the earliest forms  of colorectal cancer. Talk to your health care provider about this at age 31, when routine screening begins. Direct exam of the colon should be repeated every 5-10 years through age 60 years, unless early forms of pre-cancerous polyps or small growths are found.  People who are at an increased risk for hepatitis B should be screened for this virus. You are considered at high risk for hepatitis B if:  You were born in a country where hepatitis B occurs often. Talk with your health care provider about which countries are considered high risk.  Your parents were born in a high-risk country and you have not received a shot to protect against hepatitis B (hepatitis B vaccine).  You have HIV or AIDS.  You use needles to inject street drugs.  You live with, or have sex with, someone who has Hepatitis B.  You get hemodialysis treatment.  You take certain medicines for conditions like cancer, organ transplantation, and autoimmune conditions.  Hepatitis C blood testing is recommended for all people born from [birth year] through 1965 and any individual with known risks for hepatitis C.  Practice safe sex. Use condoms and avoid high-risk sexual practices to reduce the spread of sexually transmitted infections (STIs). STIs include gonorrhea, chlamydia, syphilis, trichomonas, herpes, HPV, and human immunodeficiency virus (HIV). Herpes, HIV, and HPV are viral illnesses that have no cure. They can result in disability, cancer, and death.  You should be screened for sexually transmitted illnesses (STIs) including gonorrhea and chlamydia if:  You are sexually active and are younger than 24 years.  You are older than 24 years and your health care provider tells you that you are at risk for this type of infection.  Your sexual activity has changed since you were last screened and you are at an increased risk for chlamydia or gonorrhea. Ask your health care provider if you are at risk.  If you are at risk of  being infected with HIV, it is recommended that you take a prescription medicine daily to prevent HIV infection. This is called preexposure prophylaxis (PrEP). You are considered at risk if:  You are a heterosexual woman, are sexually active, and are at increased risk for HIV infection.  You take drugs by injection.  You are sexually active with a partner who has HIV.  Talk with your health care provider about whether you are at high risk of being infected with HIV. If you choose to begin PrEP, you should first be tested for HIV. You should then be tested every 3 months for  as long as you are taking PrEP.  Osteoporosis is a disease in which the bones lose minerals and strength with aging. This can result in serious bone fractures or breaks. The risk of osteoporosis can be identified using a bone density scan. Women ages 65 years and over and women at risk for fractures or osteoporosis should discuss screening with their health care providers. Ask your health care provider whether you should take a calcium supplement or vitamin D to reduce the rate of osteoporosis.  Menopause can be associated with physical symptoms and risks. Hormone replacement therapy is available to decrease symptoms and risks. You should talk to your health care provider about whether hormone replacement therapy is right for you.  Use sunscreen. Apply sunscreen liberally and repeatedly throughout the day. You should seek shade when your shadow is shorter than you. Protect yourself by wearing long sleeves, pants, a wide-brimmed hat, and sunglasses year round, whenever you are outdoors.  Once a month, do a whole body skin exam, using a mirror to look at the skin on your back. Tell your health care provider of new moles, moles that have irregular borders, moles that are larger than a pencil eraser, or moles that have changed in shape or color.  Stay current with required vaccines (immunizations).  Influenza vaccine. All adults  should be immunized every year.  Tetanus, diphtheria, and acellular pertussis (Td, Tdap) vaccine. Pregnant women should receive 1 dose of Tdap vaccine during each pregnancy. The dose should be obtained regardless of the length of time since the last dose. Immunization is preferred during the 27th-36th week of gestation. An adult who has not previously received Tdap or who does not know her vaccine status should receive 1 dose of Tdap. This initial dose should be followed by tetanus and diphtheria toxoids (Td) booster doses every 10 years. Adults with an unknown or incomplete history of completing a 3-dose immunization series with Td-containing vaccines should begin or complete a primary immunization series including a Tdap dose. Adults should receive a Td booster every 10 years.  Varicella vaccine. An adult without evidence of immunity to varicella should receive 2 doses or a second dose if she has previously received 1 dose. Pregnant females who do not have evidence of immunity should receive the first dose after pregnancy. This first dose should be obtained before leaving the health care facility. The second dose should be obtained 4-8 weeks after the first dose.  Human papillomavirus (HPV) vaccine. Females aged 13-26 years who have not received the vaccine previously should obtain the 3-dose series. The vaccine is not recommended for use in pregnant females. However, pregnancy testing is not needed before receiving a dose. If a female is found to be pregnant after receiving a dose, no treatment is needed. In that case, the remaining doses should be delayed until after the pregnancy. Immunization is recommended for any person with an immunocompromised condition through the age of 5 years if she did not get any or all doses earlier. During the 3-dose series, the second dose should be obtained 4-8 weeks after the first dose. The third dose should be obtained 24 weeks after the first dose and 16 weeks after  the second dose.  Zoster vaccine. One dose is recommended for adults aged 48 years or older unless certain conditions are present.  Measles, mumps, and rubella (MMR) vaccine. Adults born before [birth year] generally are considered immune to measles and mumps. Adults born in [birth year] or later should have 1 or more  doses of MMR vaccine unless there is a contraindication to the vaccine or there is laboratory evidence of immunity to each of the three diseases. A routine second dose of MMR vaccine should be obtained at least 28 days after the first dose for students attending postsecondary schools, health care workers, or international travelers. People who received inactivated measles vaccine or an unknown type of measles vaccine during 1963-1967 should receive 2 doses of MMR vaccine. People who received inactivated mumps vaccine or an unknown type of mumps vaccine before 1979 and are at high risk for mumps infection should consider immunization with 2 doses of MMR vaccine. For females of childbearing age, rubella immunity should be determined. If there is no evidence of immunity, females who are not pregnant should be vaccinated. If there is no evidence of immunity, females who are pregnant should delay immunization until after pregnancy. Unvaccinated health care workers born before [birth year] who lack laboratory evidence of measles, mumps, or rubella immunity or laboratory confirmation of disease should consider measles and mumps immunization with 2 doses of MMR vaccine or rubella immunization with 1 dose of MMR vaccine.  Pneumococcal 13-valent conjugate (PCV13) vaccine. When indicated, a person who is uncertain of her immunization history and has no record of immunization should receive the PCV13 vaccine. An adult aged 55 years or older who has certain medical conditions and has not been previously immunized should receive 1 dose of PCV13 vaccine. This PCV13 should be followed with a dose of pneumococcal polysaccharide (PPSV23)  vaccine. The PPSV23 vaccine dose should be obtained at least 8 weeks after the dose of PCV13 vaccine. An adult aged 47 years or older who has certain medical conditions and previously received 1 or more doses of PPSV23 vaccine should receive 1 dose of PCV13. The PCV13 vaccine dose should be obtained 1 or more years after the last PPSV23 vaccine dose.  Pneumococcal polysaccharide (PPSV23) vaccine. When PCV13 is also indicated, PCV13 should be obtained first. All adults aged 7 years and older should be immunized. An adult younger than age 26 years who has certain medical conditions should be immunized. Any person who resides in a nursing home or long-term care facility should be immunized. An adult smoker should be immunized. People with an immunocompromised condition and certain other conditions should receive both PCV13 and PPSV23 vaccines. People with human immunodeficiency virus (HIV) infection should be immunized as soon as possible after diagnosis. Immunization during chemotherapy or radiation therapy should be avoided. Routine use of PPSV23 vaccine is not recommended for American Indians, [place] Natives, or people younger than 65 years unless there are medical conditions that require PPSV23 vaccine. When indicated, people who have unknown immunization and have no record of immunization should receive PPSV23 vaccine. One-time revaccination 5 years after the first dose of PPSV23 is recommended for people aged 19-64 years who have chronic kidney failure, nephrotic syndrome, asplenia, or immunocompromised conditions. People who received 1-2 doses of PPSV23 before age 21 years should receive another dose of PPSV23 vaccine at age 36 years or later if at least 5 years have passed since the previous dose. Doses of PPSV23 are not needed for people immunized with PPSV23 at or after age 9 years.  Meningococcal vaccine. Adults with asplenia or persistent complement component deficiencies should receive 2 doses of  quadrivalent meningococcal conjugate (MenACWY-D) vaccine. The doses should be obtained at least 2 months apart. Microbiologists working with certain meningococcal bacteria, [place] recruits, people at risk during an outbreak, and people who travel to  or live in countries with a high rate of meningitis should be immunized. A first-year college student up through age 91 years who is living in a residence hall should receive a dose if she did not receive a dose on or after her 16th birthday. Adults who have certain high-risk conditions should receive one or more doses of vaccine.  Hepatitis A vaccine. Adults who wish to be protected from this disease, have certain high-risk conditions, work with hepatitis A-infected animals, work in hepatitis A research labs, or travel to or work in countries with a high rate of hepatitis A should be immunized. Adults who were previously unvaccinated and who anticipate close contact with an international adoptee during the first 60 days after arrival in the [place] from a country with a high rate of hepatitis A should be immunized.  Hepatitis B vaccine. Adults who wish to be protected from this disease, have certain high-risk conditions, may be exposed to blood or other infectious body fluids, are household contacts or sex partners of hepatitis B positive people, are clients or workers in certain care facilities, or travel to or work in countries with a high rate of hepatitis B should be immunized.  Haemophilus influenzae type b (Hib) vaccine. A previously unvaccinated person with asplenia or sickle cell disease or having a scheduled splenectomy should receive 1 dose of Hib vaccine. Regardless of previous immunization, a recipient of a hematopoietic stem cell transplant should receive a 3-dose series 6-12 months after her successful transplant. Hib vaccine is not recommended for adults with HIV infection. Preventive Services / Frequency Ages 33 to 39years  Blood  pressure check.** / Every 1 to 2 years.  Lipid and cholesterol check.** / Every 5 years beginning at age 12.  Clinical breast exam.** / Every 3 years for women in their 25s and 70s.  BRCA-related cancer risk assessment.** / For women who have family members with a BRCA-related cancer (breast, ovarian, tubal, or peritoneal cancers).  Pap test.** / Every 2 years from ages 72 through 68. Every 3 years starting at age 27 through age 47 or 80 with a history of 3 consecutive normal Pap tests.  HPV screening.** / Every 3 years from ages 20 through ages 59 to 11 with a history of 3 consecutive normal Pap tests.  Hepatitis C blood test.** / For any individual with known risks for hepatitis C.  Skin self-exam. / Monthly.  Influenza vaccine. / Every year.  Tetanus, diphtheria, and acellular pertussis (Tdap, Td) vaccine.** / Consult your health care provider. Pregnant women should receive 1 dose of Tdap vaccine during each pregnancy. 1 dose of Td every 10 years.  Varicella vaccine.** / Consult your health care provider. Pregnant females who do not have evidence of immunity should receive the first dose after pregnancy.  HPV vaccine. / 3 doses over 6 months, if 1 and younger. The vaccine is not recommended for use in pregnant females. However, pregnancy testing is not needed before receiving a dose.  Measles, mumps, rubella (MMR) vaccine.** / You need at least 1 dose of MMR if you were born in 1957 or later. You may also need a 2nd dose. For females of childbearing age, rubella immunity should be determined. If there is no evidence of immunity, females who are not pregnant should be vaccinated. If there is no evidence of immunity, females who are pregnant should delay immunization until after pregnancy.  Pneumococcal 13-valent conjugate (PCV13) vaccine.** / Consult your health care provider.  Pneumococcal polysaccharide (PPSV23)  vaccine.** / 1 to 2 doses if you smoke cigarettes or if you have certain  conditions.  Meningococcal vaccine.** / 1 dose if you are age 70 to 13 years and a Market researcher living in a residence hall, or have one of several medical conditions, you need to get vaccinated against meningococcal disease. You may also need additional booster doses.  Hepatitis A vaccine.** / Consult your health care provider.  Hepatitis B vaccine.** / Consult your health care provider.  Haemophilus influenzae type b (Hib) vaccine.** / Consult your health care provider. Ages 79 to 64years  Blood pressure check.** / Every 1 to 2 years.  Lipid and cholesterol check.** / Every 5 years beginning at age 45 years.  Lung cancer screening. / Every year if you are aged 34-80 years and have a 30-pack-year history of smoking and currently smoke or have quit within the past 15 years. Yearly screening is stopped once you have quit smoking for at least 15 years or develop a health problem that would prevent you from having lung cancer treatment.  Clinical breast exam.** / Every year after age 7 years.  BRCA-related cancer risk assessment.** / For women who have family members with a BRCA-related cancer (breast, ovarian, tubal, or peritoneal cancers).  Mammogram.** / Every year beginning at age 107 years and continuing for as long as you are in good health. Consult with your health care provider.  Pap test.** / Every 3 years starting at age 90 years through age 12 or 40 years with a history of 3 consecutive normal Pap tests.  HPV screening.** / Every 3 years from ages 70 years through ages 28 to 7 years with a history of 3 consecutive normal Pap tests.  Fecal occult blood test (FOBT) of stool. / Every year beginning at age 48 years and continuing until age 67 years. You may not need to do this test if you get a colonoscopy every 10 years.  Flexible sigmoidoscopy or colonoscopy.** / Every 5 years for a flexible sigmoidoscopy or every 10 years for a colonoscopy beginning at age 81 years  and continuing until age 31 years.  Hepatitis C blood test.** / For all people born from [birth year] through 1965 and any individual with known risks for hepatitis C.  Skin self-exam. / Monthly.  Influenza vaccine. / Every year.  Tetanus, diphtheria, and acellular pertussis (Tdap/Td) vaccine.** / Consult your health care provider. Pregnant women should receive 1 dose of Tdap vaccine during each pregnancy. 1 dose of Td every 10 years.  Varicella vaccine.** / Consult your health care provider. Pregnant females who do not have evidence of immunity should receive the first dose after pregnancy.  Zoster vaccine.** / 1 dose for adults aged 51 years or older.  Measles, mumps, rubella (MMR) vaccine.** / You need at least 1 dose of MMR if you were born in 1957 or later. You may also need a 2nd dose. For females of childbearing age, rubella immunity should be determined. If there is no evidence of immunity, females who are not pregnant should be vaccinated. If there is no evidence of immunity, females who are pregnant should delay immunization until after pregnancy.  Pneumococcal 13-valent conjugate (PCV13) vaccine.** / Consult your health care provider.  Pneumococcal polysaccharide (PPSV23) vaccine.** / 1 to 2 doses if you smoke cigarettes or if you have certain conditions.  Meningococcal vaccine.** / Consult your health care provider.  Hepatitis A vaccine.** / Consult your health care provider.  Hepatitis B vaccine.** / Consult  your health care provider.  Haemophilus influenzae type b (Hib) vaccine.** / Consult your health care provider. Ages 77 years and over  Blood pressure check.** / Every 1 to 2 years.  Lipid and cholesterol check.** / Every 5 years beginning at age 65 years.  Lung cancer screening. / Every year if you are aged 10-80 years and have a 30-pack-year history of smoking and currently smoke or have quit within the past 15 years. Yearly screening is stopped once you have quit smoking  for at least 15 years or develop a health problem that would prevent you from having lung cancer treatment.  Clinical breast exam.** / Every year after age 56 years.  BRCA-related cancer risk assessment.** / For women who have family members with a BRCA-related cancer (breast, ovarian, tubal, or peritoneal cancers).  Mammogram.** / Every year beginning at age 42 years and continuing for as long as you are in good health. Consult with your health care provider.  Pap test.** / Every 3 years starting at age 8 years through age 45 or 45 years with 3 consecutive normal Pap tests. Testing can be stopped between 65 and 70 years with 3 consecutive normal Pap tests and no abnormal Pap or HPV tests in the past 10 years.  HPV screening.** / Every 3 years from ages 8 years through ages 32 or 42 years with a history of 3 consecutive normal Pap tests. Testing can be stopped between 65 and 70 years with 3 consecutive normal Pap tests and no abnormal Pap or HPV tests in the past 10 years.  Fecal occult blood test (FOBT) of stool. / Every year beginning at age 22 years and continuing until age 73 years. You may not need to do this test if you get a colonoscopy every 10 years.  Flexible sigmoidoscopy or colonoscopy.** / Every 5 years for a flexible sigmoidoscopy or every 10 years for a colonoscopy beginning at age 60 years and continuing until age 58 years.  Hepatitis C blood test.** / For all people born from [birth year] through 1965 and any individual with known risks for hepatitis C.  Osteoporosis screening.** / A one-time screening for women ages 78 years and over and women at risk for fractures or osteoporosis.  Skin self-exam. / Monthly.  Influenza vaccine. / Every year.  Tetanus, diphtheria, and acellular pertussis (Tdap/Td) vaccine.** / 1 dose of Td every 10 years.  Varicella vaccine.** / Consult your health care provider.  Zoster vaccine.** / 1 dose for adults aged 65 years or older.  Pneumococcal  13-valent conjugate (PCV13) vaccine.** / Consult your health care provider.  Pneumococcal polysaccharide (PPSV23) vaccine.** / 1 dose for all adults aged 29 years and older.  Meningococcal vaccine.** / Consult your health care provider.  Hepatitis A vaccine.** / Consult your health care provider.  Hepatitis B vaccine.** / Consult your health care provider.  Haemophilus influenzae type b (Hib) vaccine.** / Consult your health care provider. ** Family history and personal history of risk and conditions may change your health care provider's recommendations. Document Released: 07/27/2001 Document Revised: 06/05/2013 Document Reviewed: 10/26/2010 [facility] Patient Information 2015 [place], [place]. This information is not intended to replace advice given to you by your health care provider. Make sure you discuss any questions you have with your health care provider.

## 2013-12-14 LAB — VITAMIN D 1,25 DIHYDROXY
VITAMIN D 1, 25 (OH) TOTAL: 60 pg/mL (ref 18–72)
Vitamin D3 1, 25 (OH)2: 60 pg/mL

## 2013-12-23 DIAGNOSIS — Z136 Encounter for screening for cardiovascular disorders: Secondary | ICD-10-CM | POA: Insufficient documentation

## 2013-12-23 DIAGNOSIS — Z0000 Encounter for general adult medical examination without abnormal findings: Secondary | ICD-10-CM | POA: Insufficient documentation

## 2013-12-23 NOTE — Assessment & Plan Note (Signed)
EKG reveals nonspecific t-wave abnormalities. Referral to Cardiology placed for further assessment.  Continue other medications as directed.

## 2013-12-23 NOTE — Assessment & Plan Note (Signed)
Labs reviewed.  Health Maintenance up-to-date.

## 2013-12-24 ENCOUNTER — Telehealth: Payer: Self-pay | Admitting: Physician Assistant

## 2013-12-24 MED ORDER — LEVOTHYROXINE SODIUM 75 MCG PO TABS: 75.0000 ug | ORAL_TABLET | Freq: Every day | ORAL | Status: DC

## 2013-12-24 NOTE — Telephone Encounter (Signed)
30 day supply of levothyroxine sent to [facility]. Will call [facility] tomorrow to check on status of other Rxs.

## 2013-12-24 NOTE — Telephone Encounter (Signed)
[facility] on 12/13/13. [facility] never received refills. Also, patient states that she will now run out of levothyroxine and needs an emergency supply sent to [facility]

## 2013-12-25 NOTE — Telephone Encounter (Signed)
Notified pt. 

## 2013-12-25 NOTE — Telephone Encounter (Signed)
Spoke with pharmacist at [facility] and they state they did not receive Rxs dated 12/13/13 for: atorvastatin, levothyroxine and cyclobenzaprine. Gave verbal to pharmacist per 12/13/13 office note (Ref# [id number]). Left message for pt to return my call.

## 2013-12-25 NOTE — Telephone Encounter (Signed)
Patient left message returning Tricias call, she can be reached at home at [phone]

## 2014-01-15 ENCOUNTER — Other Ambulatory Visit: Payer: Self-pay

## 2014-01-15 DIAGNOSIS — Z1231 Encounter for screening mammogram for malignant neoplasm of breast: Secondary | ICD-10-CM

## 2014-01-30 ENCOUNTER — Encounter: Payer: Self-pay | Admitting: *Deleted

## 2014-01-30 ENCOUNTER — Ambulatory Visit (INDEPENDENT_AMBULATORY_CARE_PROVIDER_SITE_OTHER): Payer: BC Managed Care – PPO | Admitting: Cardiology

## 2014-01-30 ENCOUNTER — Encounter: Payer: Self-pay | Admitting: Cardiology

## 2014-01-30 VITALS — BP 124/58 | HR 81 | Ht 63.0 in | Wt 117.1 lb

## 2014-01-30 DIAGNOSIS — E7800 Pure hypercholesterolemia, unspecified: Secondary | ICD-10-CM

## 2014-01-30 DIAGNOSIS — R079 Chest pain, unspecified: Secondary | ICD-10-CM | POA: Insufficient documentation

## 2014-01-30 DIAGNOSIS — R072 Precordial pain: Secondary | ICD-10-CM

## 2014-01-30 DIAGNOSIS — R9431 Abnormal electrocardiogram [ECG] [EKG]: Secondary | ICD-10-CM

## 2014-01-30 NOTE — Assessment & Plan Note (Signed)
Continue statin. 

## 2014-01-30 NOTE — Patient Instructions (Signed)
Your physician recommends that you schedule a follow-up appointment in: AS NEEDED PENDING TEST RESULTS  Your physician has requested that you have a stress echocardiogram. For further information please visit [email or web address]. Please follow instruction sheet as given.

## 2014-01-30 NOTE — Progress Notes (Signed)
HPI: 63 year old female for evaluation of abnormal electrocardiogram. Patient denies dyspnea on exertion, orthopnea, PND, pedal edema or syncope. She has had occasional chest pain for years. This is typically associated with stressful situations with her daughter. It is substernal without radiation. No associated symptoms. It resolved spontaneously. She is unclear about the duration. She does not have exertional chest pain. Recently noted to have an abnormal electrocardiogram and cardiology asked to evaluate.  Current Outpatient Prescriptions  Medication Sig Dispense Refill  . ALPRAZolam (XANAX) 0.5 MG tablet Take 1 tablet (0.5 mg total) by mouth 3 (three) times daily as needed for anxiety.  90 tablet  0  . aspirin 81 MG tablet Take 81 mg by mouth daily.        Marland Kitchen atorvastatin (LIPITOR) 20 MG tablet Take 0.5 tablets (10 mg total) by mouth daily. Take 1/2 tablet by mouth daily  45 tablet  1  . Cholecalciferol (VITAMIN D) 1000 UNITS capsule Take 1,000 Units by mouth daily.        . Coconut Oil 1000 MG CAPS Take 1 each by mouth daily.      . Coenzyme Q10 (COQ10) 100 MG CAPS Take 1 capsule by mouth daily.        . cyclobenzaprine (FLEXERIL) 10 MG tablet Take 1 tablet (10 mg total) by mouth daily.  90 tablet  1  . estradiol (ESTRACE) 1 MG tablet Take 0.5 mg by mouth daily.       . famciclovir (FAMVIR) 500 MG tablet Take 500 mg by mouth 3 (three) times daily. X 7 days-Maintenance Medication      . levothyroxine (SYNTHROID, LEVOTHROID) 75 MCG tablet Take 1 tablet (75 mcg total) by mouth daily.  30 tablet  0  . NON FORMULARY Take 3 each by mouth daily. Bone-Up: Calcium Supplement      . vitamin C (ASCORBIC ACID) 500 MG tablet Take 1,000 mg by mouth daily.       . vitamin E 400 UNIT capsule Take 400 Units by mouth daily.         No current facility-administered medications for this visit.    Allergies  Allergen Reactions  . Iodine   . Sulfonamide Derivatives     REACTION: throat swelling and  itching  . Valtrex [Valacyclovir Hcl]     "made me deathly sick"    Past Medical History  Diagnosis Date  . Acute asthmatic bronchitis   . Hypercholesterolemia   . Hypothyroidism   . Interstitial cystitis   . Fibromyalgia   . Anxiety     Past Surgical History  Procedure Laterality Date  . Total abdominal hysterectomy  1997    History   Social History  . Marital Status: Divorced    Spouse Name: N/A    Number of Children: 3  . Years of Education: N/A   Occupational History  . school teacher - substitute teaching now    Social History Main Topics  . Smoking status: Never Smoker   . Smokeless tobacco: Never Used  . Alcohol Use: Yes     Comment: Rare  . Drug Use: No  . Sexual Activity: Not on file   Other Topics Concern  . Not on file   Social History Narrative   Plays piano, 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   Declines flu and pneumonia shot 04-30-10    Family History  Problem Relation Age of Onset  . Alzheimer's disease Father   . Stroke Father   . Heart failure  Father   . Other Mother     bronchiectasis  . Pneumonia Mother   . Heart failure Mother   . Pancreatic cancer Brother   . Hyperlipidemia Sister   . Other Sister     bronchiectasis    ROS: no fevers or chills, productive cough, hemoptysis, dysphasia, odynophagia, melena, hematochezia, dysuria, hematuria, rash, seizure activity, orthopnea, PND, pedal edema, claudication. Remaining systems are negative.  Physical Exam:   Blood pressure 124/58, pulse 81, height 5\' 3"  (1.6 m), weight 117 lb 1.9 oz (53.125 kg).  General:  Well developed/well nourished in NAD Skin warm/dry Patient not depressed No peripheral clubbing Back-normal HEENT-normal/normal eyelids Neck supple/normal carotid upstroke bilaterally; no bruits; no JVD; no thyromegaly chest - CTA/ normal expansion CV - RRR/normal S1 and S2; no murmurs, rubs or gallops;  PMI nondisplaced Abdomen -NT/ND, no HSM, no mass, + bowel sounds, no bruit 2+  femoral pulses, no bruits Ext-no edema, chords, 2+ DP Neuro-grossly nonfocal  ECG Sinus rhythm, inferolateral T wave changes. Cannot R/O prior septal MI.

## 2014-01-30 NOTE — Assessment & Plan Note (Signed)
Symptoms atypical. Schedule stress echocardiogram for risk stratification. 

## 2014-01-30 NOTE — Assessment & Plan Note (Signed)
I have reviewed her previous electrocardiogram from 2010. The patient had inferior lateral T-wave changes at that time. However given chest pain we'll plan stress echocardiogram for risk stratification.

## 2014-02-04 ENCOUNTER — Encounter: Payer: Self-pay | Admitting: Cardiovascular Disease

## 2014-02-12 ENCOUNTER — Ambulatory Visit (HOSPITAL_COMMUNITY): Payer: BC Managed Care – PPO | Attending: Cardiovascular Disease

## 2014-02-12 DIAGNOSIS — R9389 Abnormal findings on diagnostic imaging of other specified body structures: Secondary | ICD-10-CM | POA: Diagnosis not present

## 2014-02-12 DIAGNOSIS — R9431 Abnormal electrocardiogram [ECG] [EKG]: Secondary | ICD-10-CM | POA: Insufficient documentation

## 2014-02-12 DIAGNOSIS — E785 Hyperlipidemia, unspecified: Secondary | ICD-10-CM | POA: Diagnosis not present

## 2014-02-12 DIAGNOSIS — R079 Chest pain, unspecified: Secondary | ICD-10-CM | POA: Diagnosis not present

## 2014-02-12 NOTE — Progress Notes (Signed)
Stress echo performed.

## 2014-02-14 ENCOUNTER — Encounter: Payer: Self-pay | Admitting: Cardiology

## 2014-02-14 NOTE — Telephone Encounter (Signed)
This encounter was created in error - please disregard.

## 2014-02-14 NOTE — Telephone Encounter (Signed)
Pt is calling for her results from her Echo that was recently done. Please call  Thanks

## 2014-02-19 ENCOUNTER — Ambulatory Visit
Admission: RE | Admit: 2014-02-19 | Discharge: 2014-02-19 | Disposition: A | Discharge status: Home / Self Care | Payer: BC Managed Care – PPO | Source: Ambulatory Visit

## 2014-02-19 DIAGNOSIS — Z1231 Encounter for screening mammogram for malignant neoplasm of breast: Secondary | ICD-10-CM

## 2014-02-26 ENCOUNTER — Encounter: Payer: Self-pay | Admitting: Cardiology

## 2014-02-26 ENCOUNTER — Ambulatory Visit (INDEPENDENT_AMBULATORY_CARE_PROVIDER_SITE_OTHER): Payer: BC Managed Care – PPO | Admitting: Cardiology

## 2014-02-26 VITALS — BP 124/72 | HR 82 | Ht 63.0 in | Wt 118.9 lb

## 2014-02-26 DIAGNOSIS — R9430 Abnormal result of cardiovascular function study, unspecified: Secondary | ICD-10-CM | POA: Insufficient documentation

## 2014-02-26 MED ORDER — METOPROLOL SUCCINATE ER 25 MG PO TB24: 12.5000 mg | ORAL_TABLET | Freq: Every day | ORAL | Status: DC

## 2014-02-26 NOTE — Patient Instructions (Signed)
Your physician wants you to follow-up in: [place] will receive a reminder letter in the mail two months in advance. If you don't receive a letter, please call our office to schedule the follow-up appointment.   START METOPROLOL SUCC 25 MG TAKE 1/2 TABLET ONCE DAILY

## 2014-02-26 NOTE — Progress Notes (Signed)
      HPI: FU chest pain; Recently seen for atypical CP; stress echo 9/15 showed baseline EF 45; inferior, septal and apical HK both at rest and with stress. Since last seen, She has some fatigue but no dyspnea or exertional chest pain.   Current Outpatient Prescriptions  Medication Sig Dispense Refill  . ALPRAZolam (XANAX) 0.5 MG tablet Take 1 tablet (0.5 mg total) by mouth 3 (three) times daily as needed for anxiety.  90 tablet  0  . aspirin 81 MG tablet Take 81 mg by mouth daily.        Marland Kitchen atorvastatin (LIPITOR) 20 MG tablet Take 0.5 tablets (10 mg total) by mouth daily. Take 1/2 tablet by mouth daily  45 tablet  1  . Cholecalciferol (VITAMIN D) 1000 UNITS capsule Take 1,000 Units by mouth daily.        . Coconut Oil 1000 MG CAPS Take 1 each by mouth daily.      . Coenzyme Q10 (COQ10) 100 MG CAPS Take 1 capsule by mouth daily.        . cyclobenzaprine (FLEXERIL) 10 MG tablet Take 1 tablet (10 mg total) by mouth daily.  90 tablet  1  . estradiol (ESTRACE) 1 MG tablet Take 0.5 mg by mouth daily.       Marland Kitchen levothyroxine (SYNTHROID, LEVOTHROID) 75 MCG tablet Take 1 tablet (75 mcg total) by mouth daily.  30 tablet  0  . NON FORMULARY Take 3 each by mouth daily. Bone-Up: Calcium Supplement      . vitamin C (ASCORBIC ACID) 500 MG tablet Take 1,000 mg by mouth daily.       . vitamin E 400 UNIT capsule Take 400 Units by mouth daily.         No current facility-administered medications for this visit.     Past Medical History  Diagnosis Date  . Acute asthmatic bronchitis   . Hypercholesterolemia   . Hypothyroidism   . Interstitial cystitis   . Fibromyalgia   . Anxiety     Past Surgical History  Procedure Laterality Date  . Total abdominal hysterectomy  1997    History   Social History  . Marital Status: Divorced    Spouse Name: N/A    Number of Children: 3  . Years of Education: N/A   Occupational History  . school teacher - substitute teaching now    Social History Main  Topics  . Smoking status: Never Smoker   . Smokeless tobacco: Never Used  . Alcohol Use: Yes     Comment: Rare  . Drug Use: No  . Sexual Activity: Not on file   Other Topics Concern  . Not on file   Social History Narrative   Plays piano, 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   Declines flu and pneumonia shot 04-30-10    ROS: fatigue but no fevers or chills, productive cough, hemoptysis, dysphasia, odynophagia, melena, hematochezia, dysuria, hematuria, rash, seizure activity, orthopnea, PND, pedal edema, claudication. Remaining systems are negative.  Physical Exam: Well-developed well-nourished in no acute distress.  Skin is warm and dry.  HEENT is normal.  Neck is supple.  Chest is clear to auscultation with normal expansion.  Cardiovascular exam is regular rate and rhythm.  Abdominal exam nontender or distended. No masses palpated. Extremities show no edema. neuro grossly intact

## 2014-02-26 NOTE — Assessment & Plan Note (Signed)
Continue statin. 

## 2014-02-26 NOTE — Assessment & Plan Note (Signed)
Long discussion today concerning results of stress echocardiogram. Ejection fraction 45% both with rest and stress. There appears to be wall motion abnormalities that are unchanged with exertion. We discussed cardiac catheterization versus continued medical therapy. She would prefer the latter. Continue aspirin and statin. Add Toprol 12.5 mg daily. I discussed the symptoms of exertional dyspnea and chest pain. She will contact us if she would agree to catheterization in the future or if her symptoms change. We could proceed with cardiac catheterization at that point.

## 2014-03-04 ENCOUNTER — Telehealth: Payer: Self-pay | Admitting: Cardiology

## 2014-03-04 DIAGNOSIS — R9430 Abnormal result of cardiovascular function study, unspecified: Secondary | ICD-10-CM

## 2014-03-04 MED ORDER — PREDNISONE 20 MG PO TABS: ORAL_TABLET | ORAL | Status: DC

## 2014-03-04 MED ORDER — FAMOTIDINE 20 MG PO TABS: ORAL_TABLET | ORAL | Status: DC

## 2014-03-04 NOTE — Addendum Note (Signed)
Addended by: Cristopher Estimable on: 03/04/2014 04:54 PM   Modules accepted: Orders

## 2014-03-04 NOTE — Telephone Encounter (Signed)
Pt called in stating that Dr. Stanford Breed was going to talk to her about having a heart cath and she would like to speak the nurse about that if possible. Please call  Thanks

## 2014-03-04 NOTE — Telephone Encounter (Signed)
Spoke with pt, she has decided to have the cath. She is scheduled 03-13-14 @ 11am with dr cooper. Instructions discussed over the phone. She has an iodine allergy, therefore; she will take prednisone 60 mg and pepcid 20 mg the night before and the morning of the cath. She is unable to take benadryl. She will go to the [place] office for lab work Patient voiced understanding of instructions

## 2014-03-04 NOTE — Telephone Encounter (Signed)
Message sent to Dr.Crenshaw's nurse Fredia Beets RN.

## 2014-03-05 ENCOUNTER — Other Ambulatory Visit: Payer: Self-pay | Admitting: Cardiology

## 2014-03-05 DIAGNOSIS — R0602 Shortness of breath: Secondary | ICD-10-CM

## 2014-03-11 ENCOUNTER — Encounter (HOSPITAL_COMMUNITY): Payer: Self-pay

## 2014-03-11 LAB — PROTIME-INR
INR: 1.03 (ref <1.50)
PROTHROMBIN TIME: 13.5 s (ref 11.6–15.2)

## 2014-03-12 LAB — BASIC METABOLIC PANEL WITH GFR
BUN: 15 mg/dL (ref 6–23)
CALCIUM: 9.4 mg/dL (ref 8.4–10.5)
CO2: 31 mEq/L (ref 19–32)
CREATININE: 0.83 mg/dL (ref 0.50–1.10)
Chloride: 104 mEq/L (ref 96–112)
GFR, EST AFRICAN AMERICAN: 87 mL/min
GFR, EST NON AFRICAN AMERICAN: 75 mL/min
GLUCOSE: 91 mg/dL (ref 70–99)
Potassium: 4.4 mEq/L (ref 3.5–5.3)
Sodium: 141 mEq/L (ref 135–145)

## 2014-03-12 LAB — CBC
HCT: 38.7 % (ref 36.0–46.0)
Hemoglobin: 12.8 g/dL (ref 12.0–15.0)
MCH: 30.1 pg (ref 26.0–34.0)
MCHC: 33.1 g/dL (ref 30.0–36.0)
MCV: 91.1 fL (ref 78.0–100.0)
PLATELETS: 320 10*3/uL (ref 150–400)
RBC: 4.25 MIL/uL (ref 3.87–5.11)
RDW: 12.7 % (ref 11.5–15.5)
WBC: 5.2 10*3/uL (ref 4.0–10.5)

## 2014-03-13 ENCOUNTER — Ambulatory Visit (HOSPITAL_COMMUNITY)
Admission: RE | Admit: 2014-03-13 | Discharge: 2014-03-13 | Disposition: A | Discharge status: Home / Self Care | Payer: BC Managed Care – PPO | Source: Ambulatory Visit | Attending: Cardiovascular Disease | Admitting: Cardiovascular Disease

## 2014-03-13 ENCOUNTER — Encounter (HOSPITAL_COMMUNITY)
Admission: RE | Disposition: A | Discharge status: Home / Self Care | Payer: Self-pay | Source: Ambulatory Visit | Attending: Cardiovascular Disease

## 2014-03-13 DIAGNOSIS — E7800 Pure hypercholesterolemia, unspecified: Secondary | ICD-10-CM | POA: Insufficient documentation

## 2014-03-13 DIAGNOSIS — R9430 Abnormal result of cardiovascular function study, unspecified: Secondary | ICD-10-CM

## 2014-03-13 DIAGNOSIS — E039 Hypothyroidism, unspecified: Secondary | ICD-10-CM | POA: Diagnosis not present

## 2014-03-13 DIAGNOSIS — Z7982 Long term (current) use of aspirin: Secondary | ICD-10-CM | POA: Diagnosis not present

## 2014-03-13 DIAGNOSIS — R9439 Abnormal result of other cardiovascular function study: Secondary | ICD-10-CM | POA: Insufficient documentation

## 2014-03-13 DIAGNOSIS — R0602 Shortness of breath: Secondary | ICD-10-CM | POA: Diagnosis not present

## 2014-03-13 DIAGNOSIS — Z79899 Other long term (current) drug therapy: Secondary | ICD-10-CM | POA: Insufficient documentation

## 2014-03-13 HISTORY — PX: LEFT HEART CATHETERIZATION WITH CORONARY ANGIOGRAM: SHX5451

## 2014-03-13 SURGERY — LEFT HEART CATHETERIZATION WITH CORONARY ANGIOGRAM
Anesthesia: LOCAL | Laterality: Bilateral

## 2014-03-13 MED ORDER — NITROGLYCERIN 1 MG/10 ML FOR IR/CATH LAB
INTRA_ARTERIAL | Status: AC
  Filled 2014-03-13: qty 10

## 2014-03-13 MED ORDER — SODIUM CHLORIDE 0.9 % IJ SOLN: 3.0000 mL | INTRAMUSCULAR | Status: DC | PRN

## 2014-03-13 MED ORDER — ASPIRIN 81 MG PO CHEW
81.0000 mg | CHEWABLE_TABLET | ORAL | Status: AC
  Administered 2014-03-13: 81 mg via ORAL

## 2014-03-13 MED ORDER — VERAPAMIL HCL 2.5 MG/ML IV SOLN
INTRAVENOUS | Status: AC
  Filled 2014-03-13: qty 2

## 2014-03-13 MED ORDER — PREDNISONE 20 MG PO TABS: 60.0000 mg | ORAL_TABLET | ORAL | Status: DC

## 2014-03-13 MED ORDER — LIDOCAINE HCL (PF) 1 % IJ SOLN
INTRAMUSCULAR | Status: AC
  Filled 2014-03-13: qty 30

## 2014-03-13 MED ORDER — FAMOTIDINE 20 MG PO TABS
20.0000 mg | ORAL_TABLET | ORAL | Status: AC
  Administered 2014-03-13: 20 mg via ORAL

## 2014-03-13 MED ORDER — HEPARIN (PORCINE) IN NACL 2-0.9 UNIT/ML-% IJ SOLN
INTRAMUSCULAR | Status: AC
  Filled 2014-03-13: qty 1000

## 2014-03-13 MED ORDER — SODIUM CHLORIDE 0.9 % IV SOLN
1.0000 mL/kg/h | INTRAVENOUS | Status: DC
  Administered 2014-03-13: 1 mL/kg/h via INTRAVENOUS

## 2014-03-13 MED ORDER — SODIUM CHLORIDE 0.9 % IV SOLN: 1.0000 mL/kg/h | INTRAVENOUS | Status: DC

## 2014-03-13 MED ORDER — FENTANYL CITRATE 0.05 MG/ML IJ SOLN
INTRAMUSCULAR | Status: AC
  Filled 2014-03-13: qty 2

## 2014-03-13 MED ORDER — ONDANSETRON HCL 4 MG/2ML IJ SOLN: 4.0000 mg | Freq: Four times a day (QID) | INTRAMUSCULAR | Status: DC | PRN

## 2014-03-13 MED ORDER — ACETAMINOPHEN 325 MG PO TABS
650.0000 mg | ORAL_TABLET | ORAL | Status: DC | PRN
Start: 2014-03-13 — End: 2014-03-13

## 2014-03-13 MED ORDER — ASPIRIN 81 MG PO CHEW
CHEWABLE_TABLET | ORAL | Status: AC
  Administered 2014-03-13: 81 mg via ORAL
  Filled 2014-03-13: qty 1

## 2014-03-13 MED ORDER — SODIUM CHLORIDE 0.9 % IV SOLN: 250.0000 mL | INTRAVENOUS | Status: DC | PRN

## 2014-03-13 MED ORDER — ONDANSETRON HCL 4 MG/2ML IJ SOLN
INTRAMUSCULAR | Status: AC
  Filled 2014-03-13: qty 2

## 2014-03-13 MED ORDER — MIDAZOLAM HCL 2 MG/2ML IJ SOLN
INTRAMUSCULAR | Status: AC
Start: 2014-03-13 — End: 2014-03-13
  Filled 2014-03-13: qty 2

## 2014-03-13 MED ORDER — MIDAZOLAM HCL 2 MG/2ML IJ SOLN
INTRAMUSCULAR | Status: AC
  Filled 2014-03-13: qty 2

## 2014-03-13 MED ORDER — SODIUM CHLORIDE 0.9 % IJ SOLN: 3.0000 mL | Freq: Two times a day (BID) | INTRAMUSCULAR | Status: DC

## 2014-03-13 MED ORDER — HEPARIN SODIUM (PORCINE) 1000 UNIT/ML IJ SOLN
INTRAMUSCULAR | Status: AC
Start: 2014-03-13 — End: 2014-03-13
  Filled 2014-03-13: qty 1

## 2014-03-13 NOTE — Interval H&P Note (Signed)
History and Physical Interval Note:  03/13/2014 12:08 PM  Jaclyn Bennett  has presented today for surgery, with the diagnosis of abnormal stress  The various methods of treatment have been discussed with the patient and family. After consideration of risks, benefits and other options for treatment, the patient has consented to  Procedure(s): LEFT HEART CATHETERIZATION WITH CORONARY ANGIOGRAM (Bilateral) as a surgical intervention .  The patient's history has been reviewed, patient examined, no change in status, stable for surgery.  I have reviewed the patient's chart and labs.  Questions were answered to the patient's satisfaction.    Cath Lab Visit (complete for each Cath Lab visit)  Clinical Evaluation Leading to the Procedure:   ACS: No.  Non-ACS:    Anginal Classification: CCS II  Anti-ischemic medical therapy: No Therapy  Non-Invasive Test Results: Intermediate-risk stress test findings: cardiac mortality 1-3%/year  Prior CABG: No previous CABG       Sherren Mocha

## 2014-03-13 NOTE — H&P (View-Only) (Signed)
      HPI: FU chest pain; Recently seen for atypical CP; stress echo 9/15 showed baseline EF 45; inferior, septal and apical HK both at rest and with stress. Since last seen, She has some fatigue but no dyspnea or exertional chest pain.   Current Outpatient Prescriptions  Medication Sig Dispense Refill  . ALPRAZolam (XANAX) 0.5 MG tablet Take 1 tablet (0.5 mg total) by mouth 3 (three) times daily as needed for anxiety.  90 tablet  0  . aspirin 81 MG tablet Take 81 mg by mouth daily.        Marland Kitchen atorvastatin (LIPITOR) 20 MG tablet Take 0.5 tablets (10 mg total) by mouth daily. Take 1/2 tablet by mouth daily  45 tablet  1  . Cholecalciferol (VITAMIN D) 1000 UNITS capsule Take 1,000 Units by mouth daily.        . Coconut Oil 1000 MG CAPS Take 1 each by mouth daily.      . Coenzyme Q10 (COQ10) 100 MG CAPS Take 1 capsule by mouth daily.        . cyclobenzaprine (FLEXERIL) 10 MG tablet Take 1 tablet (10 mg total) by mouth daily.  90 tablet  1  . estradiol (ESTRACE) 1 MG tablet Take 0.5 mg by mouth daily.       Marland Kitchen levothyroxine (SYNTHROID, LEVOTHROID) 75 MCG tablet Take 1 tablet (75 mcg total) by mouth daily.  30 tablet  0  . NON FORMULARY Take 3 each by mouth daily. Bone-Up: Calcium Supplement      . vitamin C (ASCORBIC ACID) 500 MG tablet Take 1,000 mg by mouth daily.       . vitamin E 400 UNIT capsule Take 400 Units by mouth daily.         No current facility-administered medications for this visit.     Past Medical History  Diagnosis Date  . Acute asthmatic bronchitis   . Hypercholesterolemia   . Hypothyroidism   . Interstitial cystitis   . Fibromyalgia   . Anxiety     Past Surgical History  Procedure Laterality Date  . Total abdominal hysterectomy  1997    History   Social History  . Marital Status: Divorced    Spouse Name: N/A    Number of Children: 3  . Years of Education: N/A   Occupational History  . school teacher - substitute teaching now    Social History Main  Topics  . Smoking status: Never Smoker   . Smokeless tobacco: Never Used  . Alcohol Use: Yes     Comment: Rare  . Drug Use: No  . Sexual Activity: Not on file   Other Topics Concern  . Not on file   Social History Narrative   Plays piano, 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   Declines flu and pneumonia shot 04-30-10    ROS: fatigue but no fevers or chills, productive cough, hemoptysis, dysphasia, odynophagia, melena, hematochezia, dysuria, hematuria, rash, seizure activity, orthopnea, PND, pedal edema, claudication. Remaining systems are negative.  Physical Exam: Well-developed well-nourished in no acute distress.  Skin is warm and dry.  HEENT is normal.  Neck is supple.  Chest is clear to auscultation with normal expansion.  Cardiovascular exam is regular rate and rhythm.  Abdominal exam nontender or distended. No masses palpated. Extremities show no edema. neuro grossly intact

## 2014-03-13 NOTE — Discharge Instructions (Signed)
Radial Site Care °Refer to this sheet in the next few weeks. These instructions provide you with information on caring for yourself after your procedure. Your caregiver may also give you more specific instructions. Your treatment has been planned according to current medical practices, but problems sometimes occur. Call your caregiver if you have any problems or questions after your procedure. °HOME CARE INSTRUCTIONS °· You may shower the day after the procedure. Remove the bandage (dressing) and gently wash the site with plain soap and water. Gently pat the site dry. °· Do not apply powder or lotion to the site. °· Do not submerge the affected site in water for 3 to 5 days. °· Inspect the site at least twice daily. °· Do not flex or bend the affected arm for 24 hours. °· No lifting over 5 pounds (2.3 kg) for 5 days after your procedure. °· Do not drive home if you are discharged the same day of the procedure. Have someone else drive you. °· You may drive 24 hours after the procedure unless otherwise instructed by your caregiver. °· Do not operate machinery or power tools for 24 hours. °· A responsible adult should be with you for the first 24 hours after you arrive home. °What to expect: °· Any bruising will usually fade within 1 to 2 weeks. °· Blood that collects in the tissue (hematoma) may be painful to the touch. It should usually decrease in size and tenderness within 1 to 2 weeks. °SEEK IMMEDIATE MEDICAL CARE IF: °· You have unusual pain at the radial site. °· You have redness, warmth, swelling, or pain at the radial site. °· You have drainage (other than a small amount of blood on the dressing). °· You have chills. °· You have a fever or persistent symptoms for more than 72 hours. °· You have a fever and your symptoms suddenly get worse. °· Your arm becomes pale, cool, tingly, or numb. °· You have heavy bleeding from the site. Hold pressure on the site. °Document Released: 07/03/2010 Document Revised:  08/23/2011 Document Reviewed: 07/03/2010 °[employer]® Patient Information ©2015 [employer]. This information is not intended to replace advice given to you by your health care provider. Make sure you discuss any questions you have with your health care provider. ° °

## 2014-03-13 NOTE — CV Procedure (Signed)
    Cardiac Catheterization Procedure Note  Name: Jaclyn Bennett MRN: [id number] DOB: 1950-10-03  Procedure: Catheter placement for angiography, Selective Coronary Angiography  Indication: Abnormal Stress Echo   Procedural Details: The right wrist was prepped, draped, and anesthetized with 1% lidocaine. Using the modified Seldinger technique, a 5/6 French Slender sheath was introduced into the right radial artery. 3 mg of verapamil was administered through the sheath, weight-based unfractionated heparin was administered intravenously. Standard Judkins catheters were used for selective coronary angiography. Catheter exchanges were performed over an exchange length guidewire. There were no immediate procedural complications. A TR band was used for radial hemostasis at the completion of the procedure.  The patient had a vagal reaction near the end of the procedure. She responded well to IV fluids and zofran. Hemodynamics at the completion of the procedure are stable and unchanged from baseline.The patient was transferred to the post catheterization recovery area for further monitoring.  Procedural Findings: Hemodynamics: AO 123/71  Coronary angiography: Coronary dominance: right  Left mainstem: Widely patent, angiographically normal  Left anterior descending (LAD): Patent to the LV apex. The first diagonal is medium in caliber, the second diagonal is small. Angiographically normal vessels  Left circumflex (LCx): Angiographically normal vessels, the OM branches are patent  Right coronary artery (RCA): Dominant vessel, angiographically normal. The PDA is widely patent.  Estimated Blood Loss: minimal  Final Conclusions:   Angiographically normal coronary arteries  Recommendations: suspect noncardiac chest pain  Sherren Mocha MD, [facility] 03/13/2014, 12:46 PM

## 2014-03-14 ENCOUNTER — Telehealth: Payer: Self-pay | Admitting: Cardiology

## 2014-03-14 NOTE — Telephone Encounter (Signed)
Does this pt. Need to come back to see Dr. Stanford Breed

## 2014-03-14 NOTE — Telephone Encounter (Signed)
DC metoprolol Jaclyn Bennett  

## 2014-03-14 NOTE — Telephone Encounter (Signed)
Jaclyn Bennett is wanting to know if and when she needs to come back in to see him

## 2014-03-14 NOTE — Telephone Encounter (Signed)
Spoke with pt, she was given a script for metoprolol when we first saw her. She never started the medicine, her cath was normal and she wonders if that is something she needs to start or not. Will forward for dr Stanford Breed review

## 2014-03-14 NOTE — Telephone Encounter (Signed)
Spoke with pt, Aware of dr crenshaw's recommendations.  °

## 2014-03-20 ENCOUNTER — Telehealth: Payer: Self-pay | Admitting: Pulmonary Disease

## 2014-03-20 NOTE — Telephone Encounter (Signed)
Spoke with the pt and notified of recs per SN She verbalized understanding  She prefers to call us at the beginning of the wk to schedule appt

## 2014-03-20 NOTE — Telephone Encounter (Signed)
Pt states she had a heart catheterization last wk with Dr Burt Knack & continues to feel real weak.  Pt states that the heart cath came back normal -- no sign of abnormalities.  Pt states not sure if an appt is needed ? Pt c/o decreased energy levels, increased fatigued and weak-- worse since heart cath. 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  Pt states that Dr Elyn Aquas is out of office until late this week. NO other availabilities at new PCP office. Pt is established with Dr Hassell Done.   Pt wanting to know if any rec's can be given even those she is established with new PCP.   Please advise Dr Lenna Gilford. Thanks.

## 2014-03-20 NOTE — Telephone Encounter (Signed)
Per SN---  womens MVI Extra vit d 1000units daily Rest and fluids Plan ROV next week with SN if needed

## 2014-03-29 ENCOUNTER — Telehealth: Payer: Self-pay | Admitting: Physician Assistant

## 2014-03-29 DIAGNOSIS — B029 Zoster without complications: Secondary | ICD-10-CM

## 2014-03-29 MED ORDER — FAMCICLOVIR 500 MG PO TABS: 500.0000 mg | ORAL_TABLET | Freq: Three times a day (TID) | ORAL | Status: DC

## 2014-03-29 NOTE — Telephone Encounter (Signed)
Patient informed, understood & agreed/SLS  

## 2014-03-29 NOTE — Telephone Encounter (Signed)
It appears that Dr. Lenna Gilford has previously treated pt for Shingles [last documentation 03.09.15], but no documentation of being Tx under our care since establishing in May/SLS Please Advise.

## 2014-03-29 NOTE — Telephone Encounter (Signed)
Caller name:Daddona, Lemmie Evens Relation to pt: self  Call back number:[phone] Pharmacy: [place], [place] - [address] [phone]    Reason for call:   pt states she is stressed out currently experiencing an outbreak of reoccurring shingles on her lower back near her helps (pt states your are aware of her ongoing shingles outbreaks). Pt requesting a refill of predniSONE (DELTASONE) 20 MG tablet & Famciclovir.

## 2014-03-29 NOTE — Telephone Encounter (Signed)
Rx Famciclovir sent in to pharmacy.  Dont typically prescribed prednisone as it lowers immune system and has potential to decrease effectiveness of the Famciclovir.

## 2014-04-22 ENCOUNTER — Ambulatory Visit (INDEPENDENT_AMBULATORY_CARE_PROVIDER_SITE_OTHER): Payer: BC Managed Care – PPO | Admitting: Physician Assistant

## 2014-04-22 ENCOUNTER — Encounter: Payer: Self-pay | Admitting: Physician Assistant

## 2014-04-22 VITALS — BP 97/47 | HR 75 | Temp 98.3°F | Resp 16 | Ht 63.0 in | Wt 118.0 lb

## 2014-04-22 DIAGNOSIS — B9689 Other specified bacterial agents as the cause of diseases classified elsewhere: Secondary | ICD-10-CM | POA: Insufficient documentation

## 2014-04-22 DIAGNOSIS — J019 Acute sinusitis, unspecified: Secondary | ICD-10-CM

## 2014-04-22 MED ORDER — AMOXICILLIN-POT CLAVULANATE 875-125 MG PO TABS: 1.0000 | ORAL_TABLET | Freq: Two times a day (BID) | ORAL | Status: DC

## 2014-04-22 NOTE — Assessment & Plan Note (Signed)
Rx Doxycycline.  Increase fluids.  Rest.  Saline nasal spray.  Probiotic.  Mucinex as directed.  Humidifier in bedroom.  Call or return to clinic if symptoms are not improving.  

## 2014-04-22 NOTE — Progress Notes (Signed)
Patient presents to clinic today c/o sinus pressure, sinus pain, bilateral ear pressure/pain and productive cough x 4 days.  Patient endorses symptoms have rapidly worsened over the past couple of days.  Denies fever, chills, aches.  Denies recent travel.  Denies SOB or pleuritic chest pain. Has been taking Mucinex-D daily.   Past Medical History  Diagnosis Date  . Acute asthmatic bronchitis   . Hypercholesterolemia   . Hypothyroidism   . Interstitial cystitis   . Fibromyalgia   . Anxiety     Current Outpatient Prescriptions on File Prior to Visit  Medication Sig Dispense Refill  . ALPRAZolam (XANAX) 0.5 MG tablet Take 1 tablet (0.5 mg total) by mouth 3 (three) times daily as needed for anxiety. 90 tablet 0  . aspirin 81 MG tablet Take 81 mg by mouth daily.      Marland Kitchen atorvastatin (LIPITOR) 20 MG tablet Take 10 mg by mouth daily.    . Cholecalciferol (VITAMIN D) 1000 UNITS capsule Take 1,000 Units by mouth daily.      . Coconut Oil 1000 MG CAPS Take 1,000 mg by mouth daily.     . Coenzyme Q10 (COQ10) 100 MG CAPS Take 1 capsule by mouth daily.     . cyclobenzaprine (FLEXERIL) 10 MG tablet Take 1 tablet (10 mg total) by mouth daily. 90 tablet 1  . estradiol (ESTRACE) 0.5 MG tablet Take 0.25 mg by mouth daily.     . famotidine (PEPCID) 20 MG tablet Take one tablet the night before and one tablet the morning of procedure 2 tablet 0  . levothyroxine (SYNTHROID, LEVOTHROID) 75 MCG tablet Take 1 tablet (75 mcg total) by mouth daily. 30 tablet 0  . NON FORMULARY Take 3 tablets by mouth daily. Bone-Up: Calcium Supplement    . vitamin C (ASCORBIC ACID) 500 MG tablet Take 1,000 mg by mouth daily.     . vitamin E 400 UNIT capsule Take 400 Units by mouth daily.       No current facility-administered medications on file prior to visit.    Allergies  Allergen Reactions  . Iodine Anaphylaxis  . Anesthetics, Amide Nausea And Vomiting  . Sulfonamide Derivatives     REACTION: throat swelling and  itching  . Valtrex [Valacyclovir Hcl]     "made me deathly sick"    Family History  Problem Relation Age of Onset  . Alzheimer's disease Father   . Stroke Father   . Heart failure Father   . Other Mother     bronchiectasis  . Pneumonia Mother   . Heart failure Mother   . Pancreatic cancer Brother   . Hyperlipidemia Sister   . Other Sister     bronchiectasis    History   Social History  . Marital Status: Divorced    Spouse Name: N/A    Number of Children: 3  . Years of Education: N/A   Occupational History  . school teacher - substitute teaching now    Social History Main Topics  . Smoking status: Never Smoker   . Smokeless tobacco: Never Used  . Alcohol Use: Yes     Comment: Rare  . Drug Use: No  . Sexual Activity: None   Other Topics Concern  . None   Social History Narrative   Plays piano, 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   Declines flu and pneumonia shot 04-30-10   Review of Systems - See HPI.  All other ROS are negative.  BP 97/47 mmHg  Pulse 75  Temp(Src) 98.3 F (36.8 C) (Oral)  Resp 16  Ht _0  (1.6 m)  Wt 118 lb (53.524 kg)  BMI 20.91 kg/m2  SpO2 100%  Physical Exam  Constitutional: She is oriented to person, place, and time and well-developed, well-nourished, and in no distress.  HENT:  Head: Normocephalic and atraumatic.  Right Ear: Tympanic membrane, external ear and ear canal normal.  Left Ear: Tympanic membrane, external ear and ear canal normal.  Nose: Right sinus exhibits frontal sinus tenderness. Left sinus exhibits frontal sinus tenderness.  Mouth/Throat: Uvula is midline, oropharynx is clear and moist and mucous membranes are normal. No oropharyngeal exudate.  Eyes: Conjunctivae are normal.  Neck: Neck supple.  Cardiovascular: Normal rate, regular rhythm, normal heart sounds and intact distal pulses.   Pulmonary/Chest: Effort normal and breath sounds normal. No respiratory distress. She has no wheezes. She has no rales. She exhibits no  tenderness.  Neurological: She is alert and oriented to person, place, and time.  Skin: Skin is warm and dry. No rash noted.  Psychiatric: Affect normal.  Vitals reviewed.   Recent Results (from the past 2160 hour(s))  BMP with Estimated GFR ([id number])     Status: None   Collection Time: 03/04/14  4:53 PM  Result Value Ref Range   Sodium 141 135 - 145 mEq/L   Potassium 4.4 3.5 - 5.3 mEq/L   Chloride 104 96 - 112 mEq/L   CO2 31 19 - 32 mEq/L   Glucose, Bld 91 70 - 99 mg/dL   BUN 15 6 - 23 mg/dL   Creat 0.83 0.50 - 1.10 mg/dL   Calcium 9.4 8.4 - 10.5 mg/dL   GFR, Est African American 87 mL/min   GFR, Est Non African American 75 mL/min    Comment:   The estimated GFR is a calculation valid for adults (>=19 years old) that uses the CKD-EPI algorithm to adjust for age and sex. It is   not to be used for children, pregnant women, hospitalized patients,    patients on dialysis, or with rapidly changing kidney function. According to the NKDEP, eGFR >89 is normal, 60-89 shows mild impairment, 30-59 shows moderate impairment, 15-29 shows severe impairment and <15 is ESRD.    CBC     Status: None   Collection Time: 03/04/14  4:53 PM  Result Value Ref Range   WBC 5.2 4.0 - 10.5 K/uL   RBC 4.25 3.87 - 5.11 MIL/uL   Hemoglobin 12.8 12.0 - 15.0 g/dL   HCT 38.7 36.0 - 46.0 %   MCV 91.1 78.0 - 100.0 fL   MCH 30.1 26.0 - 34.0 pg   MCHC 33.1 30.0 - 36.0 g/dL   RDW 12.7 11.5 - 15.5 %   Platelets 320 150 - 400 K/uL  INR/PT     Status: None   Collection Time: 03/04/14  4:53 PM  Result Value Ref Range   Prothrombin Time 13.5 11.6 - 15.2 seconds   INR 1.03 <1.50    Comment: The INR is of principal utility in following patients on stable doses of oral anticoagulants.  The therapeutic range is generally 2.0 to 3.0, but may be 3.0 to 4.0 in patients with mechanical cardiac valves, recurrent embolisms and antiphospholipid antibodies (including lupus inhibitors).    Assessment/Plan: Acute  bacterial sinusitis Rx Doxycycline.  Increase fluids.  Rest.  Saline nasal spray.  Probiotic.  Mucinex as directed.  Humidifier in bedroom.  Call or return to clinic if symptoms are not improving.

## 2014-04-22 NOTE — Patient Instructions (Signed)
Please take antibiotic as directed.  Increase fluid intake.  Use Saline nasal spray.  Take a daily multivitamin. Please continue other medications as directed.  Place a humidifier in the bedroom.  Please call or return clinic if symptoms are not improving.  Sinusitis Sinusitis is redness, soreness, and swelling (inflammation) of the paranasal sinuses. Paranasal sinuses are air pockets within the bones of your face (beneath the eyes, the middle of the forehead, or above the eyes). In healthy paranasal sinuses, mucus is able to drain out, and air is able to circulate through them by way of your nose. However, when your paranasal sinuses are inflamed, mucus and air can become trapped. This can allow bacteria and other germs to grow and cause infection. Sinusitis can develop quickly and last only a short time (acute) or continue over a long period (chronic). Sinusitis that lasts for more than 12 weeks is considered chronic.  CAUSES  Causes of sinusitis include:  Allergies.  Structural abnormalities, such as displacement of the cartilage that separates your nostrils (deviated septum), which can decrease the air flow through your nose and sinuses and affect sinus drainage.  Functional abnormalities, such as when the small hairs (cilia) that line your sinuses and help remove mucus do not work properly or are not present. SYMPTOMS  Symptoms of acute and chronic sinusitis are the same. The primary symptoms are pain and pressure around the affected sinuses. Other symptoms include:  Upper toothache.  Earache.  Headache.  Bad breath.  Decreased sense of smell and taste.  A cough, which worsens when you are lying flat.  Fatigue.  Fever.  Thick drainage from your nose, which often is green and may contain pus (purulent).  Swelling and warmth over the affected sinuses. DIAGNOSIS  Your caregiver will perform a physical exam. During the exam, your caregiver may:  Look in your nose for signs of  abnormal growths in your nostrils (nasal polyps).  Tap over the affected sinus to check for signs of infection.  View the inside of your sinuses (endoscopy) with a special imaging device with a light attached (endoscope), which is inserted into your sinuses. If your caregiver suspects that you have chronic sinusitis, one or more of the following tests may be recommended:  Allergy tests.  Nasal culture A sample of mucus is taken from your nose and sent to a lab and screened for bacteria.  Nasal cytology A sample of mucus is taken from your nose and examined by your caregiver to determine if your sinusitis is related to an allergy. TREATMENT  Most cases of acute sinusitis are related to a viral infection and will resolve on their own within 10 days. Sometimes medicines are prescribed to help relieve symptoms (pain medicine, decongestants, nasal steroid sprays, or saline sprays).  However, for sinusitis related to a bacterial infection, your caregiver will prescribe antibiotic medicines. These are medicines that will help kill the bacteria causing the infection.  Rarely, sinusitis is caused by a fungal infection. In theses cases, your caregiver will prescribe antifungal medicine. For some cases of chronic sinusitis, surgery is needed. Generally, these are cases in which sinusitis recurs more than 3 times per year, despite other treatments. HOME CARE INSTRUCTIONS   Drink plenty of water. Water helps thin the mucus so your sinuses can drain more easily.  Use a humidifier.  Inhale steam 3 to 4 times a day (for example, sit in the bathroom with the shower running).  Apply a warm, moist washcloth to your face  3 to 4 times a day, or as directed by your caregiver.  Use saline nasal sprays to help moisten and clean your sinuses.  Take over-the-counter or prescription medicines for pain, discomfort, or fever only as directed by your caregiver. SEEK IMMEDIATE MEDICAL CARE IF:  You have increasing  pain or severe headaches.  You have nausea, vomiting, or drowsiness.  You have swelling around your face.  You have vision problems.  You have a stiff neck.  You have difficulty breathing. MAKE SURE YOU:   Understand these instructions.  Will watch your condition.  Will get help right away if you are not doing well or get worse. Document Released: 05/31/2005 Document Revised: 08/23/2011 Document Reviewed: 06/15/2011 [facility] Patient Information 2014 [place], [place].

## 2014-04-22 NOTE — Progress Notes (Signed)
Pre visit review using our clinic review tool, if applicable. No additional management support is needed unless otherwise documented below in the visit note/SLS  

## 2014-05-21 ENCOUNTER — Telehealth: Payer: Self-pay | Admitting: Physician Assistant

## 2014-05-21 DIAGNOSIS — B029 Zoster without complications: Secondary | ICD-10-CM

## 2014-05-21 MED ORDER — FAMCICLOVIR 500 MG PO TABS: 500.0000 mg | ORAL_TABLET | Freq: Three times a day (TID) | ORAL | Status: DC

## 2014-05-21 MED ORDER — ATORVASTATIN CALCIUM 20 MG PO TABS: 10.0000 mg | ORAL_TABLET | Freq: Every day | ORAL | Status: DC

## 2014-05-21 MED ORDER — LEVOTHYROXINE SODIUM 75 MCG PO TABS: 75.0000 ug | ORAL_TABLET | Freq: Every day | ORAL | Status: DC

## 2014-05-21 MED ORDER — CYCLOBENZAPRINE HCL 10 MG PO TABS: 10.0000 mg | ORAL_TABLET | Freq: Every day | ORAL | Status: DC

## 2014-05-21 NOTE — Telephone Encounter (Signed)
Pt states have been stressed lately and experiencing flare -ups (Shingles). Pt states you would know about her history regarding her stress and flare ups.  Pt used calamine lotion with no improvement,  requesting shingle medication, to [facility].

## 2014-05-21 NOTE — Telephone Encounter (Signed)
Done.  Famciclovir sent in. Patient has tolerated medication well previously.

## 2014-05-21 NOTE — Telephone Encounter (Signed)
Caller name:Mormino, Marissah Relation to LN:LGXQ Call back number:[phone] Pharmacy:  Reason for call: pt would like for [facility] to give her a call, states its regarding her having shingles, pt has had a crisis in her life again and shingles has come back. Call on cell if not at home [phone].

## 2014-05-23 ENCOUNTER — Encounter (HOSPITAL_COMMUNITY): Payer: Self-pay | Admitting: Cardiovascular Disease

## 2014-07-11 ENCOUNTER — Encounter: Payer: Self-pay | Admitting: Cardiology

## 2014-08-20 ENCOUNTER — Ambulatory Visit (INDEPENDENT_AMBULATORY_CARE_PROVIDER_SITE_OTHER): Payer: BLUE CROSS/BLUE SHIELD | Admitting: Physician Assistant

## 2014-08-20 ENCOUNTER — Encounter: Payer: Self-pay | Admitting: Physician Assistant

## 2014-08-20 VITALS — BP 120/67 | HR 91 | Temp 98.1°F | Ht 63.0 in | Wt 121.4 lb

## 2014-08-20 DIAGNOSIS — J0100 Acute maxillary sinusitis, unspecified: Secondary | ICD-10-CM

## 2014-08-20 MED ORDER — FLUTICASONE PROPIONATE 50 MCG/ACT NA SUSP: 2.0000 | Freq: Every day | NASAL | Status: DC

## 2014-08-20 MED ORDER — AMOXICILLIN-POT CLAVULANATE 875-125 MG PO TABS: 1.0000 | ORAL_TABLET | Freq: Two times a day (BID) | ORAL | Status: DC

## 2014-08-20 NOTE — Progress Notes (Signed)
Duplicate note -- see below  Past Medical History  Diagnosis Date  . Acute asthmatic bronchitis   . Hypercholesterolemia   . Hypothyroidism   . Interstitial cystitis   . Fibromyalgia   . Anxiety     Current Outpatient Prescriptions on File Prior to Visit  Medication Sig Dispense Refill  . ALPRAZolam (XANAX) 0.5 MG tablet Take 1 tablet (0.5 mg total) by mouth 3 (three) times daily as needed for anxiety. 90 tablet 0  . aspirin 81 MG tablet Take 81 mg by mouth daily.      Marland Kitchen atorvastatin (LIPITOR) 20 MG tablet Take 0.5 tablets (10 mg total) by mouth daily. 90 tablet 1  . Cholecalciferol (VITAMIN D) 1000 UNITS capsule Take 1,000 Units by mouth daily.      . Coconut Oil 1000 MG CAPS Take 1,000 mg by mouth daily.     . Coenzyme Q10 (COQ10) 100 MG CAPS Take 1 capsule by mouth daily.     . cyclobenzaprine (FLEXERIL) 10 MG tablet Take 1 tablet (10 mg total) by mouth daily. 90 tablet 1  . estradiol (ESTRACE) 0.5 MG tablet Take 0.25 mg by mouth daily.     Marland Kitchen levothyroxine (SYNTHROID, LEVOTHROID) 75 MCG tablet Take 1 tablet (75 mcg total) by mouth daily. 90 tablet 1  . NON FORMULARY Take 3 tablets by mouth daily. Bone-Up: Calcium Supplement    . vitamin C (ASCORBIC ACID) 500 MG tablet Take 1,000 mg by mouth daily.     . vitamin E 400 UNIT capsule Take 400 Units by mouth daily.      Marland Kitchen amoxicillin-clavulanate (AUGMENTIN) 875-125 MG per tablet Take 1 tablet by mouth 2 (two) times daily. (Patient not taking: Reported on 08/20/2014) 20 tablet 0  . famciclovir (FAMVIR) 500 MG tablet Take 1 tablet (500 mg total) by mouth 3 (three) times daily. (Patient not taking: Reported on 08/20/2014) 21 tablet 0  . famotidine (PEPCID) 20 MG tablet Take one tablet the night before and one tablet the morning of procedure (Patient not taking: Reported on 08/20/2014) 2 tablet 0   No current facility-administered medications on file prior to visit.    Allergies  Allergen Reactions  . Iodine Anaphylaxis  . Anesthetics,  Amide Nausea And Vomiting  . Sulfonamide Derivatives     REACTION: throat swelling and itching  . Valtrex [Valacyclovir Hcl]     "made me deathly sick"    Family History  Problem Relation Age of Onset  . Alzheimer's disease Father   . Stroke Father   . Heart failure Father   . Other Mother     bronchiectasis  . Pneumonia Mother   . Heart failure Mother   . Pancreatic cancer Brother   . Hyperlipidemia Sister   . Other Sister     bronchiectasis    History   Social History  . Marital Status: Divorced    Spouse Name: N/A  . Number of Children: 3  . Years of Education: N/A   Occupational History  . school teacher - substitute teaching now    Social History Main Topics  . Smoking status: Never Smoker   . Smokeless tobacco: Never Used  . Alcohol Use: Yes     Comment: Rare  . Drug Use: No  . Sexual Activity: Not on file   Other Topics Concern  . None   Social History Narrative   Plays piano, 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   Declines flu and pneumonia shot 04-30-10    Review of  Systems - See HPI.  All other ROS are negative.  BP 120/67 mmHg  Pulse 91  Temp(Src) 98.1 F (36.7 C) (Oral)  Wt 121 lb 6 oz (55.055 kg)  SpO2 97%  Physical Exam  No results found for this or any previous visit (from the past 2160 hour(s)).  Assessment/Plan: No problem-specific assessment & plan notes found for this encounter.     Subjective:     Jaclyn Bennett is a 64 y.o. female who presents for evaluation of symptoms of a URI. Symptoms include right ear pressure/pain, achiness, congestion, nasal congestion, post nasal drip, sinus pressure, sore throat and tooth pain. Onset of symptoms was 3 weeks ago, and has been gradually worsening since that time. Treatment to date: none.  The following portions of the patient's history were reviewed and updated as appropriate: allergies, current medications, past family history, past medical history, past social history, past surgical history and  problem list.  Review of Systems Pertinent items are noted in HPI.   Objective:    BP 120/67 mmHg  Pulse 91  Temp(Src) 98.1 F (36.7 C) (Oral)  Ht 5\' 3"  (1.6 m)  Wt 121 lb 6 oz (55.055 kg)  BMI 21.51 kg/m2  SpO2 97% General appearance: alert, cooperative, appears stated age and no distress Head: Normocephalic, without obvious abnormality, atraumatic Ears: normal TM's and external ear canals both ears Nose: yellow discharge, moderate congestion, turbinates red, swollen Throat: lips, mucosa, and tongue normal; teeth and gums normal Lungs: clear to auscultation bilaterally Heart: regular rate and rhythm, S1, S2 normal, no murmur, click, rub or gallop   Assessment:    sinusitis   Plan:    Discussed the diagnosis and treatment of sinusitis. Suggested symptomatic OTC remedies. Nasal saline spray for congestion. Augmentin per orders. Nasal steroids per orders. Follow up as needed.

## 2014-08-20 NOTE — Progress Notes (Signed)
Pre visit review using our clinic review tool, if applicable. No additional management support is needed unless otherwise documented below in the visit note. 

## 2014-08-20 NOTE — Addendum Note (Signed)
Addended by: Raiford Noble on: 08/20/2014 10:53 AM   Modules accepted: Orders

## 2014-08-20 NOTE — Patient Instructions (Signed)
Please take antibiotic as directed.  Increase fluid intake.  Use Saline nasal spray.  Take a daily multivitamin. Use Flonase daily as directed.  Place a humidifier in the bedroom.  Please call or return clinic if symptoms are not improving.  Sinusitis Sinusitis is redness, soreness, and swelling (inflammation) of the paranasal sinuses. Paranasal sinuses are air pockets within the bones of your face (beneath the eyes, the middle of the forehead, or above the eyes). In healthy paranasal sinuses, mucus is able to drain out, and air is able to circulate through them by way of your nose. However, when your paranasal sinuses are inflamed, mucus and air can become trapped. This can allow bacteria and other germs to grow and cause infection. Sinusitis can develop quickly and last only a short time (acute) or continue over a long period (chronic). Sinusitis that lasts for more than 12 weeks is considered chronic.  CAUSES  Causes of sinusitis include:  Allergies.  Structural abnormalities, such as displacement of the cartilage that separates your nostrils (deviated septum), which can decrease the air flow through your nose and sinuses and affect sinus drainage.  Functional abnormalities, such as when the small hairs (cilia) that line your sinuses and help remove mucus do not work properly or are not present. SYMPTOMS  Symptoms of acute and chronic sinusitis are the same. The primary symptoms are pain and pressure around the affected sinuses. Other symptoms include:  Upper toothache.  Earache.  Headache.  Bad breath.  Decreased sense of smell and taste.  A cough, which worsens when you are lying flat.  Fatigue.  Fever.  Thick drainage from your nose, which often is green and may contain pus (purulent).  Swelling and warmth over the affected sinuses. DIAGNOSIS  Your caregiver will perform a physical exam. During the exam, your caregiver may:  Look in your nose for signs of abnormal growths  in your nostrils (nasal polyps).  Tap over the affected sinus to check for signs of infection.  View the inside of your sinuses (endoscopy) with a special imaging device with a light attached (endoscope), which is inserted into your sinuses. If your caregiver suspects that you have chronic sinusitis, one or more of the following tests may be recommended:  Allergy tests.  Nasal culture A sample of mucus is taken from your nose and sent to a lab and screened for bacteria.  Nasal cytology A sample of mucus is taken from your nose and examined by your caregiver to determine if your sinusitis is related to an allergy. TREATMENT  Most cases of acute sinusitis are related to a viral infection and will resolve on their own within 10 days. Sometimes medicines are prescribed to help relieve symptoms (pain medicine, decongestants, nasal steroid sprays, or saline sprays).  However, for sinusitis related to a bacterial infection, your caregiver will prescribe antibiotic medicines. These are medicines that will help kill the bacteria causing the infection.  Rarely, sinusitis is caused by a fungal infection. In theses cases, your caregiver will prescribe antifungal medicine. For some cases of chronic sinusitis, surgery is needed. Generally, these are cases in which sinusitis recurs more than 3 times per year, despite other treatments. HOME CARE INSTRUCTIONS   Drink plenty of water. Water helps thin the mucus so your sinuses can drain more easily.  Use a humidifier.  Inhale steam 3 to 4 times a day (for example, sit in the bathroom with the shower running).  Apply a warm, moist washcloth to your face 3  to 4 times a day, or as directed by your caregiver.  Use saline nasal sprays to help moisten and clean your sinuses.  Take over-the-counter or prescription medicines for pain, discomfort, or fever only as directed by your caregiver. SEEK IMMEDIATE MEDICAL CARE IF:  You have increasing pain or severe  headaches.  You have nausea, vomiting, or drowsiness.  You have swelling around your face.  You have vision problems.  You have a stiff neck.  You have difficulty breathing. MAKE SURE YOU:   Understand these instructions.  Will watch your condition.  Will get help right away if you are not doing well or get worse. Document Released: 05/31/2005 Document Revised: 08/23/2011 Document Reviewed: 06/15/2011 [facility] Patient Information 2014 [place], [place].

## 2014-09-03 ENCOUNTER — Telehealth: Payer: Self-pay | Admitting: Physician Assistant

## 2014-09-03 MED ORDER — AZITHROMYCIN 250 MG PO TABS: ORAL_TABLET | ORAL | Status: DC

## 2014-09-03 NOTE — Telephone Encounter (Signed)
Patient informed, understood & agreed/SLS  

## 2014-09-03 NOTE — Telephone Encounter (Signed)
Rx Azithromycin sent to pharmacy.  Follow-up if no improvement.

## 2014-09-03 NOTE — Telephone Encounter (Signed)
Caller name: Bertice Relation to pt: self Call back number:  [phone] Pharmacy: [facility]  Reason for call:   Patient has finished antibiotic but both ears are still hurting and wants to know what she should do

## 2014-09-24 NOTE — Progress Notes (Signed)
HPI: FU chest pain; previously seen for atypical CP; stress echo 9/15 showed baseline EF 45; inferior, septal and apical HK both at rest and with stress. Cardiac catheterization September 2015 showed normal coronary arteries. Since last seen, she denies dyspnea, chest pain, palpitations or syncope.  Current Outpatient Prescriptions  Medication Sig Dispense Refill  . ALPRAZolam (XANAX) 0.5 MG tablet Take 1 tablet (0.5 mg total) by mouth 3 (three) times daily as needed for anxiety. 90 tablet 0  . aspirin 81 MG tablet Take 81 mg by mouth daily.      Marland Kitchen atorvastatin (LIPITOR) 20 MG tablet Take 0.5 tablets (10 mg total) by mouth daily. 90 tablet 1  . Cholecalciferol (VITAMIN D) 1000 UNITS capsule Take 1,000 Units by mouth daily.      . Coconut Oil 1000 MG CAPS Take 1,000 mg by mouth daily.     . Coenzyme Q10 (COQ10) 100 MG CAPS Take 1 capsule by mouth daily.     . cyclobenzaprine (FLEXERIL) 10 MG tablet Take 1 tablet (10 mg total) by mouth daily. 90 tablet 1  . estradiol (ESTRACE) 0.5 MG tablet Take 0.25 mg by mouth daily.     Marland Kitchen levothyroxine (SYNTHROID, LEVOTHROID) 75 MCG tablet Take 1 tablet (75 mcg total) by mouth daily. 90 tablet 1  . NON FORMULARY Take 3 tablets by mouth daily. Bone-Up: Calcium Supplement    . vitamin C (ASCORBIC ACID) 500 MG tablet Take 1,000 mg by mouth daily.     . vitamin E 400 UNIT capsule Take 400 Units by mouth daily.       No current facility-administered medications for this visit.     Past Medical History  Diagnosis Date  . Acute asthmatic bronchitis   . Hypercholesterolemia   . Hypothyroidism   . Interstitial cystitis   . Fibromyalgia   . Anxiety     Past Surgical History  Procedure Laterality Date  . Total abdominal hysterectomy  1997  . Left heart catheterization with coronary angiogram Bilateral 03/13/2014    Procedure: LEFT HEART CATHETERIZATION WITH CORONARY ANGIOGRAM;  Surgeon: Blane Ohara, MD;  Location: [facility] CATH LAB;  Service:  Cardiovascular;  Laterality: Bilateral;    History   Social History  . Marital Status: Divorced    Spouse Name: N/A  . Number of Children: 3  . Years of Education: N/A   Occupational History  . school teacher - substitute teaching now    Social History Main Topics  . Smoking status: Never Smoker   . Smokeless tobacco: Never Used  . Alcohol Use: Yes     Comment: Rare  . Drug Use: No  . Sexual Activity: Not on file   Other Topics Concern  . Not on file   Social History Narrative   Plays piano, 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   Declines flu and pneumonia shot 04-30-10    ROS: some leg fatigue but no fevers or chills, productive cough, hemoptysis, dysphasia, odynophagia, melena, hematochezia, dysuria, hematuria, rash, seizure activity, orthopnea, PND, pedal edema, claudication. Remaining systems are negative.  Physical Exam: Well-developed well-nourished in no acute distress.  Skin is warm and dry.  HEENT is normal.  Neck is supple.  Chest is clear to auscultation with normal expansion.  Cardiovascular exam is regular rate and rhythm.  Abdominal exam nontender or distended. No masses palpated. Extremities show no edema. neuro grossly intact  ECG sinus rhythm at a rate of 80. Septal infarct. Inferior lateral ST depression

## 2014-09-27 ENCOUNTER — Encounter: Payer: Self-pay | Admitting: Cardiology

## 2014-09-27 ENCOUNTER — Ambulatory Visit (INDEPENDENT_AMBULATORY_CARE_PROVIDER_SITE_OTHER): Payer: BLUE CROSS/BLUE SHIELD | Admitting: Cardiology

## 2014-09-27 VITALS — BP 122/68 | HR 80 | Ht 63.0 in | Wt 120.6 lb

## 2014-09-27 DIAGNOSIS — E7800 Pure hypercholesterolemia, unspecified: Secondary | ICD-10-CM

## 2014-09-27 DIAGNOSIS — R072 Precordial pain: Secondary | ICD-10-CM

## 2014-09-27 DIAGNOSIS — R9431 Abnormal electrocardiogram [ECG] [EKG]: Secondary | ICD-10-CM | POA: Diagnosis not present

## 2014-09-27 NOTE — Assessment & Plan Note (Signed)
Continue statin. 

## 2014-09-27 NOTE — Assessment & Plan Note (Signed)
Catheterization revealed no obstructive disease.

## 2014-09-27 NOTE — Assessment & Plan Note (Signed)
No further chest pain. Catheterization revealed no obstructive coronary disease. No further evaluation at this point. Note her LV function was mildly reduced on previous echo. We will plan to repeat that when she returns in 1 year area

## 2014-09-27 NOTE — Patient Instructions (Signed)
Your physician wants you to follow-up in: ONE YEAR WITH DR CRENSHAW You will receive a reminder letter in the mail two months in advance. If you don't receive a letter, please call our office to schedule the follow-up appointment.  

## 2014-11-06 ENCOUNTER — Ambulatory Visit (INDEPENDENT_AMBULATORY_CARE_PROVIDER_SITE_OTHER): Payer: BLUE CROSS/BLUE SHIELD | Admitting: Physician Assistant

## 2014-11-06 ENCOUNTER — Encounter: Payer: Self-pay | Admitting: Physician Assistant

## 2014-11-06 VITALS — BP 115/47 | HR 75 | Temp 98.0°F | Ht 63.0 in | Wt 119.8 lb

## 2014-11-06 DIAGNOSIS — H6990 Unspecified Eustachian tube disorder, unspecified ear: Secondary | ICD-10-CM | POA: Insufficient documentation

## 2014-11-06 DIAGNOSIS — H6993 Unspecified Eustachian tube disorder, bilateral: Secondary | ICD-10-CM | POA: Diagnosis not present

## 2014-11-06 NOTE — Patient Instructions (Signed)
Start your Flonase daily. Begin a Claritin-D 24 hour tablet once daily.   Stay well hydrated. Follow-up in 1.5 weeks if symptoms are not resolving.

## 2014-11-06 NOTE — Assessment & Plan Note (Signed)
Supportive measures reviewed.  Rx Flonase daily -- patient has at home and will restart.  Begin Claritin-D 24 hour tablet daily. Saline nasal spray.  Follow-up if symptoms not resolving.

## 2014-11-06 NOTE — Progress Notes (Signed)
Patient presents to clinic today c/o bilateral ear pressure and pain over the past 2-3 weeks.  Denies cough, sore throat, sinus pressure or sinus pain.  Endorses PND.  Denies fever, chills, aches or recent travel.  Endorses sick contact.  Past Medical History  Diagnosis Date  . Acute asthmatic bronchitis   . Hypercholesterolemia   . Hypothyroidism   . Interstitial cystitis   . Fibromyalgia   . Anxiety     Current Outpatient Prescriptions on File Prior to Visit  Medication Sig Dispense Refill  . ALPRAZolam (XANAX) 0.5 MG tablet Take 1 tablet (0.5 mg total) by mouth 3 (three) times daily as needed for anxiety. 90 tablet 0  . aspirin 81 MG tablet Take 81 mg by mouth daily.      Marland Kitchen atorvastatin (LIPITOR) 20 MG tablet Take 0.5 tablets (10 mg total) by mouth daily. 90 tablet 1  . Cholecalciferol (VITAMIN D) 1000 UNITS capsule Take 1,000 Units by mouth daily.      . Coconut Oil 1000 MG CAPS Take 1,000 mg by mouth daily.     . Coenzyme Q10 (COQ10) 100 MG CAPS Take 1 capsule by mouth daily.     . cyclobenzaprine (FLEXERIL) 10 MG tablet Take 1 tablet (10 mg total) by mouth daily. 90 tablet 1  . estradiol (ESTRACE) 0.5 MG tablet Take 0.25 mg by mouth daily.     Marland Kitchen levothyroxine (SYNTHROID, LEVOTHROID) 75 MCG tablet Take 1 tablet (75 mcg total) by mouth daily. 90 tablet 1  . NON FORMULARY Take 3 tablets by mouth daily. Bone-Up: Calcium Supplement    . vitamin C (ASCORBIC ACID) 500 MG tablet Take 1,000 mg by mouth daily.     . vitamin E 400 UNIT capsule Take 400 Units by mouth daily.       No current facility-administered medications on file prior to visit.    Allergies  Allergen Reactions  . Iodine Anaphylaxis  . Anesthetics, Amide Nausea And Vomiting  . Sulfonamide Derivatives     REACTION: throat swelling and itching  . Valtrex [Valacyclovir Hcl]     "made me deathly sick"    Family History  Problem Relation Age of Onset  . Alzheimer's disease Father   . Stroke Father   . Heart  failure Father   . Other Mother     bronchiectasis  . Pneumonia Mother   . Heart failure Mother   . Pancreatic cancer Brother   . Hyperlipidemia Sister   . Other Sister     bronchiectasis    History   Social History  . Marital Status: Divorced    Spouse Name: N/A  . Number of Children: 3  . Years of Education: N/A   Occupational History  . school teacher - substitute teaching now    Social History Main Topics  . Smoking status: Never Smoker   . Smokeless tobacco: Never Used  . Alcohol Use: Yes     Comment: Rare  . Drug Use: No  . Sexual Activity: Not on file   Other Topics Concern  . None   Social History Narrative   Plays piano, 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   Declines flu and pneumonia shot 04-30-10   Review of Systems - See HPI.  All other ROS are negative.  BP 115/47 mmHg  Pulse 75  Temp(Src) 98 F (36.7 C) (Oral)  Ht 5\' 3"  (1.6 m)  Wt 119 lb 12.8 oz (54.341 kg)  BMI 21.23 kg/m2  SpO2 99%  Physical Exam  Constitutional: She is oriented to person, place, and time and well-developed, well-nourished, and in no distress.  HENT:  Head: Normocephalic and atraumatic.  Right Ear: External ear normal. Tympanic membrane is retracted.  Left Ear: External ear normal. Tympanic membrane is retracted.  Nose: Nose normal.  Mouth/Throat: Oropharynx is clear and moist. No oropharyngeal exudate.  Eyes: Conjunctivae are normal. Pupils are equal, round, and reactive to light.  Neck: Neck supple.  Cardiovascular: Normal rate, regular rhythm, normal heart sounds and intact distal pulses.   Pulmonary/Chest: Effort normal and breath sounds normal. No respiratory distress. She has no wheezes. She has no rales. She exhibits no tenderness.  Lymphadenopathy:    She has no cervical adenopathy.  Neurological: She is alert and oriented to person, place, and time.  Skin: Skin is warm and dry. No rash noted.  Psychiatric: Affect normal.  Vitals reviewed.  Assessment/Plan: Eustachian tube  disorder Supportive measures reviewed.  Rx Flonase daily -- patient has at home and will restart.  Begin Claritin-D 24 hour tablet daily. Saline nasal spray.  Follow-up if symptoms not resolving.

## 2014-11-06 NOTE — Progress Notes (Signed)
Pre visit review using our clinic review tool, if applicable. No additional management support is needed unless otherwise documented below in the visit note. 

## 2014-11-28 ENCOUNTER — Telehealth: Payer: Self-pay | Admitting: Physician Assistant

## 2014-11-28 NOTE — Telephone Encounter (Signed)
Please advise.//AB/CMA 

## 2014-11-28 NOTE — Telephone Encounter (Signed)
Caller name: Nevayah Relation to pt: self Call back number: [phone] Pharmacy:  Reason for call:   Patient states that she talked to [facility] about coming in for labs one week prior to her cpe. Is this ok? If so, please order labs so I can schedule lab appt. thanks

## 2014-11-29 ENCOUNTER — Other Ambulatory Visit: Payer: Self-pay | Admitting: Physician Assistant

## 2014-11-29 DIAGNOSIS — E038 Other specified hypothyroidism: Secondary | ICD-10-CM

## 2014-11-29 DIAGNOSIS — Z299 Encounter for prophylactic measures, unspecified: Secondary | ICD-10-CM

## 2014-11-29 NOTE — Telephone Encounter (Signed)
Orders placed.  You can schedule lab appointment.

## 2014-11-29 NOTE — Telephone Encounter (Signed)
Lab appointment scheduled 

## 2014-12-02 ENCOUNTER — Other Ambulatory Visit: Payer: Self-pay | Admitting: Physician Assistant

## 2014-12-02 MED ORDER — FAMCICLOVIR 500 MG PO TABS: 500.0000 mg | ORAL_TABLET | Freq: Three times a day (TID) | ORAL | Status: DC

## 2014-12-02 NOTE — Telephone Encounter (Signed)
Caller name: Venita Lick Relationship to patient: self Can be reached: [phone] before 1:30pm, [phone] after 1:30pm Pharmacy: [facility]  Reason for call: Pt called stating she is having another break out of shingles. She said the last flare up was last October. Pt states that she did not need appt for this as you've seen her before and called it in for her. She is wanting refill on famciclovir (FAMVIR) 500 MG tablet.

## 2014-12-02 NOTE — Telephone Encounter (Signed)
Medication sent to pharmacy  

## 2014-12-10 ENCOUNTER — Telehealth: Payer: Self-pay | Admitting: Physician Assistant

## 2014-12-10 MED ORDER — CYCLOBENZAPRINE HCL 10 MG PO TABS: 10.0000 mg | ORAL_TABLET | Freq: Every day | ORAL | Status: DC

## 2014-12-10 NOTE — Telephone Encounter (Signed)
Requesting Flexeril 10mg -Take 1 tablet by mouth daily. Last refill:05-21-14;#90,1 Last OV:11-06-14 Please advise.//AB/CMA

## 2014-12-10 NOTE — Telephone Encounter (Signed)
Patient should not be out of medication based on signature. Should have received 90 tablets one month ago and is instructed to take once daily.  There is also a refill left on her medication. She should call pharmacy to refill but no further refills will be given without office visit.

## 2014-12-10 NOTE — Telephone Encounter (Signed)
Relation to pt: self  Call back number: [phone] Pharmacy: [facility] ([place]) [place], [place] [phone] (Phone) [phone] (Fax)         Reason for call:  Pt requesting 90 day supply cyclobenzaprine (FLEXERIL) 10 MG tablet

## 2014-12-10 NOTE — Telephone Encounter (Signed)
Rx sent to the pharmacy by e-script.  Pt is scheduled for OV on (12-31-14).//AB/CMA

## 2014-12-10 NOTE — Telephone Encounter (Signed)
Pt called back. She said her phone went out. She said to call her cell ph # [phone]. She said that she had the 1 refill used on 09/06/14. She is only taking 1/night. She has 23 pills left right now. She will be going out of town and this needs sent to [facility] and usually takes 10 days to receive. She said she'll be out when she comes back for her appt on 12/24/14. Please follow up with pt.

## 2014-12-11 NOTE — Telephone Encounter (Signed)
Pt aware.//AB/CMA 

## 2014-12-17 ENCOUNTER — Telehealth: Payer: Self-pay | Admitting: Physician Assistant

## 2014-12-17 NOTE — Telephone Encounter (Signed)
pre visit letter mailed 12/10/14

## 2014-12-24 ENCOUNTER — Other Ambulatory Visit (INDEPENDENT_AMBULATORY_CARE_PROVIDER_SITE_OTHER): Payer: BLUE CROSS/BLUE SHIELD

## 2014-12-24 ENCOUNTER — Encounter: Payer: Self-pay | Admitting: *Deleted

## 2014-12-24 DIAGNOSIS — E038 Other specified hypothyroidism: Secondary | ICD-10-CM

## 2014-12-24 DIAGNOSIS — Z0000 Encounter for general adult medical examination without abnormal findings: Secondary | ICD-10-CM

## 2014-12-24 DIAGNOSIS — Z299 Encounter for prophylactic measures, unspecified: Secondary | ICD-10-CM

## 2014-12-24 DIAGNOSIS — Z418 Encounter for other procedures for purposes other than remedying health state: Secondary | ICD-10-CM | POA: Diagnosis not present

## 2014-12-24 LAB — CBC
HEMATOCRIT: 39.4 % (ref 36.0–46.0)
Hemoglobin: 13.1 g/dL (ref 12.0–15.0)
MCHC: 33.3 g/dL (ref 30.0–36.0)
MCV: 93.1 fl (ref 78.0–100.0)
Platelets: 306 10*3/uL (ref 150.0–400.0)
RBC: 4.24 Mil/uL (ref 3.87–5.11)
RDW: 13.3 % (ref 11.5–15.5)
WBC: 4.8 10*3/uL (ref 4.0–10.5)

## 2014-12-24 LAB — BASIC METABOLIC PANEL
BUN: 16 mg/dL (ref 6–23)
CO2: 33 mEq/L — ABNORMAL HIGH (ref 19–32)
CREATININE: 0.8 mg/dL (ref 0.40–1.20)
Calcium: 9.3 mg/dL (ref 8.4–10.5)
Chloride: 104 mEq/L (ref 96–112)
GFR: 76.65 mL/min (ref >60.00)
Glucose, Bld: 88 mg/dL (ref 70–99)
POTASSIUM: 3.9 meq/L (ref 3.5–5.1)
Sodium: 142 mEq/L (ref 135–145)

## 2014-12-24 LAB — HEPATIC FUNCTION PANEL
ALT: 26 U/L (ref 0–35)
AST: 25 U/L (ref 0–37)
Albumin: 4 g/dL (ref 3.5–5.2)
Alkaline Phosphatase: 89 U/L (ref 39–117)
Bilirubin, Direct: 0.1 mg/dL (ref 0.0–0.3)
TOTAL PROTEIN: 6.9 g/dL (ref 6.0–8.3)
Total Bilirubin: 0.5 mg/dL (ref 0.2–1.2)

## 2014-12-24 LAB — URINALYSIS, ROUTINE W REFLEX MICROSCOPIC
Bilirubin Urine: NEGATIVE
Hgb urine dipstick: NEGATIVE
Ketones, ur: NEGATIVE
Leukocytes, UA: NEGATIVE
Nitrite: NEGATIVE
RBC / HPF: NONE SEEN (ref 0–?)
Specific Gravity, Urine: 1.01 (ref 1.000–1.030)
Total Protein, Urine: NEGATIVE
UROBILINOGEN UA: 0.2 (ref 0.0–1.0)
Urine Glucose: NEGATIVE
WBC, UA: NONE SEEN (ref 0–?)
pH: 7 (ref 5.0–8.0)

## 2014-12-24 LAB — LIPID PANEL
CHOL/HDL RATIO: 3
Cholesterol: 154 mg/dL (ref 0–200)
HDL: 51.2 mg/dL (ref >39.00)
LDL Cholesterol: 80 mg/dL (ref 0–99)
NonHDL: 102.8
Triglycerides: 113 mg/dL (ref 0.0–149.0)
VLDL: 22.6 mg/dL (ref 0.0–40.0)

## 2014-12-24 LAB — TSH: TSH: 2.53 u[IU]/mL (ref 0.35–4.50)

## 2014-12-24 LAB — VITAMIN D 25 HYDROXY (VIT D DEFICIENCY, FRACTURES): VITD: 44.94 ng/mL (ref 30.00–100.00)

## 2014-12-30 ENCOUNTER — Encounter: Payer: Self-pay | Admitting: *Deleted

## 2014-12-30 ENCOUNTER — Telehealth: Payer: Self-pay | Admitting: *Deleted

## 2014-12-30 NOTE — Telephone Encounter (Signed)
Pre-Visit Call completed with patient and chart updated.   Pre-Visit Info documented in Specialty Comments under [employer].    

## 2014-12-31 ENCOUNTER — Encounter: Payer: Self-pay | Admitting: Physician Assistant

## 2014-12-31 ENCOUNTER — Ambulatory Visit (INDEPENDENT_AMBULATORY_CARE_PROVIDER_SITE_OTHER): Payer: BLUE CROSS/BLUE SHIELD | Admitting: Physician Assistant

## 2014-12-31 VITALS — BP 110/58 | HR 84 | Temp 98.0°F | Ht 63.0 in | Wt 121.4 lb

## 2014-12-31 DIAGNOSIS — E7800 Pure hypercholesterolemia, unspecified: Secondary | ICD-10-CM

## 2014-12-31 DIAGNOSIS — Z0000 Encounter for general adult medical examination without abnormal findings: Secondary | ICD-10-CM | POA: Diagnosis not present

## 2014-12-31 DIAGNOSIS — E039 Hypothyroidism, unspecified: Secondary | ICD-10-CM | POA: Diagnosis not present

## 2014-12-31 DIAGNOSIS — H6993 Unspecified Eustachian tube disorder, bilateral: Secondary | ICD-10-CM | POA: Diagnosis not present

## 2014-12-31 MED ORDER — ALPRAZOLAM 0.5 MG PO TABS: 0.5000 mg | ORAL_TABLET | Freq: Three times a day (TID) | ORAL | Status: DC | PRN

## 2014-12-31 MED ORDER — LEVOTHYROXINE SODIUM 75 MCG PO TABS: 75.0000 ug | ORAL_TABLET | Freq: Every day | ORAL | Status: DC

## 2014-12-31 NOTE — Progress Notes (Signed)
Pre visit review using our clinic review tool, if applicable. No additional management support is needed unless otherwise documented below in the visit note. 

## 2014-12-31 NOTE — Patient Instructions (Signed)
Please continue medications as directed. Again, all of your labs looked great.  Keep your phone on as you will be contacted by ENT for assessment.  Follow-up 6 months.  Preventive Care for Adults A healthy lifestyle and preventive care can promote health and wellness. Preventive health guidelines for women include the following key practices.  A routine yearly physical is a good way to check with your health care provider about your health and preventive screening. It is a chance to share any concerns and updates on your health and to receive a thorough exam.  Visit your dentist for a routine exam and preventive care every 6 months. Brush your teeth twice a day and floss once a day. Good oral hygiene prevents tooth decay and gum disease.  The frequency of eye exams is based on your age, health, family medical history, use of contact lenses, and other factors. Follow your health care provider's recommendations for frequency of eye exams.  Eat a healthy diet. Foods like vegetables, fruits, whole grains, low-fat dairy products, and lean protein foods contain the nutrients you need without too many calories. Decrease your intake of foods high in solid fats, added sugars, and salt. Eat the right amount of calories for you.Get information about a proper diet from your health care provider, if necessary.  Regular physical exercise is one of the most important things you can do for your health. Most adults should get at least 150 minutes of moderate-intensity exercise (any activity that increases your heart rate and causes you to sweat) each week. In addition, most adults need muscle-strengthening exercises on 2 or more days a week.  Maintain a healthy weight. The body mass index (BMI) is a screening tool to identify possible weight problems. It provides an estimate of body fat based on height and weight. Your health care provider can find your BMI and can help you achieve or maintain a healthy  weight.For adults 20 years and older:  A BMI below 18.5 is considered underweight.  A BMI of 18.5 to 24.9 is normal.  A BMI of 25 to 29.9 is considered overweight.  A BMI of 30 and above is considered obese.  Maintain normal blood lipids and cholesterol levels by exercising and minimizing your intake of saturated fat. Eat a balanced diet with plenty of fruit and vegetables. Blood tests for lipids and cholesterol should begin at age 83 and be repeated every 5 years. If your lipid or cholesterol levels are high, you are over 50, or you are at high risk for heart disease, you may need your cholesterol levels checked more frequently.Ongoing high lipid and cholesterol levels should be treated with medicines if diet and exercise are not working.  If you smoke, find out from your health care provider how to quit. If you do not use tobacco, do not start.  Lung cancer screening is recommended for adults aged 68-80 years who are at high risk for developing lung cancer because of a history of smoking. A yearly low-dose CT scan of the lungs is recommended for people who have at least a 30-pack-year history of smoking and are a current smoker or have quit within the past 15 years. A pack year of smoking is smoking an average of 1 pack of cigarettes a day for 1 year (for example: 1 pack a day for 30 years or 2 packs a day for 15 years). Yearly screening should continue until the smoker has stopped smoking for at least 15 years. Yearly screening should  be stopped for people who develop a health problem that would prevent them from having lung cancer treatment.  If you are pregnant, do not drink alcohol. If you are breastfeeding, be very cautious about drinking alcohol. If you are not pregnant and choose to drink alcohol, do not have more than 1 drink per day. One drink is considered to be 12 ounces (355 mL) of beer, 5 ounces (148 mL) of wine, or 1.5 ounces (44 mL) of liquor.  Avoid use of street drugs. Do not  share needles with anyone. Ask for help if you need support or instructions about stopping the use of drugs.  High blood pressure causes heart disease and increases the risk of stroke. Your blood pressure should be checked at least every 1 to 2 years. Ongoing high blood pressure should be treated with medicines if weight loss and exercise do not work.  If you are 79-33 years old, ask your health care provider if you should take aspirin to prevent strokes.  Diabetes screening involves taking a blood sample to check your fasting blood sugar level. This should be done once every 3 years, after age 26, if you are within normal weight and without risk factors for diabetes. Testing should be considered at a younger age or be carried out more frequently if you are overweight and have at least 1 risk factor for diabetes.  Breast cancer screening is essential preventive care for women. You should practice "breast self-awareness." This means understanding the normal appearance and feel of your breasts and may include breast self-examination. Any changes detected, no matter how small, should be reported to a health care provider. Women in their 62s and 30s should have a clinical breast exam (CBE) by a health care provider as part of a regular health exam every 1 to 3 years. After age 1, women should have a CBE every year. Starting at age 77, women should consider having a mammogram (breast X-ray test) every year. Women who have a family history of breast cancer should talk to their health care provider about genetic screening. Women at a high risk of breast cancer should talk to their health care providers about having an MRI and a mammogram every year.  Breast cancer gene (BRCA)-related cancer risk assessment is recommended for women who have family members with BRCA-related cancers. BRCA-related cancers include breast, ovarian, tubal, and peritoneal cancers. Having family members with these cancers may be  associated with an increased risk for harmful changes (mutations) in the breast cancer genes BRCA1 and BRCA2. Results of the assessment will determine the need for genetic counseling and BRCA1 and BRCA2 testing.  Routine pelvic exams to screen for cancer are no longer recommended for nonpregnant women who are considered low risk for cancer of the pelvic organs (ovaries, uterus, and vagina) and who do not have symptoms. Ask your health care provider if a screening pelvic exam is right for you.  If you have had past treatment for cervical cancer or a condition that could lead to cancer, you need Pap tests and screening for cancer for at least 20 years after your treatment. If Pap tests have been discontinued, your risk factors (such as having a new sexual partner) need to be reassessed to determine if screening should be resumed. Some women have medical problems that increase the chance of getting cervical cancer. In these cases, your health care provider may recommend more frequent screening and Pap tests.  The HPV test is an additional test that may  be used for cervical cancer screening. The HPV test looks for the virus that can cause the cell changes on the cervix. The cells collected during the Pap test can be tested for HPV. The HPV test could be used to screen women aged 77 years and older, and should be used in women of any age who have unclear Pap test results. After the age of 6, women should have HPV testing at the same frequency as a Pap test.  Colorectal cancer can be detected and often prevented. Most routine colorectal cancer screening begins at the age of 64 years and continues through age 32 years. However, your health care provider may recommend screening at an earlier age if you have risk factors for colon cancer. On a yearly basis, your health care provider may provide home test kits to check for hidden blood in the stool. Use of a small camera at the end of a tube, to directly examine the  colon (sigmoidoscopy or colonoscopy), can detect the earliest forms of colorectal cancer. Talk to your health care provider about this at age 15, when routine screening begins. Direct exam of the colon should be repeated every 5-10 years through age 12 years, unless early forms of pre-cancerous polyps or small growths are found.  People who are at an increased risk for hepatitis B should be screened for this virus. You are considered at high risk for hepatitis B if:  You were born in a country where hepatitis B occurs often. Talk with your health care provider about which countries are considered high risk.  Your parents were born in a high-risk country and you have not received a shot to protect against hepatitis B (hepatitis B vaccine).  You have HIV or AIDS.  You use needles to inject street drugs.  You live with, or have sex with, someone who has hepatitis B.  You get hemodialysis treatment.  You take certain medicines for conditions like cancer, organ transplantation, and autoimmune conditions.  Hepatitis C blood testing is recommended for all people born from [birth year] through 1965 and any individual with known risks for hepatitis C.  Practice safe sex. Use condoms and avoid high-risk sexual practices to reduce the spread of sexually transmitted infections (STIs). STIs include gonorrhea, chlamydia, syphilis, trichomonas, herpes, HPV, and human immunodeficiency virus (HIV). Herpes, HIV, and HPV are viral illnesses that have no cure. They can result in disability, cancer, and death.  You should be screened for sexually transmitted illnesses (STIs) including gonorrhea and chlamydia if:  You are sexually active and are younger than 24 years.  You are older than 24 years and your health care provider tells you that you are at risk for this type of infection.  Your sexual activity has changed since you were last screened and you are at an increased risk for chlamydia or gonorrhea. Ask your  health care provider if you are at risk.  If you are at risk of being infected with HIV, it is recommended that you take a prescription medicine daily to prevent HIV infection. This is called preexposure prophylaxis (PrEP). You are considered at risk if:  You are a heterosexual woman, are sexually active, and are at increased risk for HIV infection.  You take drugs by injection.  You are sexually active with a partner who has HIV.  Talk with your health care provider about whether you are at high risk of being infected with HIV. If you choose to begin PrEP, you should first be tested  for HIV. You should then be tested every 3 months for as long as you are taking PrEP.  Osteoporosis is a disease in which the bones lose minerals and strength with aging. This can result in serious bone fractures or breaks. The risk of osteoporosis can be identified using a bone density scan. Women ages 60 years and over and women at risk for fractures or osteoporosis should discuss screening with their health care providers. Ask your health care provider whether you should take a calcium supplement or vitamin D to reduce the rate of osteoporosis.  Menopause can be associated with physical symptoms and risks. Hormone replacement therapy is available to decrease symptoms and risks. You should talk to your health care provider about whether hormone replacement therapy is right for you.  Use sunscreen. Apply sunscreen liberally and repeatedly throughout the day. You should seek shade when your shadow is shorter than you. Protect yourself by wearing long sleeves, pants, a wide-brimmed hat, and sunglasses year round, whenever you are outdoors.  Once a month, do a whole body skin exam, using a mirror to look at the skin on your back. Tell your health care provider of new moles, moles that have irregular borders, moles that are larger than a pencil eraser, or moles that have changed in shape or color.  Stay current with  required vaccines (immunizations).  Influenza vaccine. All adults should be immunized every year.  Tetanus, diphtheria, and acellular pertussis (Td, Tdap) vaccine. Pregnant women should receive 1 dose of Tdap vaccine during each pregnancy. The dose should be obtained regardless of the length of time since the last dose. Immunization is preferred during the 27th-36th week of gestation. An adult who has not previously received Tdap or who does not know her vaccine status should receive 1 dose of Tdap. This initial dose should be followed by tetanus and diphtheria toxoids (Td) booster doses every 10 years. Adults with an unknown or incomplete history of completing a 3-dose immunization series with Td-containing vaccines should begin or complete a primary immunization series including a Tdap dose. Adults should receive a Td booster every 10 years.  Varicella vaccine. An adult without evidence of immunity to varicella should receive 2 doses or a second dose if she has previously received 1 dose. Pregnant females who do not have evidence of immunity should receive the first dose after pregnancy. This first dose should be obtained before leaving the health care facility. The second dose should be obtained 4-8 weeks after the first dose.  Human papillomavirus (HPV) vaccine. Females aged 13-26 years who have not received the vaccine previously should obtain the 3-dose series. The vaccine is not recommended for use in pregnant females. However, pregnancy testing is not needed before receiving a dose. If a female is found to be pregnant after receiving a dose, no treatment is needed. In that case, the remaining doses should be delayed until after the pregnancy. Immunization is recommended for any person with an immunocompromised condition through the age of 3 years if she did not get any or all doses earlier. During the 3-dose series, the second dose should be obtained 4-8 weeks after the first dose. The third dose  should be obtained 24 weeks after the first dose and 16 weeks after the second dose.  Zoster vaccine. One dose is recommended for adults aged 61 years or older unless certain conditions are present.  Measles, mumps, and rubella (MMR) vaccine. Adults born before [birth year] generally are considered immune to measles and mumps.  Adults born in [birth year] or later should have 1 or more doses of MMR vaccine unless there is a contraindication to the vaccine or there is laboratory evidence of immunity to each of the three diseases. A routine second dose of MMR vaccine should be obtained at least 28 days after the first dose for students attending postsecondary schools, health care workers, or international travelers. People who received inactivated measles vaccine or an unknown type of measles vaccine during 1963-1967 should receive 2 doses of MMR vaccine. People who received inactivated mumps vaccine or an unknown type of mumps vaccine before 1979 and are at high risk for mumps infection should consider immunization with 2 doses of MMR vaccine. For females of childbearing age, rubella immunity should be determined. If there is no evidence of immunity, females who are not pregnant should be vaccinated. If there is no evidence of immunity, females who are pregnant should delay immunization until after pregnancy. Unvaccinated health care workers born before [birth year] who lack laboratory evidence of measles, mumps, or rubella immunity or laboratory confirmation of disease should consider measles and mumps immunization with 2 doses of MMR vaccine or rubella immunization with 1 dose of MMR vaccine.  Pneumococcal 13-valent conjugate (PCV13) vaccine. When indicated, a person who is uncertain of her immunization history and has no record of immunization should receive the PCV13 vaccine. An adult aged 81 years or older who has certain medical conditions and has not been previously immunized should receive 1 dose of PCV13 vaccine. This PCV13  should be followed with a dose of pneumococcal polysaccharide (PPSV23) vaccine. The PPSV23 vaccine dose should be obtained at least 8 weeks after the dose of PCV13 vaccine. An adult aged 79 years or older who has certain medical conditions and previously received 1 or more doses of PPSV23 vaccine should receive 1 dose of PCV13. The PCV13 vaccine dose should be obtained 1 or more years after the last PPSV23 vaccine dose.  Pneumococcal polysaccharide (PPSV23) vaccine. When PCV13 is also indicated, PCV13 should be obtained first. All adults aged 19 years and older should be immunized. An adult younger than age 58 years who has certain medical conditions should be immunized. Any person who resides in a nursing home or long-term care facility should be immunized. An adult smoker should be immunized. People with an immunocompromised condition and certain other conditions should receive both PCV13 and PPSV23 vaccines. People with human immunodeficiency virus (HIV) infection should be immunized as soon as possible after diagnosis. Immunization during chemotherapy or radiation therapy should be avoided. Routine use of PPSV23 vaccine is not recommended for American Indians, [place] Natives, or people younger than 65 years unless there are medical conditions that require PPSV23 vaccine. When indicated, people who have unknown immunization and have no record of immunization should receive PPSV23 vaccine. One-time revaccination 5 years after the first dose of PPSV23 is recommended for people aged 19-64 years who have chronic kidney failure, nephrotic syndrome, asplenia, or immunocompromised conditions. People who received 1-2 doses of PPSV23 before age 3 years should receive another dose of PPSV23 vaccine at age 61 years or later if at least 5 years have passed since the previous dose. Doses of PPSV23 are not needed for people immunized with PPSV23 at or after age 53 years.  Meningococcal vaccine. Adults with asplenia or  persistent complement component deficiencies should receive 2 doses of quadrivalent meningococcal conjugate (MenACWY-D) vaccine. The doses should be obtained at least 2 months apart. Microbiologists working with certain meningococcal bacteria, [employer] recruits,  people at risk during an outbreak, and people who travel to or live in countries with a high rate of meningitis should be immunized. A first-year college student up through age 63 years who is living in a residence hall should receive a dose if she did not receive a dose on or after her 16th birthday. Adults who have certain high-risk conditions should receive one or more doses of vaccine.  Hepatitis A vaccine. Adults who wish to be protected from this disease, have certain high-risk conditions, work with hepatitis A-infected animals, work in hepatitis A research labs, or travel to or work in countries with a high rate of hepatitis A should be immunized. Adults who were previously unvaccinated and who anticipate close contact with an international adoptee during the first 60 days after arrival in the [place] from a country with a high rate of hepatitis A should be immunized.  Hepatitis B vaccine. Adults who wish to be protected from this disease, have certain high-risk conditions, may be exposed to blood or other infectious body fluids, are household contacts or sex partners of hepatitis B positive people, are clients or workers in certain care facilities, or travel to or work in countries with a high rate of hepatitis B should be immunized.  Haemophilus influenzae type b (Hib) vaccine. A previously unvaccinated person with asplenia or sickle cell disease or having a scheduled splenectomy should receive 1 dose of Hib vaccine. Regardless of previous immunization, a recipient of a hematopoietic stem cell transplant should receive a 3-dose series 6-12 months after her successful transplant. Hib vaccine is not recommended for adults with HIV  infection. Preventive Services / Frequency Ages 107 to 70 years  Blood pressure check.** / Every 1 to 2 years.  Lipid and cholesterol check.** / Every 5 years beginning at age 22.  Clinical breast exam.** / Every 3 years for women in their 34s and 16s.  BRCA-related cancer risk assessment.** / For women who have family members with a BRCA-related cancer (breast, ovarian, tubal, or peritoneal cancers).  Pap test.** / Every 2 years from ages 39 through 52. Every 3 years starting at age 15 through age 30 or 28 with a history of 3 consecutive normal Pap tests.  HPV screening.** / Every 3 years from ages 62 through ages 44 to 48 with a history of 3 consecutive normal Pap tests.  Hepatitis C blood test.** / For any individual with known risks for hepatitis C.  Skin self-exam. / Monthly.  Influenza vaccine. / Every year.  Tetanus, diphtheria, and acellular pertussis (Tdap, Td) vaccine.** / Consult your health care provider. Pregnant women should receive 1 dose of Tdap vaccine during each pregnancy. 1 dose of Td every 10 years.  Varicella vaccine.** / Consult your health care provider. Pregnant females who do not have evidence of immunity should receive the first dose after pregnancy.  HPV vaccine. / 3 doses over 6 months, if 86 and younger. The vaccine is not recommended for use in pregnant females. However, pregnancy testing is not needed before receiving a dose.  Measles, mumps, rubella (MMR) vaccine.** / You need at least 1 dose of MMR if you were born in 1957 or later. You may also need a 2nd dose. For females of childbearing age, rubella immunity should be determined. If there is no evidence of immunity, females who are not pregnant should be vaccinated. If there is no evidence of immunity, females who are pregnant should delay immunization until after pregnancy.  Pneumococcal 13-valent conjugate (  PCV13) vaccine.** / Consult your health care provider.  Pneumococcal polysaccharide  (PPSV23) vaccine.** / 1 to 2 doses if you smoke cigarettes or if you have certain conditions.  Meningococcal vaccine.** / 1 dose if you are age 56 to 42 years and a Market researcher living in a residence hall, or have one of several medical conditions, you need to get vaccinated against meningococcal disease. You may also need additional booster doses.  Hepatitis A vaccine.** / Consult your health care provider.  Hepatitis B vaccine.** / Consult your health care provider.  Haemophilus influenzae type b (Hib) vaccine.** / Consult your health care provider. Ages 63 to 22 years  Blood pressure check.** / Every 1 to 2 years.  Lipid and cholesterol check.** / Every 5 years beginning at age 22 years.  Lung cancer screening. / Every year if you are aged 49-80 years and have a 30-pack-year history of smoking and currently smoke or have quit within the past 15 years. Yearly screening is stopped once you have quit smoking for at least 15 years or develop a health problem that would prevent you from having lung cancer treatment.  Clinical breast exam.** / Every year after age 55 years.  BRCA-related cancer risk assessment.** / For women who have family members with a BRCA-related cancer (breast, ovarian, tubal, or peritoneal cancers).  Mammogram.** / Every year beginning at age 29 years and continuing for as long as you are in good health. Consult with your health care provider.  Pap test.** / Every 3 years starting at age 60 years through age 65 or 43 years with a history of 3 consecutive normal Pap tests.  HPV screening.** / Every 3 years from ages 55 years through ages 68 to 79 years with a history of 3 consecutive normal Pap tests.  Fecal occult blood test (FOBT) of stool. / Every year beginning at age 63 years and continuing until age 33 years. You may not need to do this test if you get a colonoscopy every 10 years.  Flexible sigmoidoscopy or colonoscopy.** / Every 5 years for a  flexible sigmoidoscopy or every 10 years for a colonoscopy beginning at age 59 years and continuing until age 51 years.  Hepatitis C blood test.** / For all people born from [birth year] through 1965 and any individual with known risks for hepatitis C.  Skin self-exam. / Monthly.  Influenza vaccine. / Every year.  Tetanus, diphtheria, and acellular pertussis (Tdap/Td) vaccine.** / Consult your health care provider. Pregnant women should receive 1 dose of Tdap vaccine during each pregnancy. 1 dose of Td every 10 years.  Varicella vaccine.** / Consult your health care provider. Pregnant females who do not have evidence of immunity should receive the first dose after pregnancy.  Zoster vaccine.** / 1 dose for adults aged 57 years or older.  Measles, mumps, rubella (MMR) vaccine.** / You need at least 1 dose of MMR if you were born in 1957 or later. You may also need a 2nd dose. For females of childbearing age, rubella immunity should be determined. If there is no evidence of immunity, females who are not pregnant should be vaccinated. If there is no evidence of immunity, females who are pregnant should delay immunization until after pregnancy.  Pneumococcal 13-valent conjugate (PCV13) vaccine.** / Consult your health care provider.  Pneumococcal polysaccharide (PPSV23) vaccine.** / 1 to 2 doses if you smoke cigarettes or if you have certain conditions.  Meningococcal vaccine.** / Consult your health care provider.  Hepatitis A  vaccine.** / Consult your health care provider.  Hepatitis B vaccine.** / Consult your health care provider.  Haemophilus influenzae type b (Hib) vaccine.** / Consult your health care provider. Ages 54 years and over  Blood pressure check.** / Every 1 to 2 years.  Lipid and cholesterol check.** / Every 5 years beginning at age 31 years.  Lung cancer screening. / Every year if you are aged 19-80 years and have a 30-pack-year history of smoking and currently smoke or have  quit within the past 15 years. Yearly screening is stopped once you have quit smoking for at least 15 years or develop a health problem that would prevent you from having lung cancer treatment.  Clinical breast exam.** / Every year after age 86 years.  BRCA-related cancer risk assessment.** / For women who have family members with a BRCA-related cancer (breast, ovarian, tubal, or peritoneal cancers).  Mammogram.** / Every year beginning at age 38 years and continuing for as long as you are in good health. Consult with your health care provider.  Pap test.** / Every 3 years starting at age 26 years through age 20 or 62 years with 3 consecutive normal Pap tests. Testing can be stopped between 65 and 70 years with 3 consecutive normal Pap tests and no abnormal Pap or HPV tests in the past 10 years.  HPV screening.** / Every 3 years from ages 52 years through ages 70 or 78 years with a history of 3 consecutive normal Pap tests. Testing can be stopped between 65 and 70 years with 3 consecutive normal Pap tests and no abnormal Pap or HPV tests in the past 10 years.  Fecal occult blood test (FOBT) of stool. / Every year beginning at age 81 years and continuing until age 69 years. You may not need to do this test if you get a colonoscopy every 10 years.  Flexible sigmoidoscopy or colonoscopy.** / Every 5 years for a flexible sigmoidoscopy or every 10 years for a colonoscopy beginning at age 35 years and continuing until age 60 years.  Hepatitis C blood test.** / For all people born from [birth year] through 1965 and any individual with known risks for hepatitis C.  Osteoporosis screening.** / A one-time screening for women ages 60 years and over and women at risk for fractures or osteoporosis.  Skin self-exam. / Monthly.  Influenza vaccine. / Every year.  Tetanus, diphtheria, and acellular pertussis (Tdap/Td) vaccine.** / 1 dose of Td every 10 years.  Varicella vaccine.** / Consult your health care  provider.  Zoster vaccine.** / 1 dose for adults aged 53 years or older.  Pneumococcal 13-valent conjugate (PCV13) vaccine.** / Consult your health care provider.  Pneumococcal polysaccharide (PPSV23) vaccine.** / 1 dose for all adults aged 68 years and older.  Meningococcal vaccine.** / Consult your health care provider.  Hepatitis A vaccine.** / Consult your health care provider.  Hepatitis B vaccine.** / Consult your health care provider.  Haemophilus influenzae type b (Hib) vaccine.** / Consult your health care provider. ** Family history and personal history of risk and conditions may change your health care provider's recommendations. Document Released: 07/27/2001 Document Revised: 10/15/2013 Document Reviewed: 10/26/2010 [facility] Patient Information 2015 [place], [place]. This information is not intended to replace advice given to you by your health care provider. Make sure you discuss any questions you have with your health care provider.

## 2014-12-31 NOTE — Assessment & Plan Note (Signed)
TSH within normal limits. Will continue current regimen. Follow-up 6 months.

## 2014-12-31 NOTE — Progress Notes (Signed)
Patient presents to clinic today for annual exam.  Patient is had fating labs last week. Will review with patient today. Patient with history of hyperlipidemia, previously well controlled with lipitor daily. Endorses taking as directed w/o myalgias. Continue her levothyroxine as directed without side effect.  Health Maintenance: Dental -- up-to-date Vision -- up-to-date Immunizations -- Tetanus up-to-date. Colonoscopy -- Up-to-date Mammogram -- Due in September 2016. Gets with OB/GYN. Bone Density --Followed by OB/GYN who is monitoring this.  Past Medical History  Diagnosis Date  . Acute asthmatic bronchitis   . Hypercholesterolemia   . Hypothyroidism   . Interstitial cystitis   . Fibromyalgia   . Anxiety     Past Surgical History  Procedure Laterality Date  . Total abdominal hysterectomy  1997  . Left heart catheterization with coronary angiogram Bilateral 03/13/2014    Procedure: LEFT HEART CATHETERIZATION WITH CORONARY ANGIOGRAM;  Surgeon: Blane Ohara, MD;  Location: [facility] CATH LAB;  Service: Cardiovascular;  Laterality: Bilateral;    Current Outpatient Prescriptions on File Prior to Visit  Medication Sig Dispense Refill  . aspirin 81 MG tablet Take 81 mg by mouth daily.      Marland Kitchen atorvastatin (LIPITOR) 20 MG tablet Take 0.5 tablets (10 mg total) by mouth daily. 90 tablet 1  . Cholecalciferol (VITAMIN D) 1000 UNITS capsule Take 1,000 Units by mouth daily.      . Coconut Oil 1000 MG CAPS Take 1,000 mg by mouth daily.     . Coenzyme Q10 (COQ10) 100 MG CAPS Take 1 capsule by mouth daily.     . cyclobenzaprine (FLEXERIL) 10 MG tablet Take 1 tablet (10 mg total) by mouth daily. 90 tablet 1  . estradiol (ESTRACE) 0.5 MG tablet Take 0.25 mg by mouth daily.     . NON FORMULARY Take 3 tablets by mouth daily. Bone-Up: Calcium Supplement    . vitamin C (ASCORBIC ACID) 500 MG tablet Take 1,000 mg by mouth daily.     . vitamin E 400 UNIT capsule Take 400 Units by mouth daily.        No current facility-administered medications on file prior to visit.    Allergies  Allergen Reactions  . Iodine Anaphylaxis  . Anesthetics, Amide Nausea And Vomiting  . Sulfonamide Derivatives     REACTION: throat swelling and itching  . Valtrex [Valacyclovir Hcl]     "made me deathly sick"    Family History  Problem Relation Age of Onset  . Alzheimer's disease Father   . Stroke Father   . Heart failure Father   . Other Mother     bronchiectasis  . Pneumonia Mother   . Heart failure Mother   . Pancreatic cancer Brother   . Hyperlipidemia Sister   . Other Sister     bronchiectasis    History   Social History  . Marital Status: Divorced    Spouse Name: N/A  . Number of Children: 3  . Years of Education: N/A   Occupational History  . school teacher - substitute teaching now    Social History Main Topics  . Smoking status: Never Smoker   . Smokeless tobacco: Never Used  . Alcohol Use: Yes     Comment: Rare  . Drug Use: No  . Sexual Activity: Not on file   Other Topics Concern  . Not on file   Social History Narrative   Plays piano, 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   Declines flu and pneumonia shot 04-30-10  Review of Systems  Constitutional: Negative for fever and weight loss.  HENT: Negative for ear discharge, ear pain, hearing loss and tinnitus.   Eyes: Negative for blurred vision, double vision, photophobia and pain.  Respiratory: Negative for cough and shortness of breath.   Cardiovascular: Negative for chest pain and palpitations.  Gastrointestinal: Negative for heartburn, nausea, vomiting, abdominal pain, diarrhea, constipation, blood in stool and melena.  Genitourinary: Negative for dysuria, urgency, frequency, hematuria and flank pain.  Musculoskeletal: Negative for falls.  Neurological: Negative for dizziness, loss of consciousness and headaches.  Endo/Heme/Allergies: Negative for environmental allergies.  Psychiatric/Behavioral: Negative for depression,  suicidal ideas, hallucinations and substance abuse. The patient is not nervous/anxious and does not have insomnia.     BP 110/58 mmHg  Pulse 84  Temp(Src) 98 F (36.7 C) (Oral)  Ht 5\' 3"  (1.6 m)  Wt 121 lb 6.4 oz (55.067 kg)  BMI 21.51 kg/m2  SpO2 100%  Physical Exam  Constitutional: She is oriented to person, place, and time and well-developed, well-nourished, and in no distress.  HENT:  Head: Normocephalic and atraumatic.  Right Ear: Tympanic membrane, external ear and ear canal normal.  Left Ear: Tympanic membrane, external ear and ear canal normal.  Nose: Nose normal. No mucosal edema.  Mouth/Throat: Uvula is midline, oropharynx is clear and moist and mucous membranes are normal. No oropharyngeal exudate or posterior oropharyngeal erythema.  Eyes: Conjunctivae are normal. Pupils are equal, round, and reactive to light.  Neck: Neck supple. No thyromegaly present.  Cardiovascular: Normal rate, regular rhythm, normal heart sounds and intact distal pulses.   Pulmonary/Chest: Effort normal and breath sounds normal. No respiratory distress. She has no wheezes. She has no rales.  Abdominal: Soft. Bowel sounds are normal. She exhibits no distension and no mass. There is no tenderness. There is no rebound and no guarding.  Lymphadenopathy:    She has no cervical adenopathy.  Neurological: She is alert and oriented to person, place, and time. No cranial nerve deficit.  Skin: Skin is warm and dry. No rash noted.  Psychiatric: Affect normal.  Vitals reviewed.   Recent Results (from the past 2160 hour(s))  CBC     Status: None   Collection Time: 12/24/14  8:09 AM  Result Value Ref Range   WBC 4.8 4.0 - 10.5 K/uL   RBC 4.24 3.87 - 5.11 Mil/uL   Platelets 306.0 150.0 - 400.0 K/uL   Hemoglobin 13.1 12.0 - 15.0 g/dL   HCT 39.4 36.0 - 46.0 %   MCV 93.1 78.0 - 100.0 fl   MCHC 33.3 30.0 - 36.0 g/dL   RDW 13.3 11.5 - 85.0 %  Basic Metabolic Panel (BMET)     Status: Abnormal   Collection  Time: 12/24/14  8:09 AM  Result Value Ref Range   Sodium 142 135 - 145 mEq/L   Potassium 3.9 3.5 - 5.1 mEq/L   Chloride 104 96 - 112 mEq/L   CO2 33 (H) 19 - 32 mEq/L   Glucose, Bld 88 70 - 99 mg/dL   BUN 16 6 - 23 mg/dL   Creatinine, Ser 0.80 0.40 - 1.20 mg/dL   Calcium 9.3 8.4 - 10.5 mg/dL   GFR 76.65 >60.00 mL/min  Hepatic function panel     Status: None   Collection Time: 12/24/14  8:09 AM  Result Value Ref Range   Total Bilirubin 0.5 0.2 - 1.2 mg/dL   Bilirubin, Direct 0.1 0.0 - 0.3 mg/dL   Alkaline Phosphatase 89 39 -  117 U/L   AST 25 0 - 37 U/L   ALT 26 0 - 35 U/L   Total Protein 6.9 6.0 - 8.3 g/dL   Albumin 4.0 3.5 - 5.2 g/dL  Urinalysis, Routine w reflex microscopic     Status: None   Collection Time: 12/24/14  8:09 AM  Result Value Ref Range   Color, Urine YELLOW Yellow;Lt. Yellow   APPearance CLEAR Clear   Specific Gravity, Urine 1.010 1.000-1.030   pH 7.0 5.0 - 8.0   Total Protein, Urine NEGATIVE Negative   Urine Glucose NEGATIVE Negative   Ketones, ur NEGATIVE Negative   Bilirubin Urine NEGATIVE Negative   Hgb urine dipstick NEGATIVE Negative   Urobilinogen, UA 0.2 0.0 - 1.0   Leukocytes, UA NEGATIVE Negative   Nitrite NEGATIVE Negative   WBC, UA none seen 0-2/hpf   RBC / HPF none seen 0-2/hpf   Squamous Epithelial / LPF Rare(0-4/hpf) Rare(0-4/hpf)  TSH     Status: None   Collection Time: 12/24/14  8:09 AM  Result Value Ref Range   TSH 2.53 0.35 - 4.50 uIU/mL  Lipid Profile     Status: None   Collection Time: 12/24/14  8:09 AM  Result Value Ref Range   Cholesterol 154 0 - 200 mg/dL    Comment: ATP III Classification       Desirable:  < 200 mg/dL               Borderline High:  200 - 239 mg/dL          High:  > = 240 mg/dL   Triglycerides 113.0 0.0 - 149.0 mg/dL    Comment: Normal:  <150 mg/dLBorderline High:  150 - 199 mg/dL   HDL 51.20 >39.00 mg/dL   VLDL 22.6 0.0 - 40.0 mg/dL   LDL Cholesterol 80 0 - 99 mg/dL   Total CHOL/HDL Ratio 3     Comment:                 Men          Women1/2 Average Risk     3.4          3.3Average Risk          5.0          4.42X Average Risk          9.6          7.13X Average Risk          15.0          11.0                       NonHDL 102.80     Comment: NOTE:  Non-HDL goal should be 30 mg/dL higher than patient's LDL goal (i.e. LDL goal of < 70 mg/dL, would have non-HDL goal of < 100 mg/dL)  Vitamin D (25 hydroxy)     Status: None   Collection Time: 12/24/14  8:09 AM  Result Value Ref Range   VITD 44.94 30.00 - 100.00 ng/mL    Assessment/Plan: Visit for preventive health examination Depression screen negative. Health Maintenance reviewed. Declines Zostavax. Other immunizations are up-to-date. Mammogram and DEXA up-to-date (PAtient followed by OB/GYN). Preventive schedule reviewed and handout given in AVS.  Recent labs reviewed with patient.  Eustachian tube disorder Resistant. Referral to ENT placed.  HYPERCHOLESTEROLEMIA Stable. Continue statin therapy. Medications refilled.  Hypothyroidism TSH within normal limits. Will continue current regimen. Follow-up 6 months.

## 2014-12-31 NOTE — Assessment & Plan Note (Signed)
Depression screen negative. Health Maintenance reviewed. Declines Zostavax. Other immunizations are up-to-date. Mammogram and DEXA up-to-date (PAtient followed by OB/GYN). Preventive schedule reviewed and handout given in AVS.  Recent labs reviewed with patient.

## 2014-12-31 NOTE — Assessment & Plan Note (Signed)
Stable. Continue statin therapy. Medications refilled.

## 2014-12-31 NOTE — Assessment & Plan Note (Signed)
Resistant. Referral to ENT placed.

## 2015-01-06 ENCOUNTER — Telehealth: Payer: Self-pay | Admitting: Physician Assistant

## 2015-01-06 DIAGNOSIS — H6993 Unspecified Eustachian tube disorder, bilateral: Secondary | ICD-10-CM

## 2015-01-06 DIAGNOSIS — H6983 Other specified disorders of Eustachian tube, bilateral: Secondary | ICD-10-CM

## 2015-01-06 NOTE — Telephone Encounter (Signed)
Caller name: Venita Lick Relationship to patient: self  Can be reached: home [phone], cell [phone]  Reason for call: pt states she was to be referred to ENT and has not heard anything. No referral in. Please follow up.

## 2015-01-06 NOTE — Telephone Encounter (Signed)
Handled. She should hear something this week.

## 2015-03-12 ENCOUNTER — Other Ambulatory Visit: Payer: Self-pay

## 2015-03-24 ENCOUNTER — Ambulatory Visit (INDEPENDENT_AMBULATORY_CARE_PROVIDER_SITE_OTHER): Payer: BLUE CROSS/BLUE SHIELD | Admitting: Family Medicine

## 2015-03-24 ENCOUNTER — Encounter: Payer: Self-pay | Admitting: Family Medicine

## 2015-03-24 VITALS — BP 118/70 | HR 78 | Temp 98.2°F | Wt 120.8 lb

## 2015-03-24 DIAGNOSIS — J069 Acute upper respiratory infection, unspecified: Secondary | ICD-10-CM | POA: Diagnosis not present

## 2015-03-24 MED ORDER — LORATADINE 10 MG PO TABS: 10.0000 mg | ORAL_TABLET | Freq: Every day | ORAL | Status: DC

## 2015-03-24 MED ORDER — AMOXICILLIN 875 MG PO TABS: 875.0000 mg | ORAL_TABLET | Freq: Two times a day (BID) | ORAL | Status: DC

## 2015-03-24 NOTE — Assessment & Plan Note (Signed)
Claritin, flonase If symptoms worsen --- rx abx given to take if symptoms last more than 10days - 2 weeks

## 2015-03-24 NOTE — Patient Instructions (Addendum)
Upper Respiratory Infection, Adult Most upper respiratory infections (URIs) are a viral infection of the air passages leading to the lungs. A URI affects the nose, throat, and upper air passages. The most common type of URI is nasopharyngitis and is typically referred to as "the common cold." URIs run their course and usually go away on their own. Most of the time, a URI does not require medical attention, but sometimes a bacterial infection in the upper airways can follow a viral infection. This is called a secondary infection. Sinus and middle ear infections are common types of secondary upper respiratory infections. Bacterial pneumonia can also complicate a URI. A URI can worsen asthma and chronic obstructive pulmonary disease (COPD). Sometimes, these complications can require emergency medical care and may be life threatening.  CAUSES Almost all URIs are caused by viruses. A virus is a type of germ and can spread from one person to another.  RISKS FACTORS You may be at risk for a URI if:   You smoke.   You have chronic heart or lung disease.  You have a weakened defense (immune) system.   You are very young or very old.   You have nasal allergies or asthma.  You work in crowded or poorly ventilated areas.  You work in health care facilities or schools. SIGNS AND SYMPTOMS  Symptoms typically develop 2-3 days after you come in contact with a cold virus. Most viral URIs last 7-10 days. However, viral URIs from the influenza virus (flu virus) can last 14-18 days and are typically more severe. Symptoms may include:   Runny or stuffy (congested) nose.   Sneezing.   Cough.   Sore throat.   Headache.   Fatigue.   Fever.   Loss of appetite.   Pain in your forehead, behind your eyes, and over your cheekbones (sinus pain).  Muscle aches.  DIAGNOSIS  Your health care provider may diagnose a URI by:  Physical exam.  Tests to check that your symptoms are not due to  another condition such as:  Strep throat.  Sinusitis.  Pneumonia.  Asthma. TREATMENT  A URI goes away on its own with time. It cannot be cured with medicines, but medicines may be prescribed or recommended to relieve symptoms. Medicines may help:  Reduce your fever.  Reduce your cough.  Relieve nasal congestion. HOME CARE INSTRUCTIONS   Take medicines only as directed by your health care provider.   Gargle warm saltwater or take cough drops to comfort your throat as directed by your health care provider.  Use a warm mist humidifier or inhale steam from a shower to increase air moisture. This may make it easier to breathe.  Drink enough fluid to keep your urine clear or pale yellow.   Eat soups and other clear broths and maintain good nutrition.   Rest as needed.   Return to work when your temperature has returned to normal or as your health care provider advises. You may need to stay home longer to avoid infecting others. You can also use a face mask and careful hand washing to prevent spread of the virus.  Increase the usage of your inhaler if you have asthma.   Do not use any tobacco products, including cigarettes, chewing tobacco, or electronic cigarettes. If you need help quitting, ask your health care provider. PREVENTION  The best way to protect yourself from getting a cold is to practice good hygiene.   Avoid oral or hand contact with people with cold   symptoms.   Wash your hands often if contact occurs.  There is no clear evidence that vitamin C, vitamin E, echinacea, or exercise reduces the chance of developing a cold. However, it is always recommended to get plenty of rest, exercise, and practice good nutrition.  SEEK MEDICAL CARE IF:   You are getting worse rather than better.   Your symptoms are not controlled by medicine.   You have chills.  You have worsening shortness of breath.  You have brown or red mucus.  You have yellow or brown nasal  discharge.  You have pain in your face, especially when you bend forward.  You have a fever.  You have swollen neck glands.  You have pain while swallowing.  You have white areas in the back of your throat. SEEK IMMEDIATE MEDICAL CARE IF:   You have severe or persistent:  Headache.  Ear pain.  Sinus pain.  Chest pain.  You have chronic lung disease and any of the following:  Wheezing.  Prolonged cough.  Coughing up blood.  A change in your usual mucus.  You have a stiff neck.  You have changes in your:  Vision.  Hearing.  Thinking.  Mood. MAKE SURE YOU:   Understand these instructions.  Will watch your condition.  Will get help right away if you are not doing well or get worse.   This information is not intended to replace advice given to you by your health care provider. Make sure you discuss any questions you have with your health care provider.   Document Released: 11/24/2000 Document Revised: 10/15/2014 Document Reviewed: 09/05/2013 Elsevier Interactive Patient Education 2016 [employer]  

## 2015-03-24 NOTE — Progress Notes (Signed)
Patient ID: RANDI POULLARD, female    DOB: 04/11/1951  Age: 64 y.o. MRN: [id number]    Subjective:  Subjective HPI VANICE RAPPA presents for c/o sore throat that started Saturday.  Some nasal congestion.  Pt has only taken tylenol.    Review of Systems  Constitutional: Negative for fever and chills.  HENT: Positive for congestion, postnasal drip, sneezing and sore throat. Negative for rhinorrhea and sinus pressure.   Respiratory: Positive for cough. Negative for chest tightness, shortness of breath and wheezing.   Cardiovascular: Negative for chest pain, palpitations and leg swelling.  Allergic/Immunologic: Negative for environmental allergies.    History Past Medical History  Diagnosis Date  . Acute asthmatic bronchitis   . Hypercholesterolemia   . Hypothyroidism   . Interstitial cystitis   . Fibromyalgia   . Anxiety     She has past surgical history that includes Total abdominal hysterectomy (1997) and left heart catheterization with coronary angiogram (Bilateral, 03/13/2014).   Her family history includes Alzheimer's disease in her father; Heart failure in her father and mother; Hyperlipidemia in her sister; Other in her mother and sister; Pancreatic cancer in her brother; Pneumonia in her mother; Stroke in her father.She reports that she has never smoked. She has never used smokeless tobacco. She reports that she drinks alcohol. She reports that she does not use illicit drugs.  Current Outpatient Prescriptions on File Prior to Visit  Medication Sig Dispense Refill  . ALPRAZolam (XANAX) 0.5 MG tablet Take 1 tablet (0.5 mg total) by mouth 3 (three) times daily as needed for anxiety. 90 tablet 0  . aspirin 81 MG tablet Take 81 mg by mouth daily.      Marland Kitchen atorvastatin (LIPITOR) 20 MG tablet Take 0.5 tablets (10 mg total) by mouth daily. 90 tablet 1  . Cholecalciferol (VITAMIN D) 1000 UNITS capsule Take 1,000 Units by mouth daily.      . Coconut Oil 1000 MG CAPS Take 1,000 mg by  mouth daily.     . Coenzyme Q10 (COQ10) 100 MG CAPS Take 1 capsule by mouth daily.     . cyclobenzaprine (FLEXERIL) 10 MG tablet Take 1 tablet (10 mg total) by mouth daily. 90 tablet 1  . estradiol (ESTRACE) 0.5 MG tablet Take 0.25 mg by mouth daily.     Marland Kitchen levothyroxine (SYNTHROID, LEVOTHROID) 75 MCG tablet Take 1 tablet (75 mcg total) by mouth daily. 90 tablet 1  . NON FORMULARY Take 3 tablets by mouth daily. Bone-Up: Calcium Supplement    . vitamin C (ASCORBIC ACID) 500 MG tablet Take 1,000 mg by mouth daily.     . vitamin E 400 UNIT capsule Take 400 Units by mouth daily.       No current facility-administered medications on file prior to visit.     Objective:  Objective Physical Exam  Constitutional: She is oriented to person, place, and time. She appears well-developed and well-nourished.  HENT:  Right Ear: External ear normal.  Left Ear: External ear normal.  + PND + errythema  Eyes: Conjunctivae are normal. Right eye exhibits no discharge. Left eye exhibits no discharge.  Cardiovascular: Normal rate, regular rhythm and normal heart sounds.   No murmur heard. Pulmonary/Chest: Effort normal and breath sounds normal. No respiratory distress. She has no wheezes. She has no rales. She exhibits no tenderness.  Musculoskeletal: She exhibits no edema.  Lymphadenopathy:    She has no cervical adenopathy.  Neurological: She is alert and oriented to person, place, and  time.   BP 118/70 mmHg  Pulse 78  Temp(Src) 98.2 F (36.8 C) (Oral)  Wt 120 lb 12.8 oz (54.795 kg)  SpO2 98% Wt Readings from Last 3 Encounters:  03/24/15 120 lb 12.8 oz (54.795 kg)  12/31/14 121 lb 6.4 oz (55.067 kg)  11/06/14 119 lb 12.8 oz (54.341 kg)     Lab Results  Component Value Date   WBC 4.8 12/24/2014   HGB 13.1 12/24/2014   HCT 39.4 12/24/2014   PLT 306.0 12/24/2014   GLUCOSE 88 12/24/2014   CHOL 154 12/24/2014   TRIG 113.0 12/24/2014   HDL 51.20 12/24/2014   LDLDIRECT 156.8 11/26/2008    LDLCALC 80 12/24/2014   ALT 26 12/24/2014   AST 25 12/24/2014   NA 142 12/24/2014   K 3.9 12/24/2014   CL 104 12/24/2014   CREATININE 0.80 12/24/2014   BUN 16 12/24/2014   CO2 33* 12/24/2014   TSH 2.53 12/24/2014   INR 1.03 03/04/2014   HGBA1C 6.0* 12/10/2013    No results found.   Assessment & Plan:  Plan I am having Ms. Reposa start on loratadine and amoxicillin. I am also having her maintain her aspirin, CoQ10, vitamin C, Vitamin D, vitamin E, NON FORMULARY, Coconut Oil, estradiol, atorvastatin, cyclobenzaprine, ALPRAZolam, and levothyroxine.  Meds ordered this encounter  Medications  . loratadine (CLARITIN) 10 MG tablet    Sig: Take 1 tablet (10 mg total) by mouth daily.    Dispense:  30 tablet    Refill:  11  . amoxicillin (AMOXIL) 875 MG tablet    Sig: Take 1 tablet (875 mg total) by mouth 2 (two) times daily.    Dispense:  20 tablet    Refill:  0    Problem List Items Addressed This Visit    Acute upper respiratory infection - Primary    Claritin, flonase If symptoms worsen --- rx abx given to take if symptoms last more than 10days - 2 weeks      Relevant Medications   loratadine (CLARITIN) 10 MG tablet      Follow-up: Return if symptoms worsen or fail to improve.  Garnet Koyanagi, DO

## 2015-03-24 NOTE — Progress Notes (Signed)
Pre visit review using our clinic review tool, if applicable. No additional management support is needed unless otherwise documented below in the visit note. 

## 2015-04-16 ENCOUNTER — Telehealth: Payer: Self-pay | Admitting: Physician Assistant

## 2015-04-16 MED ORDER — FAMCICLOVIR 500 MG PO TABS: 500.0000 mg | ORAL_TABLET | Freq: Three times a day (TID) | ORAL | Status: DC

## 2015-04-16 NOTE — Telephone Encounter (Signed)
Caller name: Christi   Relationship to patient: Self  Can be reached: [phone]  Reason for call: pt is requesting a call back from [facility] directly.

## 2015-04-16 NOTE — Telephone Encounter (Signed)
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

## 2015-04-25 ENCOUNTER — Ambulatory Visit (INDEPENDENT_AMBULATORY_CARE_PROVIDER_SITE_OTHER): Payer: BLUE CROSS/BLUE SHIELD | Admitting: Physician Assistant

## 2015-04-25 ENCOUNTER — Encounter: Payer: Self-pay | Admitting: Physician Assistant

## 2015-04-25 VITALS — BP 116/68 | HR 81 | Temp 98.0°F | Resp 16 | Ht 63.0 in | Wt 121.2 lb

## 2015-04-25 DIAGNOSIS — M545 Low back pain, unspecified: Secondary | ICD-10-CM

## 2015-04-25 DIAGNOSIS — R399 Unspecified symptoms and signs involving the genitourinary system: Secondary | ICD-10-CM | POA: Diagnosis not present

## 2015-04-25 LAB — POCT URINALYSIS DIPSTICK
Bilirubin, UA: NEGATIVE
Blood, UA: NEGATIVE
Glucose, UA: NEGATIVE
Ketones, UA: NEGATIVE
Leukocytes, UA: NEGATIVE
Nitrite, UA: NEGATIVE
Protein, UA: NEGATIVE
Spec Grav, UA: 1.01
pH, UA: 5

## 2015-04-25 NOTE — Assessment & Plan Note (Signed)
Urine dip unremarkable. Symptoms and exam consistent with MSK etiology. Will increase Flexeril to TID over the weekend. Patient not to drive with medication. ES tylenol for pain. Topical Aspercreme. No heavy lifting or overexertion. Follow-up if not resolving.

## 2015-04-25 NOTE — Progress Notes (Signed)
Pre visit review using our clinic review tool, if applicable. No additional management support is needed unless otherwise documented below in the visit note/SLS  

## 2015-04-25 NOTE — Progress Notes (Signed)
Patient presents to clinic today c/o 1 week of low back pain described as aching and constant. Denies heavy lifting or overexertion.  Denies trauma or injury. Patient denies urinary urgency, frequency, hematuria. Denies fever, chills, nausea or vomiting. Has history of IC but states this "does not feel like that".  Past Medical History  Diagnosis Date  . Acute asthmatic bronchitis   . Hypercholesterolemia   . Hypothyroidism   . Interstitial cystitis   . Fibromyalgia   . Anxiety     Current Outpatient Prescriptions on File Prior to Visit  Medication Sig Dispense Refill  . ALPRAZolam (XANAX) 0.5 MG tablet Take 1 tablet (0.5 mg total) by mouth 3 (three) times daily as needed for anxiety. 90 tablet 0  . aspirin 81 MG tablet Take 81 mg by mouth daily.      Marland Kitchen atorvastatin (LIPITOR) 20 MG tablet Take 0.5 tablets (10 mg total) by mouth daily. 90 tablet 1  . Cholecalciferol (VITAMIN D) 1000 UNITS capsule Take 1,000 Units by mouth daily.      . Coconut Oil 1000 MG CAPS Take 1,000 mg by mouth daily.     . Coenzyme Q10 (COQ10) 100 MG CAPS Take 1 capsule by mouth daily.     . cyclobenzaprine (FLEXERIL) 10 MG tablet Take 1 tablet (10 mg total) by mouth daily. 90 tablet 1  . estradiol (ESTRACE) 0.5 MG tablet Take 0.25 mg by mouth daily.     Marland Kitchen levothyroxine (SYNTHROID, LEVOTHROID) 75 MCG tablet Take 1 tablet (75 mcg total) by mouth daily. 90 tablet 1  . NON FORMULARY Take 2 tablets by mouth daily. Bone-Up: Calcium Supplement    . vitamin C (ASCORBIC ACID) 500 MG tablet Take 1,000 mg by mouth daily.     . vitamin E 400 UNIT capsule Take 400 Units by mouth daily.       No current facility-administered medications on file prior to visit.    Allergies  Allergen Reactions  . Iodine Anaphylaxis  . Anesthetics, Amide Nausea And Vomiting  . Sulfonamide Derivatives     REACTION: throat swelling and itching  . Valtrex [Valacyclovir Hcl]     "made me deathly sick"    Family History  Problem  Relation Age of Onset  . Alzheimer's disease Father   . Stroke Father   . Heart failure Father   . Other Mother     bronchiectasis  . Pneumonia Mother   . Heart failure Mother   . Pancreatic cancer Brother   . Hyperlipidemia Sister   . Other Sister     bronchiectasis    Social History   Social History  . Marital Status: Divorced    Spouse Name: N/A  . Number of Children: 3  . Years of Education: N/A   Occupational History  . school teacher - substitute teaching now    Social History Main Topics  . Smoking status: Never Smoker   . Smokeless tobacco: Never Used  . Alcohol Use: Yes     Comment: Rare  . Drug Use: No  . Sexual Activity: Not Asked   Other Topics Concern  . None   Social History Narrative   Plays piano, 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   Declines flu and pneumonia shot 04-30-10   Review of Systems - See HPI.  All other ROS are negative.  BP 116/68 mmHg  Pulse 81  Temp(Src) 98 F (36.7 C) (Oral)  Resp 16  Ht 5\' 3"  (1.6 m)  Wt 121 lb 4  oz (54.999 kg)  BMI 21.48 kg/m2  SpO2 98%  Physical Exam  Constitutional: She is well-developed, well-nourished, and in no distress.  Cardiovascular: Normal rate, regular rhythm, normal heart sounds and intact distal pulses.   Pulmonary/Chest: Effort normal and breath sounds normal. No respiratory distress. She has no wheezes. She has no rales. She exhibits no tenderness.  Abdominal: Soft. Bowel sounds are normal. She exhibits no distension and no mass. There is no tenderness. There is no rebound, no guarding and no CVA tenderness.  Musculoskeletal:       Lumbar back: She exhibits tenderness, pain and spasm. She exhibits no bony tenderness.  Pain reproduced with palpation and range of motion  Skin: Skin is warm and dry. No rash noted.  Psychiatric: Affect normal.  Vitals reviewed.  Recent Results (from the past 2160 hour(s))  POCT urinalysis dipstick     Status: None   Collection Time: 04/25/15 12:48 PM  Result Value Ref  Range   Color, UA straw    Clarity, UA clear    Glucose, UA negative    Bilirubin, UA negative    Ketones, UA negative    Spec Grav, UA 1.010    Blood, UA negative    pH, UA 5.0    Protein, UA negative    Urobilinogen, UA     Nitrite, UA negative    Leukocytes, UA Negative Negative    Assessment/Plan: Bilateral low back pain without sciatica Urine dip unremarkable. Symptoms and exam consistent with MSK etiology. Will increase Flexeril to TID over the weekend. Patient not to drive with medication. ES tylenol for pain. Topical Aspercreme. No heavy lifting or overexertion. Follow-up if not resolving.

## 2015-04-25 NOTE — Patient Instructions (Signed)
Please continue your Flexeril as directed at night. This weekend while at home, take 1/2 Flexeril in the morning and midday if pain is still significant.  Take ES Tylenol as directed.  Apply Aspercreme to the lower back. Rest and avoid any heavy lifting this weekend.  I will call you with your results. Follow-up if symptoms are not resolving.

## 2015-04-28 LAB — CULTURE, URINE COMPREHENSIVE: Colony Count: 15000

## 2015-04-28 MED ORDER — CIPROFLOXACIN HCL 500 MG PO TABS: 500.0000 mg | ORAL_TABLET | Freq: Two times a day (BID) | ORAL | Status: DC

## 2015-04-28 NOTE — Addendum Note (Signed)
Addended by: Tasia Catchings on: 04/28/2015 04:18 PM   Modules accepted: Orders

## 2015-05-01 ENCOUNTER — Telehealth: Payer: Self-pay | Admitting: Physician Assistant

## 2015-05-01 ENCOUNTER — Other Ambulatory Visit: Payer: Self-pay

## 2015-05-01 MED ORDER — CIPROFLOXACIN HCL 500 MG PO TABS: 500.0000 mg | ORAL_TABLET | Freq: Two times a day (BID) | ORAL | Status: DC

## 2015-05-01 NOTE — Telephone Encounter (Signed)
Pt said that she is out of meds and still have UTI sx. Maybe 3 days wasn't enough.   Pharmacy: [facility], [place] - [address]

## 2015-05-01 NOTE — Telephone Encounter (Signed)
Medication sent to pharmacy per Teresita Madura. Patient notified.

## 2015-05-01 NOTE — Telephone Encounter (Signed)
Patient checking on the status of message below. Best [phone] and [phone]

## 2015-05-01 NOTE — Telephone Encounter (Signed)
Ok to repeat course of cipro 500 for 5 days. Please send in Rx and notify patient.

## 2015-05-28 ENCOUNTER — Other Ambulatory Visit: Payer: Self-pay | Admitting: Physician Assistant

## 2015-05-28 NOTE — Telephone Encounter (Signed)
Caller name: Self   Can be reached: [phone]  Pharmacy:  [facility] ([facility]) [place], [place] [phone] (Phone) [phone] (Fax)         Reason for call: Request refills on atorvastatin (LIPITOR) 20 MG tablet and cyclobenzaprine (FLEXERIL) 10 MG tablet [id number]

## 2015-05-28 NOTE — Telephone Encounter (Signed)
Requesting Cyclobenzaprine 10mg -Take 1 tablet by mouth daily. Last refill:12/10/14;#90,1 Last OV:04/25/15 Please advise.//AB/CMA

## 2015-05-29 MED ORDER — ATORVASTATIN CALCIUM 20 MG PO TABS: 10.0000 mg | ORAL_TABLET | Freq: Every day | ORAL | Status: DC

## 2015-05-29 MED ORDER — CYCLOBENZAPRINE HCL 10 MG PO TABS: 10.0000 mg | ORAL_TABLET | Freq: Every day | ORAL | Status: DC

## 2015-07-01 ENCOUNTER — Ambulatory Visit (INDEPENDENT_AMBULATORY_CARE_PROVIDER_SITE_OTHER): Payer: BLUE CROSS/BLUE SHIELD | Admitting: Physician Assistant

## 2015-07-01 ENCOUNTER — Encounter: Payer: Self-pay | Admitting: Physician Assistant

## 2015-07-01 VITALS — BP 135/56 | HR 78 | Temp 97.8°F | Ht 63.0 in | Wt 122.6 lb

## 2015-07-01 DIAGNOSIS — R399 Unspecified symptoms and signs involving the genitourinary system: Secondary | ICD-10-CM

## 2015-07-01 DIAGNOSIS — M545 Low back pain: Secondary | ICD-10-CM

## 2015-07-01 LAB — POC URINALSYSI DIPSTICK (AUTOMATED)
BILIRUBIN UA: NEGATIVE
Glucose, UA: NEGATIVE
KETONES UA: NEGATIVE
Leukocytes, UA: NEGATIVE
Nitrite, UA: NEGATIVE
PH UA: 6
PROTEIN UA: NEGATIVE
RBC UA: NEGATIVE
SPEC GRAV UA: 1.01
Urobilinogen, UA: 0.2

## 2015-07-01 MED ORDER — CIMETIDINE 200 MG PO TABS: 200.0000 mg | ORAL_TABLET | Freq: Two times a day (BID) | ORAL | Status: DC

## 2015-07-01 MED ORDER — CIPROFLOXACIN HCL 500 MG PO TABS: 500.0000 mg | ORAL_TABLET | Freq: Two times a day (BID) | ORAL | Status: DC

## 2015-07-01 MED ORDER — LEVOTHYROXINE SODIUM 75 MCG PO TABS: 75.0000 ug | ORAL_TABLET | Freq: Every day | ORAL | Status: DC

## 2015-07-01 NOTE — Patient Instructions (Signed)
Your symptoms are consistent with a bladder infection, also called acute cystitis.  There is a chance that your interstitial cystitis has flared. Please take your antibiotic (Cipro) as directed until all pills are gone. Take the Tagement as directed.  Stay very well hydrated.  Consider a daily probiotic (Align, Culturelle, or Activia) to help prevent stomach upset caused by the antibiotic.  Taking a probiotic daily may also help prevent recurrent UTIs.  Also consider taking AZO (Phenazopyridine) tablets to help decrease pain with urination.  I will call you with your urine testing results.  We will change antibiotics if indicated.  Call or return to clinic if symptoms are not resolved by completion of antibiotic.   Urinary Tract Infection A urinary tract infection (UTI) can occur any place along the urinary tract. The tract includes the kidneys, ureters, bladder, and urethra. A type of germ called bacteria often causes a UTI. UTIs are often helped with antibiotic medicine.  HOME CARE   If given, take antibiotics as told by your doctor. Finish them even if you start to feel better.  Drink enough fluids to keep your pee (urine) clear or pale yellow.  Avoid tea, drinks with caffeine, and bubbly (carbonated) drinks.  Pee often. Avoid holding your pee in for a long time.  Pee before and after having sex (intercourse).  Wipe from front to back after you poop (bowel movement) if you are a woman. Use each tissue only once. GET HELP RIGHT AWAY IF:   You have back pain.  You have lower belly (abdominal) pain.  You have chills.  You feel sick to your stomach (nauseous).  You throw up (vomit).  Your burning or discomfort with peeing does not go away.  You have a fever.  Your symptoms are not better in 3 days. MAKE SURE YOU:   Understand these instructions.  Will watch your condition.  Will get help right away if you are not doing well or get worse. Document Released: 11/17/2007 Document  Revised: 02/23/2012 Document Reviewed: 12/30/2011 [facility] Patient Information 2015 [place], [place]. This information is not intended to replace advice given to you by your health care provider. Make sure you discuss any questions you have with your health care provider.

## 2015-07-01 NOTE — Progress Notes (Signed)
Patient presents to clinic today c/o 4-5 days of suprapubic pressure and urinary urgency along with low back pain and chills. Denies fever, dysuria or hematuria. Endorses history of both UTIs and interstitial cystitis.   Past Medical History  Diagnosis Date  . Acute asthmatic bronchitis   . Hypercholesterolemia   . Hypothyroidism   . Interstitial cystitis   . Fibromyalgia   . Anxiety     Current Outpatient Prescriptions on File Prior to Visit  Medication Sig Dispense Refill  . ALPRAZolam (XANAX) 0.5 MG tablet Take 1 tablet (0.5 mg total) by mouth 3 (three) times daily as needed for anxiety. 90 tablet 0  . aspirin 81 MG tablet Take 81 mg by mouth daily.      Marland Kitchen atorvastatin (LIPITOR) 20 MG tablet Take 0.5 tablets (10 mg total) by mouth daily. 90 tablet 1  . Cholecalciferol (VITAMIN D) 1000 UNITS capsule Take 1,000 Units by mouth daily.      . Coconut Oil 1000 MG CAPS Take 1,000 mg by mouth daily.     . Coenzyme Q10 (COQ10) 100 MG CAPS Take 1 capsule by mouth daily.     . cyclobenzaprine (FLEXERIL) 10 MG tablet Take 1 tablet (10 mg total) by mouth daily. 90 tablet 1  . estradiol (ESTRACE) 0.5 MG tablet Take 0.25 mg by mouth daily.     Marland Kitchen levothyroxine (SYNTHROID, LEVOTHROID) 75 MCG tablet Take 1 tablet (75 mcg total) by mouth daily. 90 tablet 1  . NON FORMULARY Take 2 tablets by mouth daily. Bone-Up: Calcium Supplement    . vitamin C (ASCORBIC ACID) 500 MG tablet Take 1,000 mg by mouth daily.     . vitamin E 400 UNIT capsule Take 400 Units by mouth daily.       No current facility-administered medications on file prior to visit.    Allergies  Allergen Reactions  . Iodine Anaphylaxis  . Anesthetics, Amide Nausea And Vomiting  . Sulfonamide Derivatives     REACTION: throat swelling and itching  . Valtrex [Valacyclovir Hcl]     "made me deathly sick"    Family History  Problem Relation Age of Onset  . Alzheimer's disease Father   . Stroke Father   . Heart failure Father   .  Other Mother     bronchiectasis  . Pneumonia Mother   . Heart failure Mother   . Pancreatic cancer Brother   . Hyperlipidemia Sister   . Other Sister     bronchiectasis    Social History   Social History  . Marital Status: Divorced    Spouse Name: N/A  . Number of Children: 3  . Years of Education: N/A   Occupational History  . school teacher - substitute teaching now    Social History Main Topics  . Smoking status: Never Smoker   . Smokeless tobacco: Never Used  . Alcohol Use: Yes     Comment: Rare  . Drug Use: No  . Sexual Activity: Not Asked   Other Topics Concern  . None   Social History Narrative   Plays piano, 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   Declines flu and pneumonia shot 04-30-10   Review of Systems - See HPI.  All other ROS are negative.  BP 135/56 mmHg  Pulse 78  Temp(Src) 97.8 F (36.6 C) (Oral)  Ht 5\' 3"  (1.6 m)  Wt 122 lb 9.6 oz (55.611 kg)  BMI 21.72 kg/m2  SpO2 97%  Physical Exam  Constitutional: She is oriented to  person, place, and time and well-developed, well-nourished, and in no distress.  HENT:  Head: Normocephalic and atraumatic.  Eyes: Conjunctivae are normal.  Cardiovascular: Normal rate, regular rhythm, normal heart sounds and intact distal pulses.   Pulmonary/Chest: Effort normal and breath sounds normal. No respiratory distress. She has no wheezes. She has no rales. She exhibits no tenderness.  Abdominal: Soft. Bowel sounds are normal. There is tenderness in the suprapubic area. There is no CVA tenderness.  Neurological: She is alert and oriented to person, place, and time.  Skin: Skin is warm and dry. No rash noted.  Psychiatric: Affect normal.  Vitals reviewed.  Recent Results (from the past 2160 hour(s))  POCT urinalysis dipstick     Status: None   Collection Time: 04/25/15 12:48 PM  Result Value Ref Range   Color, UA straw    Clarity, UA clear    Glucose, UA negative    Bilirubin, UA negative    Ketones, UA negative    Spec  Grav, UA 1.010    Blood, UA negative    pH, UA 5.0    Protein, UA negative    Urobilinogen, UA     Nitrite, UA negative    Leukocytes, UA Negative Negative  CULTURE, URINE COMPREHENSIVE     Status: None   Collection Time: 04/25/15  1:22 PM  Result Value Ref Range   Culture ENTEROBACTER AEROGENES    Colony Count 15,000 COLONIES/ML    Organism ID, Bacteria ENTEROBACTER AEROGENES       Susceptibility   Enterobacter aerogenes -  (no method available)    AMOX/CLAVULANIC  Resistant     PIP/TAZO <=4 Sensitive     IMIPENEM 2 Intermediate     CEFAZOLIN  Resistant     CEFTRIAXONE <=1 Sensitive     CEFTAZIDIME <=1 Sensitive     CEFEPIME <=1 Sensitive     GENTAMICIN <=1 Sensitive     TOBRAMYCIN <=1 Sensitive     CIPROFLOXACIN <=0.25 Sensitive     LEVOFLOXACIN <=0.12 Sensitive     NITROFURANTOIN 64 Intermediate     TRIMETH/SULFA* <=20 Sensitive      * NR=NOT REPORTABLE,SEE COMMENTORAL therapy:A cefazolin MIC of <32 predicts susceptibility to the oral agents cefaclor,cefdinir,cefpodoxime,cefprozil,cefuroxime,cephalexin,and loracarbef when used for therapy of uncomplicated UTIs due to E.coli,K.pneumomiae,and P.mirabilis. PARENTERAL therapy: A cefazolinMIC of >8 indicates resistance to parenteralcefazolin. An alternate test method must beperformed to confirm susceptibility to parenteralcefazolin.  POCT Urinalysis Dipstick (Automated)     Status: None   Collection Time: 07/01/15 11:39 AM  Result Value Ref Range   Color, UA yellow    Clarity, UA clear    Glucose, UA neg    Bilirubin, UA neg    Ketones, UA neg    Spec Grav, UA 1.010    Blood, UA neg    pH, UA 6.0    Protein, UA neg    Urobilinogen, UA 0.2    Nitrite, UA neg    Leukocytes, UA Negative Negative   Assessment/Plan: UTI symptoms In patient with history of UTI and IC. Last visit with simialr symptoms, urine dip negative and culture positive for UTI. Urine dip negative today but giving history will start Cipro 500 mg BID while  culture is in process. Supportive measures reviewed. Tagamet also recommended at OTC dose to help with potential IC. Avoid trigger foods. Will alter regimen based on results. Recommended patient follow-up with Urology.

## 2015-07-01 NOTE — Progress Notes (Signed)
Pre visit review using our clinic review tool, if applicable. No additional management support is needed unless otherwise documented below in the visit note. 

## 2015-07-01 NOTE — Assessment & Plan Note (Signed)
In patient with history of UTI and IC. Last visit with simialr symptoms, urine dip negative and culture positive for UTI. Urine dip negative today but giving history will start Cipro 500 mg BID while culture is in process. Supportive measures reviewed. Tagamet also recommended at OTC dose to help with potential IC. Avoid trigger foods. Will alter regimen based on results. Recommended patient follow-up with Urology.

## 2015-07-02 ENCOUNTER — Other Ambulatory Visit: Payer: Self-pay

## 2015-07-02 DIAGNOSIS — Z1231 Encounter for screening mammogram for malignant neoplasm of breast: Secondary | ICD-10-CM

## 2015-07-02 LAB — URINE CULTURE
Colony Count: NO GROWTH
ORGANISM ID, BACTERIA: NO GROWTH

## 2015-07-22 ENCOUNTER — Ambulatory Visit: Payer: BLUE CROSS/BLUE SHIELD

## 2015-07-22 DIAGNOSIS — H2513 Age-related nuclear cataract, bilateral: Secondary | ICD-10-CM | POA: Diagnosis not present

## 2015-08-01 ENCOUNTER — Telehealth: Payer: Self-pay | Admitting: Physician Assistant

## 2015-08-01 NOTE — Telephone Encounter (Signed)
Patient informed of PCP instructions. 

## 2015-08-01 NOTE — Telephone Encounter (Signed)
Caller name: Irena   Relationship to patient: Self  Can be reached: [phone]  Pharmacy: [facility], [place] - [address]  Reason for call: pt says that she dont have the flu. She says that she is just experiencing a runny nose. Pt would like to know if PCP would call her something in to the pharmacy.

## 2015-08-01 NOTE — Telephone Encounter (Signed)
She needs to increase her fluids and use some saline nasal spray (over-the-counter) to help with runny nose. Can take over the counter Delsym for cough. She would need to be seen before anything would be prescribed.

## 2015-08-04 ENCOUNTER — Other Ambulatory Visit: Payer: Self-pay | Admitting: Physician Assistant

## 2015-08-04 ENCOUNTER — Encounter: Payer: Self-pay | Admitting: Physician Assistant

## 2015-08-04 ENCOUNTER — Ambulatory Visit (INDEPENDENT_AMBULATORY_CARE_PROVIDER_SITE_OTHER): Payer: Medicare Other | Admitting: Physician Assistant

## 2015-08-04 ENCOUNTER — Telehealth: Payer: Self-pay | Admitting: Physician Assistant

## 2015-08-04 VITALS — BP 122/64 | HR 82 | Temp 98.1°F | Ht 63.0 in | Wt 122.8 lb

## 2015-08-04 DIAGNOSIS — J208 Acute bronchitis due to other specified organisms: Principal | ICD-10-CM

## 2015-08-04 DIAGNOSIS — J00 Acute nasopharyngitis [common cold]: Secondary | ICD-10-CM | POA: Diagnosis not present

## 2015-08-04 DIAGNOSIS — R399 Unspecified symptoms and signs involving the genitourinary system: Secondary | ICD-10-CM

## 2015-08-04 DIAGNOSIS — B9689 Other specified bacterial agents as the cause of diseases classified elsewhere: Secondary | ICD-10-CM | POA: Insufficient documentation

## 2015-08-04 MED ORDER — ATORVASTATIN CALCIUM 20 MG PO TABS: 10.0000 mg | ORAL_TABLET | Freq: Every day | ORAL | Status: DC

## 2015-08-04 MED ORDER — LEVOTHYROXINE SODIUM 75 MCG PO TABS: 75.0000 ug | ORAL_TABLET | Freq: Every day | ORAL | Status: DC

## 2015-08-04 MED ORDER — CIMETIDINE 200 MG PO TABS: 200.0000 mg | ORAL_TABLET | Freq: Two times a day (BID) | ORAL | Status: DC

## 2015-08-04 MED ORDER — AZITHROMYCIN 250 MG PO TABS: ORAL_TABLET | ORAL | Status: DC

## 2015-08-04 MED ORDER — CYCLOBENZAPRINE HCL 10 MG PO TABS: 10.0000 mg | ORAL_TABLET | Freq: Every day | ORAL | Status: DC

## 2015-08-04 NOTE — Progress Notes (Signed)
Pre visit review using our clinic review tool, if applicable. No additional management support is needed unless otherwise documented below in the visit note. 

## 2015-08-04 NOTE — Telephone Encounter (Signed)
Handled. [facility] has been faxed in. Medications sent to [facility].

## 2015-08-04 NOTE — Assessment & Plan Note (Signed)
Rx Azithromycin.  Increase fluids.  Rest.  Saline nasal spray.  Probiotic.  Mucinex as directed.  Humidifier in bedroom.  Call or return to clinic if symptoms are not improving.  

## 2015-08-04 NOTE — Progress Notes (Signed)
Patient presents to clinic today c/o 4-5 days of worsening chest congestion associated with sore throat and cough that is productive of green sputum. Endorses fatigue. Denies chest pain or SOB. Denies recent travel or sick contact.  Patient also requesting refill of her Cimetdine for interstitial cystitis. Endorses taking as directed with significant improvement in symptoms.  Past Medical History  Diagnosis Date  . Acute asthmatic bronchitis   . Hypercholesterolemia   . Hypothyroidism   . Interstitial cystitis   . Fibromyalgia   . Anxiety     Current Outpatient Prescriptions on File Prior to Visit  Medication Sig Dispense Refill  . ALPRAZolam (XANAX) 0.5 MG tablet Take 1 tablet (0.5 mg total) by mouth 3 (three) times daily as needed for anxiety. 90 tablet 0  . aspirin 81 MG tablet Take 81 mg by mouth daily.      Marland Kitchen atorvastatin (LIPITOR) 20 MG tablet Take 0.5 tablets (10 mg total) by mouth daily. 90 tablet 1  . Cholecalciferol (VITAMIN D) 1000 UNITS capsule Take 1,000 Units by mouth daily.      . cimetidine (TAGAMET) 200 MG tablet Take 1 tablet (200 mg total) by mouth 2 (two) times daily. 60 tablet 0  . ciprofloxacin (CIPRO) 500 MG tablet Take 1 tablet (500 mg total) by mouth 2 (two) times daily. 10 tablet 0  . Coconut Oil 1000 MG CAPS Take 1,000 mg by mouth daily.     . Coenzyme Q10 (COQ10) 100 MG CAPS Take 1 capsule by mouth daily.     . cyclobenzaprine (FLEXERIL) 10 MG tablet Take 1 tablet (10 mg total) by mouth daily. 90 tablet 1  . estradiol (ESTRACE) 0.5 MG tablet Take 0.25 mg by mouth daily.     . NON FORMULARY Take 2 tablets by mouth daily. Bone-Up: Calcium Supplement    . vitamin C (ASCORBIC ACID) 500 MG tablet Take 1,000 mg by mouth daily.     . vitamin E 400 UNIT capsule Take 400 Units by mouth daily.       No current facility-administered medications on file prior to visit.    Allergies  Allergen Reactions  . Iodine Anaphylaxis  . Anesthetics, Amide Nausea And  Vomiting  . Sulfonamide Derivatives     REACTION: throat swelling and itching  . Valtrex [Valacyclovir Hcl]     "made me deathly sick"    Family History  Problem Relation Age of Onset  . Alzheimer's disease Father   . Stroke Father   . Heart failure Father   . Other Mother     bronchiectasis  . Pneumonia Mother   . Heart failure Mother   . Pancreatic cancer Brother   . Hyperlipidemia Sister   . Other Sister     bronchiectasis    Social History   Social History  . Marital Status: Divorced    Spouse Name: N/A  . Number of Children: 3  . Years of Education: N/A   Occupational History  . school teacher - substitute teaching now    Social History Main Topics  . Smoking status: Never Smoker   . Smokeless tobacco: Never Used  . Alcohol Use: Yes     Comment: Rare  . Drug Use: No  . Sexual Activity: Not Asked   Other Topics Concern  . None   Social History Narrative   Plays piano, 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   Declines flu and pneumonia shot 04-30-10   Review of Systems - See HPI.  All other ROS  are negative.  BP 122/64 mmHg  Pulse 82  Temp(Src) 98.1 F (36.7 C) (Oral)  Ht 5\' 3"  (1.6 m)  Wt 122 lb 12.8 oz (55.702 kg)  BMI 21.76 kg/m2  SpO2 99%  Physical Exam  Constitutional: She is oriented to person, place, and time and well-developed, well-nourished, and in no distress.  HENT:  Head: Normocephalic and atraumatic.  Right Ear: External ear normal.  Left Ear: External ear normal.  Nose: Nose normal.  Mouth/Throat: Oropharynx is clear and moist. No oropharyngeal exudate.  TM within normal limits bilaterally.  Eyes: Conjunctivae are normal.  Neck: Neck supple.  Cardiovascular: Normal rate, regular rhythm, normal heart sounds and intact distal pulses.   Pulmonary/Chest: Effort normal and breath sounds normal. No respiratory distress. She has no wheezes. She has no rales. She exhibits no tenderness.  Neurological: She is alert and oriented to person, place, and  time.  Skin: Skin is warm and dry. No rash noted.  Psychiatric: Affect normal.  Vitals reviewed.  Recent Results (from the past 2160 hour(s))  POCT Urinalysis Dipstick (Automated)     Status: None   Collection Time: 07/01/15 11:39 AM  Result Value Ref Range   Color, UA yellow    Clarity, UA clear    Glucose, UA neg    Bilirubin, UA neg    Ketones, UA neg    Spec Grav, UA 1.010    Blood, UA neg    pH, UA 6.0    Protein, UA neg    Urobilinogen, UA 0.2    Nitrite, UA neg    Leukocytes, UA Negative Negative  Urine Culture     Status: None   Collection Time: 07/01/15 11:44 AM  Result Value Ref Range   Colony Count NO GROWTH    Organism ID, Bacteria NO GROWTH     Assessment/Plan: Acute bacterial bronchitis Rx Azithromycin.  Increase fluids.  Rest.  Saline nasal spray.  Probiotic.  Mucinex as directed.  Humidifier in bedroom.  Call or return to clinic if symptoms are not improving.

## 2015-08-04 NOTE — Telephone Encounter (Signed)
Relation to [id number] Call back number:[phone] Pharmacy:  Reason for call:  Patient wanted to inform PCP she would like any future medication to go to [facility] [phone] patient states [facility] would not give her the fax #

## 2015-08-04 NOTE — Patient Instructions (Signed)
Take antibiotic (Azithromycin) as directed.  Increase fluids.  Get plenty of rest. Use Mucinex for congestion. Take a daily probiotic (I recommend Align or Culturelle, but even Activia Yogurt may be beneficial).  A humidifier placed in the bedroom may offer some relief for a dry, scratchy throat of nasal irritation.  Read information below on acute bronchitis. Please call or return to clinic if symptoms are not improving.  Acute Bronchitis Bronchitis is when the airways that extend from the windpipe into the lungs get red, puffy, and painful (inflamed). Bronchitis often causes thick spit (mucus) to develop. This leads to a cough. A cough is the most common symptom of bronchitis. In acute bronchitis, the condition usually begins suddenly and goes away over time (usually in 2 weeks). Smoking, allergies, and asthma can make bronchitis worse. Repeated episodes of bronchitis may cause more lung problems.  HOME CARE  Rest.  Drink enough fluids to keep your pee (urine) clear or pale yellow (unless you need to limit fluids as told by your doctor).  Only take over-the-counter or prescription medicines as told by your doctor.  Avoid smoking and secondhand smoke. These can make bronchitis worse. If you are a smoker, think about using nicotine gum or skin patches. Quitting smoking will help your lungs heal faster.  Reduce the chance of getting bronchitis again by:  Washing your hands often.  Avoiding people with cold symptoms.  Trying not to touch your hands to your mouth, nose, or eyes.  Follow up with your doctor as told.  GET HELP IF: Your symptoms do not improve after 1 week of treatment. Symptoms include:  Cough.  Fever.  Coughing up thick spit.  Body aches.  Chest congestion.  Chills.  Shortness of breath.  Sore throat.  GET HELP RIGHT AWAY IF:   You have an increased fever.  You have chills.  You have severe shortness of breath.  You have bloody thick spit  (sputum).  You throw up (vomit) often.  You lose too much body fluid (dehydration).  You have a severe headache.  You faint.  MAKE SURE YOU:   Understand these instructions.  Will watch your condition.  Will get help right away if you are not doing well or get worse. Document Released: 11/17/2007 Document Revised: 01/31/2013 Document Reviewed: 11/21/2012 ExitCare Patient Information 2015 [employer]. This information is not intended to replace advice given to you by your health care provider. Make sure you discuss any questions you have with your health care provider.   

## 2015-08-12 ENCOUNTER — Ambulatory Visit
Admission: RE | Admit: 2015-08-12 | Discharge: 2015-08-12 | Disposition: A | Discharge status: Home / Self Care | Payer: Medicare Other | Source: Ambulatory Visit

## 2015-08-12 DIAGNOSIS — Z1231 Encounter for screening mammogram for malignant neoplasm of breast: Secondary | ICD-10-CM | POA: Diagnosis not present

## 2015-08-13 DIAGNOSIS — Z0000 Encounter for general adult medical examination without abnormal findings: Secondary | ICD-10-CM | POA: Diagnosis not present

## 2015-08-13 DIAGNOSIS — R350 Frequency of micturition: Secondary | ICD-10-CM | POA: Diagnosis not present

## 2015-08-13 DIAGNOSIS — R351 Nocturia: Secondary | ICD-10-CM | POA: Diagnosis not present

## 2015-08-13 DIAGNOSIS — N3020 Other chronic cystitis without hematuria: Secondary | ICD-10-CM | POA: Diagnosis not present

## 2015-08-14 ENCOUNTER — Other Ambulatory Visit: Payer: Self-pay | Admitting: Obstetrics and Gynecology

## 2015-08-14 DIAGNOSIS — R928 Other abnormal and inconclusive findings on diagnostic imaging of breast: Secondary | ICD-10-CM

## 2015-08-21 ENCOUNTER — Ambulatory Visit
Admission: RE | Admit: 2015-08-21 | Discharge: 2015-08-21 | Disposition: A | Discharge status: Home / Self Care | Payer: Medicare Other | Source: Ambulatory Visit | Attending: Obstetrics and Gynecology | Admitting: Obstetrics and Gynecology

## 2015-08-21 DIAGNOSIS — R928 Other abnormal and inconclusive findings on diagnostic imaging of breast: Secondary | ICD-10-CM

## 2015-08-21 DIAGNOSIS — N63 Unspecified lump in breast: Secondary | ICD-10-CM | POA: Diagnosis not present

## 2015-09-05 NOTE — Progress Notes (Signed)
HPI: FU chest pain; previously seen for atypical CP; stress echo 9/15 showed baseline EF 45; inferior, septal and apical HK both at rest and with stress. Cardiac catheterization September 2015 showed normal coronary arteries. Since last seen, she denies dyspnea, chest pain, palpitations or syncope. She does note some fatigue.  Current Outpatient Prescriptions  Medication Sig Dispense Refill  . ALPRAZolam (XANAX) 0.5 MG tablet Take 1 tablet (0.5 mg total) by mouth 3 (three) times daily as needed for anxiety. 90 tablet 0  . aspirin 81 MG tablet Take 81 mg by mouth daily.      Marland Kitchen atorvastatin (LIPITOR) 20 MG tablet Take 0.5 tablets (10 mg total) by mouth daily. 45 tablet 1  . Cholecalciferol (VITAMIN D) 1000 UNITS capsule Take 1,000 Units by mouth daily.      . cimetidine (TAGAMET) 200 MG tablet Take 1 tablet (200 mg total) by mouth 2 (two) times daily. 180 tablet 1  . Coconut Oil 1000 MG CAPS Take 1,000 mg by mouth daily.     . Coenzyme Q10 (COQ10) 100 MG CAPS Take 1 capsule by mouth daily.     . cyclobenzaprine (FLEXERIL) 10 MG tablet Take 1 tablet (10 mg total) by mouth daily. 90 tablet 1  . estradiol (ESTRACE) 0.5 MG tablet Take 0.25 mg by mouth daily.     . famciclovir (FAMVIR) 500 MG tablet Take 1 tablet (500 mg total) by mouth 2 (two) times daily. 14 tablet 3  . levothyroxine (SYNTHROID, LEVOTHROID) 75 MCG tablet Take 1 tablet (75 mcg total) by mouth daily. 90 tablet 1  . NON FORMULARY Take 2 tablets by mouth daily. Bone-Up: Calcium Supplement    . vitamin C (ASCORBIC ACID) 500 MG tablet Take 1,000 mg by mouth daily.     . vitamin E 400 UNIT capsule Take 400 Units by mouth daily.       No current facility-administered medications for this visit.     Past Medical History  Diagnosis Date  . Acute asthmatic bronchitis   . Hypercholesterolemia   . Hypothyroidism   . Interstitial cystitis   . Fibromyalgia   . Anxiety     Past Surgical History  Procedure Laterality Date  .  Total abdominal hysterectomy  1997  . Left heart catheterization with coronary angiogram Bilateral 03/13/2014    Procedure: LEFT HEART CATHETERIZATION WITH CORONARY ANGIOGRAM;  Surgeon: Blane Ohara, MD;  Location: [facility] CATH LAB;  Service: Cardiovascular;  Laterality: Bilateral;    Social History   Social History  . Marital Status: Divorced    Spouse Name: N/A  . Number of Children: 3  . Years of Education: N/A   Occupational History  . school teacher - substitute teaching now    Social History Main Topics  . Smoking status: Never Smoker   . Smokeless tobacco: Never Used  . Alcohol Use: Yes     Comment: Rare  . Drug Use: No  . Sexual Activity: Not on file   Other Topics Concern  . Not on file   Social History Narrative   Plays piano, 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   Declines flu and pneumonia shot 04-30-10    Family History  Problem Relation Age of Onset  . Alzheimer's disease Father   . Stroke Father   . Heart failure Father   . Other Mother     bronchiectasis  . Pneumonia Mother   . Heart failure Mother   . Pancreatic cancer Brother   .  Hyperlipidemia Sister   . Other Sister     bronchiectasis    ROS: Fatigue but no fevers or chills, productive cough, hemoptysis, dysphasia, odynophagia, melena, hematochezia, dysuria, hematuria, rash, seizure activity, orthopnea, PND, pedal edema, claudication. Remaining systems are negative.  Physical Exam: Well-developed well-nourished in no acute distress.  Skin is warm and dry.  HEENT is normal.  Neck is supple.  Chest is clear to auscultation with normal expansion.  Cardiovascular exam is regular rate and rhythm.  Abdominal exam nontender or distended. No masses palpated. Extremities show no edema. neuro grossly intact  ECG Sinus rhythm at a rate of 83. Inferior lateral T-wave inversion and ST depression. Right axis deviation.

## 2015-09-08 ENCOUNTER — Telehealth: Payer: Self-pay | Admitting: Physician Assistant

## 2015-09-08 ENCOUNTER — Telehealth: Payer: Self-pay | Admitting: *Deleted

## 2015-09-08 NOTE — Telephone Encounter (Signed)
Pharmacy: [facility] - [place], [place] - [id number]  Reason for call: Pt has outbreak of shingles again. She is requesting RX be sent in for [place].

## 2015-09-08 NOTE — Telephone Encounter (Signed)
Pt aware and verbalized understanding. Appt scheduled 09/09/15 at 5pm w/ Dr. Etter Sjogren. Pt declined only remaining appt for today at 4pm d/t scheduling conflicts.

## 2015-09-08 NOTE — Telephone Encounter (Signed)
Cannot have -- [facility] Note indicates rash x 2 week. Shingles medication must be given within 48-72 hours on onset of rash. She needs appointment.

## 2015-09-08 NOTE — Telephone Encounter (Signed)
[facility] note received via fax  Call:   Date: 09/06/15 Time: [id number]   Caller: Jaclyn Bennett, self Return number: [phone]  Nurse: Kassie Mends, RN  Chief Complaint: Rash-Widespread  Reason for call: Caller states that she began breaking out in a rash approx 2 wks ago on her left buttock. States that it is shingles but has not been seen. States she she has had singles approx 5x and that sometimes treats with calamine lotion but sometimes needs to take an antiviral. Rash not improving with calamine and requesting famciclovir.   Related visit to physician within the last 2 weeks:  Guideline: Shings; shingles rash and onset > 72 hrs ago  Disposition: See Physician within 24 Hours  **Pt called into office this morning to request rx. Request forwarded to provider for review. See phone note.**

## 2015-09-09 ENCOUNTER — Encounter: Payer: Self-pay | Admitting: Family Medicine

## 2015-09-09 ENCOUNTER — Ambulatory Visit (INDEPENDENT_AMBULATORY_CARE_PROVIDER_SITE_OTHER): Payer: Medicare Other | Admitting: Family Medicine

## 2015-09-09 VITALS — BP 110/72 | HR 67 | Temp 98.3°F | Wt 122.8 lb

## 2015-09-09 DIAGNOSIS — B029 Zoster without complications: Secondary | ICD-10-CM

## 2015-09-09 MED ORDER — FAMCICLOVIR 500 MG PO TABS: 500.0000 mg | ORAL_TABLET | Freq: Two times a day (BID) | ORAL | Status: DC

## 2015-09-09 NOTE — Patient Instructions (Signed)

## 2015-09-09 NOTE — Progress Notes (Signed)
Patient ID: Jaclyn Bennett, female    DOB: March 18, 1951  Age: 65 y.o. MRN: [id number]    Subjective:  Subjective HPI LEVY SCOTTO presents for rash on buttocks.  She was dx with recurrent shingles and everytime she is under stress she breaks out with shingles.    Review of Systems  Constitutional: Negative for diaphoresis, appetite change, fatigue and unexpected weight change.  Eyes: Negative for pain, redness and visual disturbance.  Respiratory: Negative for cough, chest tightness, shortness of breath and wheezing.   Cardiovascular: Negative for chest pain, palpitations and leg swelling.  Endocrine: Negative for cold intolerance, heat intolerance, polydipsia, polyphagia and polyuria.  Genitourinary: Negative for dysuria, frequency and difficulty urinating.  Neurological: Negative for dizziness, light-headedness, numbness and headaches.    History Past Medical History  Diagnosis Date  . Acute asthmatic bronchitis   . Hypercholesterolemia   . Hypothyroidism   . Interstitial cystitis   . Fibromyalgia   . Anxiety     She has past surgical history that includes Total abdominal hysterectomy (1997) and left heart catheterization with coronary angiogram (Bilateral, 03/13/2014).   Her family history includes Alzheimer's disease in her father; Heart failure in her father and mother; Hyperlipidemia in her sister; Other in her mother and sister; Pancreatic cancer in her brother; Pneumonia in her mother; Stroke in her father.She reports that she has never smoked. She has never used smokeless tobacco. She reports that she drinks alcohol. She reports that she does not use illicit drugs.  Current Outpatient Prescriptions on File Prior to Visit  Medication Sig Dispense Refill  . ALPRAZolam (XANAX) 0.5 MG tablet Take 1 tablet (0.5 mg total) by mouth 3 (three) times daily as needed for anxiety. 90 tablet 0  . aspirin 81 MG tablet Take 81 mg by mouth daily.      Marland Kitchen atorvastatin (LIPITOR) 20 MG  tablet Take 0.5 tablets (10 mg total) by mouth daily. 45 tablet 1  . Cholecalciferol (VITAMIN D) 1000 UNITS capsule Take 1,000 Units by mouth daily.      . cimetidine (TAGAMET) 200 MG tablet Take 1 tablet (200 mg total) by mouth 2 (two) times daily. 180 tablet 1  . Coconut Oil 1000 MG CAPS Take 1,000 mg by mouth daily.     . Coenzyme Q10 (COQ10) 100 MG CAPS Take 1 capsule by mouth daily.     . cyclobenzaprine (FLEXERIL) 10 MG tablet Take 1 tablet (10 mg total) by mouth daily. 90 tablet 1  . estradiol (ESTRACE) 0.5 MG tablet Take 0.25 mg by mouth daily.     Marland Kitchen levothyroxine (SYNTHROID, LEVOTHROID) 75 MCG tablet Take 1 tablet (75 mcg total) by mouth daily. 90 tablet 1  . NON FORMULARY Take 2 tablets by mouth daily. Bone-Up: Calcium Supplement    . vitamin C (ASCORBIC ACID) 500 MG tablet Take 1,000 mg by mouth daily.     . vitamin E 400 UNIT capsule Take 400 Units by mouth daily.       No current facility-administered medications on file prior to visit.     Objective:  Objective Physical Exam  Constitutional: She is oriented to person, place, and time. She appears well-developed and well-nourished.  HENT:  Head: Normocephalic and atraumatic.  Eyes: Conjunctivae and EOM are normal.  Neck: Normal range of motion. Neck supple. No JVD present. Carotid bruit is not present. No thyromegaly present.  Cardiovascular: Normal rate, regular rhythm and normal heart sounds.   No murmur heard. Pulmonary/Chest: Effort normal and breath  sounds normal. No respiratory distress. She has no wheezes. She has no rales. She exhibits no tenderness.  Musculoskeletal: She exhibits no edema.  Neurological: She is alert and oriented to person, place, and time.  Skin:     Psychiatric: She has a normal mood and affect. Her behavior is normal. Judgment and thought content normal.  Nursing note and vitals reviewed.  BP 110/72 mmHg  Pulse 67  Temp(Src) 98.3 F (36.8 C) (Oral)  Wt 122 lb 12.8 oz (55.702 kg)  SpO2  96% Wt Readings from Last 3 Encounters:  09/09/15 122 lb 12.8 oz (55.702 kg)  08/04/15 122 lb 12.8 oz (55.702 kg)  07/01/15 122 lb 9.6 oz (55.611 kg)     Lab Results  Component Value Date   WBC 4.8 12/24/2014   HGB 13.1 12/24/2014   HCT 39.4 12/24/2014   PLT 306.0 12/24/2014   GLUCOSE 88 12/24/2014   CHOL 154 12/24/2014   TRIG 113.0 12/24/2014   HDL 51.20 12/24/2014   LDLDIRECT 156.8 11/26/2008   LDLCALC 80 12/24/2014   ALT 26 12/24/2014   AST 25 12/24/2014   NA 142 12/24/2014   K 3.9 12/24/2014   CL 104 12/24/2014   CREATININE 0.80 12/24/2014   BUN 16 12/24/2014   CO2 33* 12/24/2014   TSH 2.53 12/24/2014   INR 1.03 03/04/2014   HGBA1C 6.0* 12/10/2013    No results found.   Assessment & Plan:  Plan I have discontinued Ms. Vaile's ciprofloxacin and azithromycin. I am also having her start on famciclovir. Additionally, I am having her maintain her aspirin, CoQ10, vitamin C, Vitamin D, vitamin E, NON FORMULARY, Coconut Oil, estradiol, ALPRAZolam, levothyroxine, cyclobenzaprine, cimetidine, and atorvastatin.  Meds ordered this encounter  Medications  . famciclovir (FAMVIR) 500 MG tablet    Sig: Take 1 tablet (500 mg total) by mouth 2 (two) times daily.    Dispense:  14 tablet    Refill:  3    Problem List Items Addressed This Visit    None    Visit Diagnoses    Shingles    -  Primary    Relevant Medications    famciclovir (FAMVIR) 500 MG tablet       Follow-up: Return if symptoms worsen or fail to improve.  [name], DO

## 2015-09-09 NOTE — Progress Notes (Signed)
Pre visit review using our clinic review tool, if applicable. No additional management support is needed unless otherwise documented below in the visit note. 

## 2015-09-11 ENCOUNTER — Ambulatory Visit (INDEPENDENT_AMBULATORY_CARE_PROVIDER_SITE_OTHER): Payer: Medicare Other | Admitting: Cardiology

## 2015-09-11 ENCOUNTER — Encounter: Payer: Self-pay | Admitting: Cardiology

## 2015-09-11 VITALS — BP 114/70 | HR 83 | Ht 63.0 in | Wt 122.0 lb

## 2015-09-11 DIAGNOSIS — R9431 Abnormal electrocardiogram [ECG] [EKG]: Secondary | ICD-10-CM

## 2015-09-11 DIAGNOSIS — E7800 Pure hypercholesterolemia, unspecified: Secondary | ICD-10-CM | POA: Diagnosis not present

## 2015-09-11 DIAGNOSIS — R072 Precordial pain: Secondary | ICD-10-CM

## 2015-09-11 NOTE — Assessment & Plan Note (Deleted)
Previous catheterization revealed no coronary disease.

## 2015-09-11 NOTE — Assessment & Plan Note (Signed)
Continue statin. 

## 2015-09-11 NOTE — Patient Instructions (Signed)

## 2015-09-11 NOTE — Assessment & Plan Note (Signed)
Previous study showed mildly reduced LV function. Repeat echocardiogram.

## 2015-09-11 NOTE — Assessment & Plan Note (Signed)
No further chest pain. Previous catheterization showed no obstructive coronary disease.No plans for further evaluation.

## 2015-09-26 ENCOUNTER — Ambulatory Visit (HOSPITAL_COMMUNITY): Payer: Medicare Other | Attending: Cardiovascular Disease

## 2015-09-26 ENCOUNTER — Other Ambulatory Visit: Payer: Self-pay

## 2015-09-26 DIAGNOSIS — E785 Hyperlipidemia, unspecified: Secondary | ICD-10-CM | POA: Insufficient documentation

## 2015-09-26 DIAGNOSIS — R9431 Abnormal electrocardiogram [ECG] [EKG]: Secondary | ICD-10-CM | POA: Insufficient documentation

## 2015-09-29 ENCOUNTER — Telehealth: Payer: Self-pay | Admitting: *Deleted

## 2015-09-29 ENCOUNTER — Ambulatory Visit: Payer: Medicare Other | Admitting: Cardiology

## 2015-09-29 DIAGNOSIS — I429 Cardiomyopathy, unspecified: Secondary | ICD-10-CM

## 2015-09-29 MED ORDER — LISINOPRIL 2.5 MG PO TABS: 2.5000 mg | ORAL_TABLET | Freq: Every day | ORAL | Status: DC

## 2015-09-29 NOTE — Telephone Encounter (Signed)
-----   Message from Lelon Perla, MD sent at 09/26/2015  8:23 PM EDT ----- Add lisinopril 2.5 mg daily, bmet one week, nonurgent fuov [facility]

## 2015-09-29 NOTE — Telephone Encounter (Signed)
Spoke with pt, Aware of dr crenshaw's recommendations.  ?New script sent to the pharmacy  ?Lab orders mailed to the pt  ?

## 2015-09-30 ENCOUNTER — Telehealth: Payer: Self-pay | Admitting: Cardiology

## 2015-09-30 NOTE — Telephone Encounter (Signed)
Spoke with patient regarding Lisinopril  She read over the information given to her when she picked up the Rx as it said she should be checking blood pressure twice a day Patient does not have a way to check her blood pressure at home Advised if she wanted to get a blood pressure machine and monitor to get an [employer] arm cuff, not wrist cuff Explained to patient she was being started on a low dose of Lisinopril and to call back if she started feeling dizzy, lightheaded, or just not well.  Patient verbalized understanding  Confirmed labs next week and follow up with Dr Stanford Breed

## 2015-09-30 NOTE — Telephone Encounter (Signed)
New message      Pt c/o medication issue:  1. Name of Medication: lisinopril 2. How are you currently taking this medication (dosage and times per day)? 2.5mg  daily 3. Are you having a reaction (difficulty breathing--STAT)? no 4. What is your medication issue?  Pt picked up presc today and have questions.  Please call

## 2015-10-07 ENCOUNTER — Telehealth: Payer: Self-pay | Admitting: Cardiology

## 2015-10-07 NOTE — Telephone Encounter (Signed)
Spoke with pt, she does not have a way of checking her bp at home but she is very fatigued and tired. She reports she felt this way before when she had trouble with her bp being low. ]okay given for pt to stop the lisinopril. Encouraged pt to f/u with her medical doctor if fatigue does not improve off the lisinopril.

## 2015-10-07 NOTE — Telephone Encounter (Signed)
Pt called in stating that since she has started taking Lisinopril she has felt like her BP is dropping. Please f/u with her  Thanks

## 2015-10-09 DIAGNOSIS — I429 Cardiomyopathy, unspecified: Secondary | ICD-10-CM | POA: Diagnosis not present

## 2015-10-09 DIAGNOSIS — I428 Other cardiomyopathies: Secondary | ICD-10-CM | POA: Diagnosis not present

## 2015-10-10 LAB — BASIC METABOLIC PANEL
BUN: 18 mg/dL (ref 7–25)
CO2: 26 mmol/L (ref 20–31)
Calcium: 8.9 mg/dL (ref 8.6–10.4)
Chloride: 102 mmol/L (ref 98–110)
Creat: 0.99 mg/dL (ref 0.50–0.99)
GLUCOSE: 101 mg/dL — AB (ref 65–99)
Potassium: 3.8 mmol/L (ref 3.5–5.3)
Sodium: 141 mmol/L (ref 135–146)

## 2015-10-17 NOTE — Progress Notes (Signed)
HPI: FU cardiomyopathy; previously seen for atypical CP; stress echo 9/15 showed baseline EF 45; inferior, septal and apical HK both at rest and with stress. Cardiac catheterization September 2015 showed normal coronary arteries. Echocardiogram repeated April 2017 and showed ejection fraction [id number] grade 1 diastolic dysfunction. Since last seen, she denies dyspnea, chest pain, palpitations or syncope. Some fatigue.  Current Outpatient Prescriptions  Medication Sig Dispense Refill  . ALPRAZolam (XANAX) 0.5 MG tablet Take 1 tablet (0.5 mg total) by mouth 3 (three) times daily as needed for anxiety. 90 tablet 0  . aspirin 81 MG tablet Take 81 mg by mouth daily.      Marland Kitchen atorvastatin (LIPITOR) 20 MG tablet Take 0.5 tablets (10 mg total) by mouth daily. 45 tablet 1  . Cholecalciferol (VITAMIN D) 1000 UNITS capsule Take 1,000 Units by mouth daily.      . cimetidine (TAGAMET) 200 MG tablet Take 1 tablet (200 mg total) by mouth 2 (two) times daily. 180 tablet 1  . Coconut Oil 1000 MG CAPS Take 1,000 mg by mouth daily.     . Coenzyme Q10 (COQ10) 100 MG CAPS Take 1 capsule by mouth daily.     . cyclobenzaprine (FLEXERIL) 10 MG tablet Take 1 tablet (10 mg total) by mouth daily. 90 tablet 1  . estradiol (ESTRACE) 0.5 MG tablet Take 0.25 mg by mouth daily.     . famciclovir (FAMVIR) 500 MG tablet Take 1 tablet (500 mg total) by mouth 2 (two) times daily. 14 tablet 3  . levothyroxine (SYNTHROID, LEVOTHROID) 75 MCG tablet Take 1 tablet (75 mcg total) by mouth daily. 90 tablet 1  . lisinopril (PRINIVIL,ZESTRIL) 2.5 MG tablet Take 1 tablet (2.5 mg total) by mouth daily. 30 tablet 12  . NON FORMULARY Take 2 tablets by mouth daily. Bone-Up: Calcium Supplement    . vitamin C (ASCORBIC ACID) 500 MG tablet Take 1,000 mg by mouth daily.     . vitamin E 400 UNIT capsule Take 400 Units by mouth daily.       No current facility-administered medications for this visit.     Past Medical History  Diagnosis Date    . Acute asthmatic bronchitis   . Hypercholesterolemia   . Hypothyroidism   . Interstitial cystitis   . Fibromyalgia   . Anxiety     Past Surgical History  Procedure Laterality Date  . Total abdominal hysterectomy  1997  . Left heart catheterization with coronary angiogram Bilateral 03/13/2014    Procedure: LEFT HEART CATHETERIZATION WITH CORONARY ANGIOGRAM;  Surgeon: Blane Ohara, MD;  Location: [facility] CATH LAB;  Service: Cardiovascular;  Laterality: Bilateral;    Social History   Social History  . Marital Status: Divorced    Spouse Name: N/A  . Number of Children: 3  . Years of Education: N/A   Occupational History  . school teacher - substitute teaching now    Social History Main Topics  . Smoking status: Never Smoker   . Smokeless tobacco: Never Used  . Alcohol Use: Yes     Comment: Rare  . Drug Use: No  . Sexual Activity: Not on file   Other Topics Concern  . Not on file   Social History Narrative   Plays piano, 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   Declines flu and pneumonia shot 04-30-10    Family History  Problem Relation Age of Onset  . Alzheimer's disease Father   . Stroke Father   . Heart failure Father   .  Other Mother     bronchiectasis  . Pneumonia Mother   . Heart failure Mother   . Pancreatic cancer Brother   . Hyperlipidemia Sister   . Other Sister     bronchiectasis    ROS: no fevers or chills, productive cough, hemoptysis, dysphasia, odynophagia, melena, hematochezia, dysuria, hematuria, rash, seizure activity, orthopnea, PND, pedal edema, claudication. Remaining systems are negative.  Physical Exam: Well-developed well-nourished in no acute distress.  Skin is warm and dry.  HEENT is normal.  Neck is supple.  Chest is clear to auscultation with normal expansion.  Cardiovascular exam is regular rate and rhythm.  Abdominal exam nontender or distended. No masses palpated. Extremities show no edema. neuro grossly intact

## 2015-10-22 ENCOUNTER — Ambulatory Visit (INDEPENDENT_AMBULATORY_CARE_PROVIDER_SITE_OTHER): Payer: Medicare Other | Admitting: Cardiology

## 2015-10-22 ENCOUNTER — Encounter: Payer: Self-pay | Admitting: Cardiology

## 2015-10-22 VITALS — BP 105/56 | HR 80 | Ht 63.0 in | Wt 121.4 lb

## 2015-10-22 DIAGNOSIS — I420 Dilated cardiomyopathy: Secondary | ICD-10-CM

## 2015-10-22 DIAGNOSIS — E7800 Pure hypercholesterolemia, unspecified: Secondary | ICD-10-CM | POA: Diagnosis not present

## 2015-10-22 MED ORDER — METOPROLOL SUCCINATE ER 25 MG PO TB24
12.5000 mg | ORAL_TABLET | Freq: Every day | ORAL | Status: DC
Start: 2015-10-22 — End: 2016-07-14

## 2015-10-22 NOTE — Assessment & Plan Note (Signed)
Patient's LV function is moderately reduced.Continue lisinopril which was added recently. I will try low-dose Toprol at 12.5 mg daily. We are limited somewhat with medication doses because of borderline blood pressure. Note previous catheterization did not reveal any artery disease and previous TSH normal. No history of alcohol abuse.

## 2015-10-22 NOTE — Assessment & Plan Note (Signed)
Continue statin. 

## 2015-10-22 NOTE — Patient Instructions (Signed)
Medication Instructions:   START METOPROLOL SUCC ER 12.5 MG ONCE DAILY AT BEDTIME= 1/2 OF 25 MG TABLET ONCE DAILY  Follow-Up:  Your physician recommends that you schedule a follow-up appointment in: [place]

## 2015-11-05 ENCOUNTER — Ambulatory Visit: Payer: Medicare Other | Admitting: Cardiology

## 2016-01-20 DIAGNOSIS — Z6821 Body mass index (BMI) 21.0-21.9, adult: Secondary | ICD-10-CM | POA: Diagnosis not present

## 2016-01-20 DIAGNOSIS — Z79890 Hormone replacement therapy (postmenopausal): Secondary | ICD-10-CM | POA: Diagnosis not present

## 2016-01-20 DIAGNOSIS — Z01419 Encounter for gynecological examination (general) (routine) without abnormal findings: Secondary | ICD-10-CM | POA: Diagnosis not present

## 2016-01-20 DIAGNOSIS — Z1389 Encounter for screening for other disorder: Secondary | ICD-10-CM | POA: Diagnosis not present

## 2016-01-20 DIAGNOSIS — M62838 Other muscle spasm: Secondary | ICD-10-CM | POA: Diagnosis not present

## 2016-01-20 DIAGNOSIS — N8110 Cystocele, unspecified: Secondary | ICD-10-CM | POA: Diagnosis not present

## 2016-02-09 NOTE — Progress Notes (Signed)
HPI: FU cardiomyopathy; previously seen for atypical CP; stress echo 9/15 showed baseline EF 45; inferior, septal and apical HK both at rest and with stress. Cardiac catheterization September 2015 showed normal coronary arteries. Echocardiogram repeated April 2017 and showed ejection fraction [id number] grade 1 diastolic dysfunction. Since last seen, the patient denies any dyspnea on exertion, orthopnea, PND, pedal edema, palpitations, syncope or chest pain. She recently lost her daughter is appropriately upset.  Current Outpatient Prescriptions  Medication Sig Dispense Refill  . ALPRAZolam (XANAX) 0.5 MG tablet Take 1 tablet (0.5 mg total) by mouth 3 (three) times daily as needed for anxiety. 90 tablet 0  . aspirin 81 MG tablet Take 81 mg by mouth daily.      Marland Kitchen atorvastatin (LIPITOR) 20 MG tablet Take 0.5 tablets (10 mg total) by mouth daily. 45 tablet 1  . Cholecalciferol (VITAMIN D) 1000 UNITS capsule Take 1,000 Units by mouth daily.      . cimetidine (TAGAMET) 200 MG tablet Take 1 tablet (200 mg total) by mouth daily. 90 tablet 1  . Coconut Oil 1000 MG CAPS Take 1,000 mg by mouth daily.     . Coenzyme Q10 (COQ10) 100 MG CAPS Take 1 capsule by mouth daily.     . cyclobenzaprine (FLEXERIL) 10 MG tablet Take 1 tablet (10 mg total) by mouth daily. 90 tablet 1  . estradiol (ESTRACE) 0.5 MG tablet Take 0.25 mg by mouth daily.     Marland Kitchen levothyroxine (SYNTHROID, LEVOTHROID) 75 MCG tablet Take 1 tablet (75 mcg total) by mouth daily. 90 tablet 1  . lisinopril (PRINIVIL,ZESTRIL) 2.5 MG tablet Take 1 tablet (2.5 mg total) by mouth daily. 30 tablet 12  . metoprolol succinate (TOPROL XL) 25 MG 24 hr tablet Take 0.5 tablets (12.5 mg total) by mouth at bedtime. 45 tablet 3  . NON FORMULARY Take 2 tablets by mouth daily. Bone-Up: Calcium Supplement    . vitamin C (ASCORBIC ACID) 500 MG tablet Take 1,000 mg by mouth daily.     . vitamin E 400 UNIT capsule Take 400 Units by mouth daily.       No current  facility-administered medications for this visit.      Past Medical History:  Diagnosis Date  . Acute asthmatic bronchitis   . Anxiety   . Fibromyalgia   . Hypercholesterolemia   . Hypothyroidism   . Interstitial cystitis     Past Surgical History:  Procedure Laterality Date  . LEFT HEART CATHETERIZATION WITH CORONARY ANGIOGRAM Bilateral 03/13/2014   Procedure: LEFT HEART CATHETERIZATION WITH CORONARY ANGIOGRAM;  Surgeon: Blane Ohara, MD;  Location: [facility] CATH LAB;  Service: Cardiovascular;  Laterality: Bilateral;  . TOTAL ABDOMINAL HYSTERECTOMY  1997    Social History   Social History  . Marital status: Divorced    Spouse name: N/A  . Number of children: 3  . Years of education: N/A   Occupational History  . school teacher - substitute teaching now    Social History Main Topics  . Smoking status: Never Smoker  . Smokeless tobacco: Never Used  . Alcohol use Yes     Comment: Rare  . Drug use: No  . Sexual activity: Not on file   Other Topics Concern  . Not on file   Social History Narrative   Plays piano, 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   Declines flu and pneumonia shot 04-30-10    Family History  Problem Relation Age of Onset  . Other Mother  bronchiectasis  . Pneumonia Mother   . Heart failure Mother   . Alzheimer's disease Father   . Stroke Father   . Heart failure Father   . Pancreatic cancer Brother   . Hyperlipidemia Sister   . Other Sister     bronchiectasis    ROS: no fevers or chills, productive cough, hemoptysis, dysphasia, odynophagia, melena, hematochezia, dysuria, hematuria, rash, seizure activity, orthopnea, PND, pedal edema, claudication. Remaining systems are negative.  Physical Exam: Well-developed well-nourished in no acute distress.  Skin is warm and dry.  HEENT is normal.  Neck is supple.  Chest is clear to auscultation with normal expansion.  Cardiovascular exam is regular rate and rhythm.  Abdominal exam nontender or distended. No  masses palpated. Extremities show no edema. neuro grossly intact   A/P  1 Nonischemic cardiomyopathy-continue Toprol and lisinopril. I cannot advance medications due to borderline blood pressure. Etiology of cardiomyopathy is unclear. She does not have coronary disease, history of alcohol abuse, hypertension. Previous TSH normal.  2 hyperlipidemia-continue statin.  Kirk Ruths, MD

## 2016-02-10 ENCOUNTER — Ambulatory Visit (INDEPENDENT_AMBULATORY_CARE_PROVIDER_SITE_OTHER): Payer: Medicare Other | Admitting: Physician Assistant

## 2016-02-10 ENCOUNTER — Encounter: Payer: Self-pay | Admitting: Physician Assistant

## 2016-02-10 VITALS — BP 96/58 | HR 75 | Temp 98.1°F | Resp 16 | Ht 63.0 in | Wt 119.0 lb

## 2016-02-10 DIAGNOSIS — E039 Hypothyroidism, unspecified: Secondary | ICD-10-CM | POA: Diagnosis not present

## 2016-02-10 DIAGNOSIS — E7800 Pure hypercholesterolemia, unspecified: Secondary | ICD-10-CM

## 2016-02-10 DIAGNOSIS — F4320 Adjustment disorder, unspecified: Secondary | ICD-10-CM | POA: Insufficient documentation

## 2016-02-10 DIAGNOSIS — F4321 Adjustment disorder with depressed mood: Secondary | ICD-10-CM | POA: Diagnosis not present

## 2016-02-10 LAB — COMPREHENSIVE METABOLIC PANEL
ALBUMIN: 4.1 g/dL (ref 3.5–5.2)
ALT: 32 U/L (ref 0–35)
AST: 35 U/L (ref 0–37)
Alkaline Phosphatase: 71 U/L (ref 39–117)
BILIRUBIN TOTAL: 0.3 mg/dL (ref 0.2–1.2)
BUN: 15 mg/dL (ref 6–23)
CALCIUM: 9 mg/dL (ref 8.4–10.5)
CHLORIDE: 105 meq/L (ref 96–112)
CO2: 32 meq/L (ref 19–32)
CREATININE: 0.82 mg/dL (ref 0.40–1.20)
GFR: 74.23 mL/min (ref >60.00)
Glucose, Bld: 107 mg/dL — ABNORMAL HIGH (ref 70–99)
Potassium: 4.2 mEq/L (ref 3.5–5.1)
Sodium: 139 mEq/L (ref 135–145)
Total Protein: 6.8 g/dL (ref 6.0–8.3)

## 2016-02-10 LAB — LIPID PANEL
CHOLESTEROL: 130 mg/dL (ref 0–200)
HDL: 48 mg/dL (ref >39.00)
LDL Cholesterol: 64 mg/dL (ref 0–99)
NonHDL: 82.35
TRIGLYCERIDES: 92 mg/dL (ref 0.0–149.0)
Total CHOL/HDL Ratio: 3
VLDL: 18.4 mg/dL (ref 0.0–40.0)

## 2016-02-10 LAB — TSH: TSH: 1.51 u[IU]/mL (ref 0.35–4.50)

## 2016-02-10 MED ORDER — ATORVASTATIN CALCIUM 20 MG PO TABS: 10.0000 mg | ORAL_TABLET | Freq: Every day | ORAL | 1 refills | Status: DC

## 2016-02-10 MED ORDER — LEVOTHYROXINE SODIUM 75 MCG PO TABS: 75.0000 ug | ORAL_TABLET | Freq: Every day | ORAL | 1 refills | Status: DC

## 2016-02-10 MED ORDER — CYCLOBENZAPRINE HCL 10 MG PO TABS: 10.0000 mg | ORAL_TABLET | Freq: Every day | ORAL | 1 refills | Status: DC

## 2016-02-10 MED ORDER — CIMETIDINE 200 MG PO TABS: 200.0000 mg | ORAL_TABLET | Freq: Every day | ORAL | 1 refills | Status: DC

## 2016-02-10 NOTE — Assessment & Plan Note (Signed)
Will repeat TSH level today. Continue current regimen. Will alter dose if indicated by results.

## 2016-02-10 NOTE — Progress Notes (Signed)
Patient presents to clinic today for follow-up of Hypothyroidism and Hyperlipidemia. Patient also with recent loss of her daughter.   Hypothyroidism -- Patient currently on levothyroxine 75 mcg daily. Has been well controlled on this medication for some time. Is due for repeat TSH level.   Hyperlipidemia -- Patient currently on Atorvastatin 20 mg daily. Cholesterol has been well-controlled on this regimen previously. Is taking 81 mg ASA daily. Is staying active and exercising regularly.   Patient recently lost her daughter 2 weeks ago suddenly. Is having to take care of all arrangements and taking care of her grandchildren. Is saddened but feels she is handling things well. Denies anxiety, SI/HI.  Past Medical History:  Diagnosis Date  . Acute asthmatic bronchitis   . Anxiety   . Fibromyalgia   . Hypercholesterolemia   . Hypothyroidism   . Interstitial cystitis     Current Outpatient Prescriptions on File Prior to Visit  Medication Sig Dispense Refill  . ALPRAZolam (XANAX) 0.5 MG tablet Take 1 tablet (0.5 mg total) by mouth 3 (three) times daily as needed for anxiety. 90 tablet 0  . aspirin 81 MG tablet Take 81 mg by mouth daily.      . Cholecalciferol (VITAMIN D) 1000 UNITS capsule Take 1,000 Units by mouth daily.      . Coconut Oil 1000 MG CAPS Take 1,000 mg by mouth daily.     . Coenzyme Q10 (COQ10) 100 MG CAPS Take 1 capsule by mouth daily.     Marland Kitchen estradiol (ESTRACE) 0.5 MG tablet Take 0.25 mg by mouth daily.     . famciclovir (FAMVIR) 500 MG tablet Take 1 tablet (500 mg total) by mouth 2 (two) times daily. 14 tablet 3  . lisinopril (PRINIVIL,ZESTRIL) 2.5 MG tablet Take 1 tablet (2.5 mg total) by mouth daily. 30 tablet 12  . metoprolol succinate (TOPROL XL) 25 MG 24 hr tablet Take 0.5 tablets (12.5 mg total) by mouth at bedtime. 45 tablet 3  . NON FORMULARY Take 2 tablets by mouth daily. Bone-Up: Calcium Supplement    . vitamin C (ASCORBIC ACID) 500 MG tablet Take 1,000 mg by  mouth daily.     . vitamin E 400 UNIT capsule Take 400 Units by mouth daily.       No current facility-administered medications on file prior to visit.     Allergies  Allergen Reactions  . Iodine Anaphylaxis  . Anesthetics, Amide Nausea And Vomiting  . Sulfonamide Derivatives     REACTION: throat swelling and itching  . Valtrex [Valacyclovir Hcl]     "made me deathly sick"    Family History  Problem Relation Age of Onset  . Other Mother     bronchiectasis  . Pneumonia Mother   . Heart failure Mother   . Alzheimer's disease Father   . Stroke Father   . Heart failure Father   . Pancreatic cancer Brother   . Hyperlipidemia Sister   . Other Sister     bronchiectasis    Social History   Social History  . Marital status: Divorced    Spouse name: N/A  . Number of children: 3  . Years of education: N/A   Occupational History  . school teacher - substitute teaching now    Social History Main Topics  . Smoking status: Never Smoker  . Smokeless tobacco: Never Used  . Alcohol use Yes     Comment: Rare  . Drug use: No  . Sexual activity: Not Asked  Other Topics Concern  . None   Social History Narrative   Plays piano, 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   Declines flu and pneumonia shot 04-30-10    Review of Systems - See HPI.  All other ROS are negative.  BP (!) 96/58 (BP Location: Right Arm, Patient Position: Sitting, Cuff Size: Normal)   Pulse 75   Temp 98.1 F (36.7 C) (Oral)   Resp 16   Ht 5\' 3"  (1.6 m)   Wt 119 lb (54 kg)   SpO2 99%   BMI 21.08 kg/m   Physical Exam  Constitutional: She is oriented to person, place, and time and well-developed, well-nourished, and in no distress.  HENT:  Head: Normocephalic and atraumatic.  Eyes: Conjunctivae are normal.  Cardiovascular: Normal rate, regular rhythm, normal heart sounds and intact distal pulses.   Pulmonary/Chest: Effort normal and breath sounds normal. No respiratory distress. She has no wheezes. She has no  rales. She exhibits no tenderness.  Neurological: She is alert and oriented to person, place, and time.  Skin: Skin is warm and dry. No rash noted.  Psychiatric: Affect normal.  Vitals reviewed.  Assessment/Plan: Hypothyroidism Will repeat TSH level today. Continue current regimen. Will alter dose if indicated by results.  HYPERCHOLESTEROLEMIA Will obtain fasting lipid panel today. Continue statin and 81 mg ASA daily. Continue diet and exercise regimen.  Grief reaction Patient doing very well considering. Discussed grief stages. Discussed [place] counseling services with patient.     Leeanne Rio, PA-C

## 2016-02-10 NOTE — Patient Instructions (Signed)
Please go to the lab for blood work. I will call you with your results.  Please continue medications as directed. I have sent in refills.

## 2016-02-10 NOTE — Assessment & Plan Note (Signed)
Patient doing very well considering. Discussed grief stages. Discussed [place] counseling services with patient.

## 2016-02-10 NOTE — Assessment & Plan Note (Signed)
Will obtain fasting lipid panel today. Continue statin and 81 mg ASA daily. Continue diet and exercise regimen.

## 2016-02-11 ENCOUNTER — Encounter: Payer: Self-pay | Admitting: Cardiology

## 2016-02-11 ENCOUNTER — Ambulatory Visit (INDEPENDENT_AMBULATORY_CARE_PROVIDER_SITE_OTHER): Payer: Medicare Other | Admitting: Cardiology

## 2016-02-11 VITALS — BP 101/56 | HR 71 | Ht 63.0 in | Wt 118.4 lb

## 2016-02-11 DIAGNOSIS — I420 Dilated cardiomyopathy: Secondary | ICD-10-CM | POA: Diagnosis not present

## 2016-02-11 DIAGNOSIS — E785 Hyperlipidemia, unspecified: Secondary | ICD-10-CM | POA: Diagnosis not present

## 2016-02-11 NOTE — Patient Instructions (Signed)
Your physician recommends that you continue on your current medications as directed. Please refer to the Current Medication list given to you today.   Your physician wants you to follow-up in: 6 MONTHS WITH DR CRENSHAW  You will receive a reminder letter in the mail two months in advance. If you don't receive a letter, please call our office to schedule the follow-up appointment.  

## 2016-03-29 ENCOUNTER — Encounter: Payer: Self-pay | Admitting: Family

## 2016-03-29 ENCOUNTER — Ambulatory Visit (INDEPENDENT_AMBULATORY_CARE_PROVIDER_SITE_OTHER): Payer: Medicare Other | Admitting: Family

## 2016-03-29 VITALS — BP 107/48 | HR 76 | Temp 98.3°F | Resp 18 | Ht 63.0 in | Wt 118.8 lb

## 2016-03-29 DIAGNOSIS — N3000 Acute cystitis without hematuria: Secondary | ICD-10-CM

## 2016-03-29 DIAGNOSIS — M545 Low back pain: Secondary | ICD-10-CM

## 2016-03-29 DIAGNOSIS — R21 Rash and other nonspecific skin eruption: Secondary | ICD-10-CM | POA: Diagnosis not present

## 2016-03-29 LAB — POCT URINALYSIS DIPSTICK
BILIRUBIN UA: NEGATIVE
Blood, UA: NEGATIVE
Glucose, UA: NEGATIVE
KETONES UA: NEGATIVE
Leukocytes, UA: NEGATIVE
NITRITE UA: NEGATIVE
PH UA: 6
PROTEIN UA: NEGATIVE
Spec Grav, UA: 1.025
Urobilinogen, UA: NEGATIVE

## 2016-03-29 MED ORDER — VALACYCLOVIR HCL 1 G PO TABS: 1000.0000 mg | ORAL_TABLET | Freq: Three times a day (TID) | ORAL | 1 refills | Status: DC

## 2016-03-29 MED ORDER — CIPROFLOXACIN HCL 250 MG PO TABS: 250.0000 mg | ORAL_TABLET | Freq: Two times a day (BID) | ORAL | 0 refills | Status: DC

## 2016-03-29 NOTE — Progress Notes (Signed)
Subjective:    Patient ID: Jaclyn Bennett, female    DOB: [birth year], 65 y.o.   MRN: [id number]  HPI  Jaclyn Bennett is a 65 yr old female who presents today with chief complaint of chills.  Chills have been present x 2 days and are associated with with back pain.  She reports that she has hx of interstitial cystitis. "I just don't feel good at all. "  Reports normal temp 97.6.  Using generic AZO without significant improvement in her symptoms.  She reports + dysuria.  Denies hematuria.    BP Readings from Last 3 Encounters:  03/29/16 (!) 107/48  02/11/16 (!) 101/56  02/10/16 (!) 96/58   She reports recent increased stress lately. Her daughter passed away 2 months ago. Reports recurrent "shingles" on her left buttock.  Painful rash.  Has used valtrex in the past.   Review of Systems See HPI  Past Medical History:  Diagnosis Date  . Acute asthmatic bronchitis   . Anxiety   . Fibromyalgia   . Hypercholesterolemia   . Hypothyroidism   . Interstitial cystitis      Social History   Social History  . Marital status: Divorced    Spouse name: N/A  . Number of children: 3  . Years of education: N/A   Occupational History  . school teacher - substitute teaching now    Social History Main Topics  . Smoking status: Never Smoker  . Smokeless tobacco: Never Used  . Alcohol use Yes     Comment: Rare  . Drug use: No  . Sexual activity: Not on file   Other Topics Concern  . Not on file   Social History Narrative   Plays piano, 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   Declines flu and pneumonia shot 04-30-10    Past Surgical History:  Procedure Laterality Date  . LEFT HEART CATHETERIZATION WITH CORONARY ANGIOGRAM Bilateral 03/13/2014   Procedure: LEFT HEART CATHETERIZATION WITH CORONARY ANGIOGRAM;  Surgeon: Blane Ohara, MD;  Location: [facility] CATH LAB;  Service: Cardiovascular;  Laterality: Bilateral;  . TOTAL ABDOMINAL HYSTERECTOMY  1997    Family History  Problem Relation Age of Onset  .  Other Mother     bronchiectasis  . Pneumonia Mother   . Heart failure Mother   . Alzheimer's disease Father   . Stroke Father   . Heart failure Father   . Pancreatic cancer Brother   . Hyperlipidemia Sister   . Other Sister     bronchiectasis    Allergies  Allergen Reactions  . Iodine Anaphylaxis  . Anesthetics, Amide Nausea And Vomiting  . Sulfonamide Derivatives     REACTION: throat swelling and itching  . Valtrex [Valacyclovir Hcl]     "made me deathly sick"    Current Outpatient Prescriptions on File Prior to Visit  Medication Sig Dispense Refill  . ALPRAZolam (XANAX) 0.5 MG tablet Take 1 tablet (0.5 mg total) by mouth 3 (three) times daily as needed for anxiety. 90 tablet 0  . aspirin 81 MG tablet Take 81 mg by mouth daily.      Marland Kitchen atorvastatin (LIPITOR) 20 MG tablet Take 0.5 tablets (10 mg total) by mouth daily. 45 tablet 1  . Cholecalciferol (VITAMIN D) 1000 UNITS capsule Take 1,000 Units by mouth daily.      . cimetidine (TAGAMET) 200 MG tablet Take 1 tablet (200 mg total) by mouth daily. 90 tablet 1  . Coconut Oil 1000 MG CAPS Take 1,000 mg by  mouth daily.     . Coenzyme Q10 (COQ10) 100 MG CAPS Take 1 capsule by mouth daily.     . cyclobenzaprine (FLEXERIL) 10 MG tablet Take 1 tablet (10 mg total) by mouth daily. 90 tablet 1  . estradiol (ESTRACE) 0.5 MG tablet Take 0.25 mg by mouth daily.     Marland Kitchen levothyroxine (SYNTHROID, LEVOTHROID) 75 MCG tablet Take 1 tablet (75 mcg total) by mouth daily. 90 tablet 1  . lisinopril (PRINIVIL,ZESTRIL) 2.5 MG tablet Take 1 tablet (2.5 mg total) by mouth daily. 30 tablet 12  . metoprolol succinate (TOPROL XL) 25 MG 24 hr tablet Take 0.5 tablets (12.5 mg total) by mouth at bedtime. 45 tablet 3  . NON FORMULARY Take 2 tablets by mouth daily. Bone-Up: Calcium Supplement    . vitamin C (ASCORBIC ACID) 500 MG tablet Take 1,000 mg by mouth daily.     . vitamin E 400 UNIT capsule Take 400 Units by mouth daily.       No current  facility-administered medications on file prior to visit.     BP (!) 107/48 (BP Location: Right Arm, Patient Position: Sitting, Cuff Size: Normal)   Pulse 76   Temp 98.3 F (36.8 C) (Oral)   Resp 18   Ht 5\' 3"  (1.6 m)   Wt 118 lb 12.8 oz (53.9 kg)   SpO2 98%   BMI 21.04 kg/m       Objective:   Physical Exam  Constitutional: She is oriented to person, place, and time. She appears well-developed and well-nourished.  HENT:  Head: Normocephalic and atraumatic.  Cardiovascular: Normal rate, regular rhythm and normal heart sounds.   No murmur heard. Pulmonary/Chest: Effort normal and breath sounds normal. No respiratory distress. She has no wheezes.  Abdominal: Soft. She exhibits no distension. There is no tenderness. There is no rebound and no guarding.  Genitourinary:  Genitourinary Comments: Mild CVAT bilaterally  Musculoskeletal: She exhibits no edema.  Neurological: She is alert and oriented to person, place, and time.  Skin: Skin is warm and dry.  Psychiatric: She has a normal mood and affect. Her behavior is normal. Judgment and thought content normal.          Assessment & Plan:  UTI- UA unremarkable, but pt is symptomatic. Will rx with cipro. Send urine for culture to confirm.   Recurrent rash- pt reports recurrent "shingles rash" on her buttock and requests valtrex refill to have on hand. I provided pt with a refill, however I suspect that rash is most likely HSV2.  Will check HSV titers.  I did also advise pt to check with her insurance re: coverage for zostavax

## 2016-03-29 NOTE — Patient Instructions (Addendum)
We will contact you about the results of your urine culture. Please begin cipro (antibiotic) for possible Urinary tract infection. Complete lab work prior to leaving. Call if new/worsening symptoms or if symptoms are not improved in 3 days.

## 2016-03-29 NOTE — Progress Notes (Signed)
Pre visit review using our clinic review tool, if applicable. No additional management support is needed unless otherwise documented below in the visit note. 

## 2016-03-29 NOTE — Addendum Note (Signed)
Addended by: Naaman Plummer A on: 03/29/2016 04:16 PM   Modules accepted: Orders

## 2016-03-30 ENCOUNTER — Encounter: Payer: Self-pay | Admitting: Family

## 2016-03-30 ENCOUNTER — Telehealth: Payer: Self-pay | Admitting: Family

## 2016-03-30 DIAGNOSIS — B009 Herpesviral infection, unspecified: Secondary | ICD-10-CM

## 2016-03-30 HISTORY — DX: Herpesviral infection, unspecified: B00.9

## 2016-03-30 LAB — HSV 2 ANTIBODY, IGG: HSV 2 GLYCOPROTEIN G AB, IGG: 10.1 {index} — AB (ref <0.90)

## 2016-03-30 LAB — URINE CULTURE

## 2016-03-30 LAB — HSV 1 ANTIBODY, IGG: HSV 1 Glycoprotein G Ab, IgG: 18 Index — ABNORMAL HIGH (ref <0.90)

## 2016-03-30 MED ORDER — VALACYCLOVIR HCL 500 MG PO TABS: 500.0000 mg | ORAL_TABLET | Freq: Every day | ORAL | 5 refills | Status: DC

## 2016-03-30 NOTE — Telephone Encounter (Signed)
Please let patient know that her lab work confirms herpes type 1 (oral) and herpes type 2 (genital).  I would recommend that she discard the rx I gave her for valtrex and instead, fill out the rx for valtrex that I am sending to her pharmacy. She should take once daily to prevent flares.

## 2016-03-30 NOTE — Telephone Encounter (Signed)
Notified pt and she voices understanding. 

## 2016-03-31 ENCOUNTER — Telehealth: Payer: Self-pay | Admitting: Family

## 2016-03-31 ENCOUNTER — Telehealth: Payer: Self-pay | Admitting: Physician Assistant

## 2016-03-31 MED ORDER — FAMCICLOVIR 500 MG PO TABS: 500.0000 mg | ORAL_TABLET | Freq: Three times a day (TID) | ORAL | 0 refills | Status: DC

## 2016-03-31 NOTE — Telephone Encounter (Signed)
Patient has been notified about rx

## 2016-03-31 NOTE — Telephone Encounter (Signed)
Patient has been notified she voices understanding.  PC

## 2016-03-31 NOTE — Telephone Encounter (Signed)
Patient tolerated Famvir well. I have sent in a prescription for her to take instead.

## 2016-03-31 NOTE — Telephone Encounter (Signed)
Urine culture did not grow a significant amount of bacteria. She should stop cipro after 3 days.

## 2016-03-31 NOTE — Telephone Encounter (Signed)
Please advise to message below [facility]

## 2016-03-31 NOTE — Telephone Encounter (Signed)
°  Relation to pt: self  Call back number: [phone] or [phone] Pharmacy:    [facility], [place] - [phone] ([phone] (Fax)     Reason for call:  Patient states she's allergic to valACYclovir (VALTREX) 500 MG tablet, please advise

## 2016-04-07 ENCOUNTER — Telehealth: Payer: Self-pay | Admitting: Physician Assistant

## 2016-04-07 NOTE — Telephone Encounter (Signed)
Spoke with pt. She states that she is highly allergic to valacyclovir that she was originally prescribed. [facility] sent Famvir for her instead. Pt wants to know if Famvir should be once daily every day like the valacyclovir was? Rx sent was for 1 tablet three times daily for 7 days. Please advise.

## 2016-04-07 NOTE — Telephone Encounter (Signed)
Spoke with pt. Advised her to cut the current tabs in half and take 1/2 tab twice daily for HSV suppression. When that rx runs out she can pick up the 250mg  tabs bid. She reports that she is tolerating famvir without side effects.  Had severe nausea on valtrex. Allergies updated.

## 2016-04-07 NOTE — Telephone Encounter (Signed)
Pt says that it is very complicated but provider was suppose to send in a medication for her. She says that she's not sure of the name of the medication. She would like to have a call back to discuss directly when possible.    CB: [id number]

## 2016-04-09 MED ORDER — FAMCICLOVIR 250 MG PO TABS: 250.0000 mg | ORAL_TABLET | Freq: Two times a day (BID) | ORAL | 5 refills | Status: DC

## 2016-04-09 NOTE — Addendum Note (Signed)
Addended by: Kelle Darting A on: 04/09/2016 02:25 PM   Modules accepted: Orders

## 2016-04-09 NOTE — Telephone Encounter (Signed)
Received fax from [facility] requesting refill of 500mg  three times a day. Gave verbal to [place] per below instructions for 250mg  twice a day, #60 x 5 refills.

## 2016-05-11 DIAGNOSIS — H2513 Age-related nuclear cataract, bilateral: Secondary | ICD-10-CM | POA: Diagnosis not present

## 2016-06-17 ENCOUNTER — Other Ambulatory Visit: Payer: Self-pay | Admitting: Emergency Medicine

## 2016-06-17 NOTE — Telephone Encounter (Signed)
She should not be due for another month based on last Rx being 3 month supply with 1 refill.

## 2016-06-17 NOTE — Telephone Encounter (Signed)
Which medication is she requesting refill of?

## 2016-06-17 NOTE — Telephone Encounter (Signed)
Last filled 02/10/16 #90 1 Last ov: 02/10/16 Please advise of refill

## 2016-06-17 NOTE — Telephone Encounter (Signed)
Sorry its for the [employer]

## 2016-06-23 ENCOUNTER — Other Ambulatory Visit: Payer: Self-pay | Admitting: Obstetrics and Gynecology

## 2016-06-23 ENCOUNTER — Telehealth: Payer: Self-pay | Admitting: Physician Assistant

## 2016-06-23 DIAGNOSIS — E2839 Other primary ovarian failure: Secondary | ICD-10-CM

## 2016-06-23 DIAGNOSIS — Z1231 Encounter for screening mammogram for malignant neoplasm of breast: Secondary | ICD-10-CM

## 2016-06-23 NOTE — Telephone Encounter (Signed)
Pt states that [facility] [phone]) has to have prior auth before pt can get anymore refills on FLEXERIL, Pt states that she was able to get a 30 day supply for this month.

## 2016-06-24 NOTE — Telephone Encounter (Signed)
Will start on PA for the Flexeril on tomorrow.

## 2016-06-28 ENCOUNTER — Telehealth: Payer: Self-pay | Admitting: Cardiology

## 2016-06-28 NOTE — Telephone Encounter (Signed)
Spoke with pt states that she is having claustrophobia and chest pressure at night it has happened several times in the last few weeks. No chest pain right now or yesterday but states that Saturday is when this episode happened. She states that it comes and goes but when it happens it lasts for most of the night and she feels very fatigued the next day. Pt declines to go to the ER sine it happens only at night and "just a couple times last week and would like an appt instead" appointment made for pt and informed pt to go to the ER if symptoms come back or worsen, if pt experiences chest pain,numbness or tingling if face or extremities. Pt verbalizes understanding.

## 2016-06-28 NOTE — Telephone Encounter (Signed)
Pt calling with feeling of claustrophobia at night, has a heaviness in chest-some chest  pain Friday off and on-has stopped, lightheaded and dizzy, sister is a nurse and told her she may have PND -denies coughing and SOB-also told her she may have CHF pt also denies Edema-pls advise  [phone]

## 2016-07-01 ENCOUNTER — Ambulatory Visit: Payer: Medicare Other | Admitting: Nurse Practitioner

## 2016-07-05 ENCOUNTER — Telehealth: Payer: Self-pay | Admitting: Physician Assistant

## 2016-07-05 NOTE — Progress Notes (Signed)
HPI:  FU cardiomyopathy; previously seen for atypical CP; stress echo 9/15 showed baseline EF 45; inferior, septal and apical HK both at rest and with stress. Cardiac catheterization September 2015 showed normal coronary arteries. Echocardiogram repeated April 2017 and showed ejection fraction [id number] grade 1 diastolic dysfunction. Since last seen, patient has had episodes where she feels dyspneic at night. She goes to the bathroom and then lays down and becomes severely dyspneic causing her to get up. She otherwise denies orthopnea, dyspnea on exertion, pedal edema or syncope. She occasionally has chest pain which is long-standing.  Current Outpatient Prescriptions  Medication Sig Dispense Refill  . ALPRAZolam (XANAX) 0.5 MG tablet Take 1 tablet (0.5 mg total) by mouth 3 (three) times daily as needed for anxiety. 90 tablet 0  . aspirin 81 MG tablet Take 81 mg by mouth daily.      Marland Kitchen atorvastatin (LIPITOR) 20 MG tablet Take 0.5 tablets (10 mg total) by mouth daily. 45 tablet 1  . Cholecalciferol (VITAMIN D) 1000 UNITS capsule Take 1,000 Units by mouth daily.      . cimetidine (TAGAMET) 200 MG tablet Take 1 tablet (200 mg total) by mouth daily. 90 tablet 1  . Coconut Oil 1000 MG CAPS Take 1,000 mg by mouth daily.     . Coenzyme Q10 (COQ10) 100 MG CAPS Take 1 capsule by mouth daily.     . cyclobenzaprine (FLEXERIL) 10 MG tablet Take 1 tablet (10 mg total) by mouth daily. 30 tablet 0  . estradiol (ESTRACE) 0.5 MG tablet Take 0.25 mg by mouth daily.     . famciclovir (FAMVIR) 250 MG tablet Take 1 tablet (250 mg total) by mouth 2 (two) times daily. 60 tablet 5  . levothyroxine (SYNTHROID, LEVOTHROID) 75 MCG tablet Take 1 tablet (75 mcg total) by mouth daily. 90 tablet 1  . lisinopril (PRINIVIL,ZESTRIL) 2.5 MG tablet Take 1 tablet (2.5 mg total) by mouth daily. 30 tablet 12  . metoprolol succinate (TOPROL XL) 25 MG 24 hr tablet Take 0.5 tablets (12.5 mg total) by mouth at bedtime. 45 tablet 3  .  NON FORMULARY Take 2 tablets by mouth daily. Bone-Up: Calcium Supplement    . vitamin C (ASCORBIC ACID) 500 MG tablet Take 1,000 mg by mouth daily.     . vitamin E 400 UNIT capsule Take 400 Units by mouth daily.       No current facility-administered medications for this visit.      Past Medical History:  Diagnosis Date  . Acute asthmatic bronchitis   . Anxiety   . Fibromyalgia   . Herpes simplex type 2 infection 03/30/2016  . Hypercholesterolemia   . Hypothyroidism   . Interstitial cystitis     Past Surgical History:  Procedure Laterality Date  . LEFT HEART CATHETERIZATION WITH CORONARY ANGIOGRAM Bilateral 03/13/2014   Procedure: LEFT HEART CATHETERIZATION WITH CORONARY ANGIOGRAM;  Surgeon: Blane Ohara, MD;  Location: [facility] CATH LAB;  Service: Cardiovascular;  Laterality: Bilateral;  . TOTAL ABDOMINAL HYSTERECTOMY  1997    Social History   Social History  . Marital status: Divorced    Spouse name: N/A  . Number of children: 3  . Years of education: N/A   Occupational History  . school teacher - substitute teaching now    Social History Main Topics  . Smoking status: Never Smoker  . Smokeless tobacco: Never Used  . Alcohol use Yes     Comment: Rare  . Drug use: No  .  Sexual activity: Not on file   Other Topics Concern  . Not on file   Social History Narrative   Plays piano, 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   Declines flu and pneumonia shot 04-30-10    Family History  Problem Relation Age of Onset  . Other Mother     bronchiectasis  . Pneumonia Mother   . Heart failure Mother   . Alzheimer's disease Father   . Stroke Father   . Heart failure Father   . Pancreatic cancer Brother   . Hyperlipidemia Sister   . Other Sister     bronchiectasis    ROS: no fevers or chills, productive cough, hemoptysis, dysphasia, odynophagia, melena, hematochezia, dysuria, hematuria, rash, seizure activity, orthopnea, PND, pedal edema, claudication. Remaining systems are  negative.  Physical Exam: Well-developed well-nourished in no acute distress. Somewhat anxious Skin is warm and dry.  HEENT is normal.  Neck is supple.  Chest is clear to auscultation with normal expansion.  Cardiovascular exam is regular rate and rhythm.  Abdominal exam nontender or distended. No masses palpated. Extremities show no edema. neuro grossly intact  ECG-Sinus rhythm at a rate of 72. Inferior lateral ST depression. Unchanged compared to 09/11/2015.   A/P  1 Nonischemic cardiomyopathy-continue ACEI; increase Toprol to 25 mg daily to see if she tolerates. She is having symptoms of dyspnea at night. She does not appear to be significantly volume overloaded on examination. I wonder if there may be an anxiety component contributing. I will check lab work today including CBC, potassium, renal function, TSH and BNP. If her BNP is elevated we will consider adding a diuretic but as described previously she does not appear to be volume overloaded. Repeat echocardiogram.  2 hyperlipidemia-continue statin.  Kirk Ruths, MD

## 2016-07-05 NOTE — Telephone Encounter (Signed)
Prior authorization started by phone thru [employer] plus. Is being reviewed by pharmacist which can take up to 72 hours. Reference [id number] Waiting on response from insurance co.

## 2016-07-05 NOTE — Telephone Encounter (Signed)
Patient calling to request refill of cyclobenzaprine (FLEXERIL) 10 MG tablet.  She states she is running low on her meds but she thinks the insurance is going to require a prior authorization.  Patient is concerned that she will run out of medication and she needs it for her fibromyalgia.  She states she is concerned because she spoke with someone at [facility] last week and she thought they were sending the refill request to pcp.  However, when she called back to check status she was informed refill request was never sent and she needed to call the [place] office to request refill since she is [place]'s patien.  Patient states she does not want to come to [place] office as it is too far to drive.  However, she wants to get this refill taken care of before she sets up appointment with another provider at [facility].    Please follow up with patient regarding refill and prior authorization for cyclobenzaprine (FLEXERIL) 10 MG tablet.  If she does not answer phone, please leave message on voicemail.  Tel # [phone]  Pharmacy: [place], [place] - [address] [phone] (Phone) [phone] (Fax)

## 2016-07-06 NOTE — Telephone Encounter (Signed)
Pt called to state that insurance will not cover this Rx and asking that it be sent to [place], pt states she will pay out of pocket for it.

## 2016-07-07 ENCOUNTER — Other Ambulatory Visit: Payer: Self-pay | Admitting: Emergency Medicine

## 2016-07-07 DIAGNOSIS — M797 Fibromyalgia: Secondary | ICD-10-CM

## 2016-07-07 MED ORDER — CYCLOBENZAPRINE HCL 10 MG PO TABS: 10.0000 mg | ORAL_TABLET | Freq: Every day | ORAL | 0 refills | Status: DC

## 2016-07-07 NOTE — Telephone Encounter (Signed)
Prior authorization was denied.  

## 2016-07-07 NOTE — Telephone Encounter (Signed)
Medication should not be due for refill for another month as she was given 90 tablets (taking once per day) on 8/29 with 1 refill. That should be a 6 month supply. Should not be due until the end of February. If she is taking more than directed, she needs follow-up to assess. If she does not want to come to [facility], she can transfer care to another [place] provider and schedule follow-up with them.

## 2016-07-07 NOTE — Telephone Encounter (Signed)
Advised patient will refill 30 of the Flexeril but patient has to make an appointment. She is agreeable and scheduled for 07/20/16 at 3:45. Rx sent to [facility].

## 2016-07-07 NOTE — Telephone Encounter (Signed)
Ok to give her 30 tablets. No further refill from me without appt.

## 2016-07-07 NOTE — Telephone Encounter (Signed)
Spoke with patient advised her that she would need an f/u appt for refill of the Flexeril. Patient states she had medication refilled in Jan and pharmacy only gave her 30 instead of the 90. Patient states she will pay for medication out of pocket since the medication has been working well at 1 tablet at bedtime for her Fibromyalgia. She didn't want to come to the [place] location. She wanted 30 pills of the Flexeril and she states she will schedule an appointment with a provider in [place] after the flu season dies down some.  Please advise. Will call patient back to advise of recommendations.

## 2016-07-07 NOTE — Telephone Encounter (Signed)
Is ok to fill medication? Patient doesn't want to transfer to the [place] location.

## 2016-07-14 ENCOUNTER — Encounter (INDEPENDENT_AMBULATORY_CARE_PROVIDER_SITE_OTHER): Payer: Self-pay

## 2016-07-14 ENCOUNTER — Encounter: Payer: Self-pay | Admitting: Cardiology

## 2016-07-14 ENCOUNTER — Other Ambulatory Visit (INDEPENDENT_AMBULATORY_CARE_PROVIDER_SITE_OTHER): Payer: Medicare Other

## 2016-07-14 ENCOUNTER — Ambulatory Visit (INDEPENDENT_AMBULATORY_CARE_PROVIDER_SITE_OTHER): Payer: Medicare Other | Admitting: Cardiology

## 2016-07-14 VITALS — BP 108/72 | HR 72 | Ht 63.0 in | Wt 119.0 lb

## 2016-07-14 DIAGNOSIS — R0600 Dyspnea, unspecified: Secondary | ICD-10-CM | POA: Diagnosis not present

## 2016-07-14 DIAGNOSIS — E7800 Pure hypercholesterolemia, unspecified: Secondary | ICD-10-CM | POA: Diagnosis not present

## 2016-07-14 DIAGNOSIS — I420 Dilated cardiomyopathy: Secondary | ICD-10-CM

## 2016-07-14 LAB — BASIC METABOLIC PANEL
BUN: 18 mg/dL (ref 7–25)
CALCIUM: 9.1 mg/dL (ref 8.6–10.4)
CO2: 31 mmol/L (ref 20–31)
Chloride: 105 mmol/L (ref 98–110)
Creat: 0.83 mg/dL (ref 0.50–0.99)
Glucose, Bld: 81 mg/dL (ref 65–99)
POTASSIUM: 4 mmol/L (ref 3.5–5.3)
SODIUM: 141 mmol/L (ref 135–146)

## 2016-07-14 LAB — CBC
HCT: 37.4 % (ref 35.0–45.0)
Hemoglobin: 12.5 g/dL (ref 11.7–15.5)
MCH: 31.4 pg (ref 27.0–33.0)
MCHC: 33.4 g/dL (ref 32.0–36.0)
MCV: 94 fL (ref 80.0–100.0)
MPV: 8.5 fL (ref 7.5–12.5)
PLATELETS: 325 10*3/uL (ref 140–400)
RBC: 3.98 MIL/uL (ref 3.80–5.10)
RDW: 12.9 % (ref 11.0–15.0)
WBC: 4.9 10*3/uL (ref 3.8–10.8)

## 2016-07-14 LAB — TSH: TSH: 2.01 mIU/L

## 2016-07-14 MED ORDER — METOPROLOL SUCCINATE ER 25 MG PO TB24: 25.0000 mg | ORAL_TABLET | Freq: Every day | ORAL | 3 refills | Status: DC

## 2016-07-14 NOTE — Patient Instructions (Signed)
Medication Instructions:   INCREASE METOPROLOL TO 25 MG ONCE DAILY= 1 OF THE 25 MG TABLETS ONCE DAILY  Labwork:  Your physician recommends that you HAVE LAB WORK TODAY  Testing/Procedures:  Your physician has requested that you have an echocardiogram. Echocardiography is a painless test that uses sound waves to create images of your heart. It provides your doctor with information about the size and shape of your heart and how well your heart's chambers and valves are working. This procedure takes approximately one hour. There are no restrictions for this procedure.    Follow-Up:  Your physician recommends that you schedule a follow-up appointment in: [place]

## 2016-07-15 LAB — BRAIN NATRIURETIC PEPTIDE: Brain Natriuretic Peptide: 13.6 pg/mL (ref <100)

## 2016-07-20 ENCOUNTER — Ambulatory Visit: Payer: Medicare Other | Admitting: Physician Assistant

## 2016-07-21 ENCOUNTER — Ambulatory Visit (HOSPITAL_BASED_OUTPATIENT_CLINIC_OR_DEPARTMENT_OTHER)
Admission: RE | Admit: 2016-07-21 | Discharge: 2016-07-21 | Disposition: A | Discharge status: Home / Self Care | Payer: Medicare Other | Source: Ambulatory Visit | Attending: Nurse Practitioner | Admitting: Nurse Practitioner

## 2016-07-21 DIAGNOSIS — I501 Left ventricular failure: Secondary | ICD-10-CM | POA: Diagnosis not present

## 2016-07-21 DIAGNOSIS — R9439 Abnormal result of other cardiovascular function study: Secondary | ICD-10-CM | POA: Insufficient documentation

## 2016-07-21 DIAGNOSIS — I420 Dilated cardiomyopathy: Secondary | ICD-10-CM | POA: Diagnosis not present

## 2016-07-21 DIAGNOSIS — I340 Nonrheumatic mitral (valve) insufficiency: Secondary | ICD-10-CM | POA: Diagnosis not present

## 2016-07-21 LAB — ECHOCARDIOGRAM COMPLETE
E/e' ratio: 8.76
EWDT: 120 ms
FS: 24 % — AB (ref 28–44)
IV/PV OW: 0.76
LA diam end sys: 30 mm
LA vol index: 23.9 mL/m2
LA vol: 37.1 mL
LADIAMINDEX: 1.94 cm/m2
LASIZE: 30 mm
LAVOLA4C: 31.2 mL
LV E/e' medial: 8.76
LV PW d: 9.04 mm — AB (ref 0.6–1.1)
LV TDI E'LATERAL: 9.03
LV TDI E'MEDIAL: 6.09
LV dias vol index: 29 mL/m2
LV e' LATERAL: 9.03 cm/s
LVDIAVOL: 46 mL (ref 46–106)
LVEEAVG: 8.76
LVOT SV: 51 mL
LVOT VTI: 17.8 cm
LVOT area: 2.84 cm2
LVOT peak vel: 87.9 cm/s
LVOTD: 19 mm
LVSYSVOL: 27 mL (ref 14–42)
LVSYSVOLIN: 17 mL/m2
Lateral S' vel: 12.5 cm/s
MV Dec: 120
MV Peak grad: 3 mmHg
MVPKAVEL: 85.9 m/s
MVPKEVEL: 79.1 m/s
PV Reg vel dias: 86.9 cm/s
Simpson's disk: 42
Stroke v: 19 ml

## 2016-07-21 NOTE — Progress Notes (Signed)
  Echocardiogram 2D Echocardiogram has been performed.  Tresa Res 07/21/2016, 3:00 PM

## 2016-08-09 ENCOUNTER — Ambulatory Visit (INDEPENDENT_AMBULATORY_CARE_PROVIDER_SITE_OTHER): Payer: Medicare Other | Admitting: Physician Assistant

## 2016-08-09 ENCOUNTER — Encounter: Payer: Self-pay | Admitting: Physician Assistant

## 2016-08-09 VITALS — BP 108/60 | HR 73 | Temp 98.1°F | Resp 14 | Ht 63.0 in | Wt 120.0 lb

## 2016-08-09 DIAGNOSIS — I420 Dilated cardiomyopathy: Secondary | ICD-10-CM | POA: Diagnosis not present

## 2016-08-09 DIAGNOSIS — N3010 Interstitial cystitis (chronic) without hematuria: Secondary | ICD-10-CM

## 2016-08-09 DIAGNOSIS — E7800 Pure hypercholesterolemia, unspecified: Secondary | ICD-10-CM | POA: Diagnosis not present

## 2016-08-09 DIAGNOSIS — E039 Hypothyroidism, unspecified: Secondary | ICD-10-CM | POA: Diagnosis not present

## 2016-08-09 DIAGNOSIS — M797 Fibromyalgia: Secondary | ICD-10-CM | POA: Diagnosis not present

## 2016-08-09 MED ORDER — CIMETIDINE 200 MG PO TABS: 100.0000 mg | ORAL_TABLET | Freq: Every day | ORAL | 1 refills | Status: DC

## 2016-08-09 MED ORDER — ATORVASTATIN CALCIUM 20 MG PO TABS: 10.0000 mg | ORAL_TABLET | Freq: Every day | ORAL | 1 refills | Status: DC

## 2016-08-09 MED ORDER — FAMCICLOVIR 250 MG PO TABS: 250.0000 mg | ORAL_TABLET | Freq: Two times a day (BID) | ORAL | 5 refills | Status: DC

## 2016-08-09 MED ORDER — LEVOTHYROXINE SODIUM 75 MCG PO TABS: 75.0000 ug | ORAL_TABLET | Freq: Every day | ORAL | 1 refills | Status: DC

## 2016-08-09 NOTE — Patient Instructions (Signed)
Please go to the lab for blood work.  I will call you with your results.  Please continue medications with the following exceptions: Take 1/2 tablet of the Flexeril instead of a whole table.  Please follow-up with Dr. Stanford Breed [id number] as scheduled to discuss BP medication regimen.

## 2016-08-09 NOTE — Progress Notes (Signed)
HPI: FU cardiomyopathy; previously seen for atypical CP; stress echo 9/15 showed baseline EF 45; inferior, septal and apical HK both at rest and with stress. Cardiac catheterization September 2015 showed normal coronary arteries. Laboratories January 2018 showed BNP 13.6, normal TSH and normal hemoglobin. Echocardiogram repeated 2/18 and showed ejection fraction [id number], grade 1 diastolic dysfunction.  At last office visit patient was complaining of some dyspnea. Since last seen, she denies dyspnea, chest pain, palpitations or syncope.  Current Outpatient Prescriptions  Medication Sig Dispense Refill  . aspirin 81 MG tablet Take 81 mg by mouth daily.      Marland Kitchen atorvastatin (LIPITOR) 20 MG tablet Take 0.5 tablets (10 mg total) by mouth daily. 45 tablet 1  . Cholecalciferol (VITAMIN D) 1000 UNITS capsule Take 1,000 Units by mouth daily.      . cimetidine (TAGAMET) 200 MG tablet Take 0.5 tablets (100 mg total) by mouth daily. 45 tablet 1  . Coconut Oil 1000 MG CAPS Take 1,000 mg by mouth daily.     . Coenzyme Q10 (COQ10) 100 MG CAPS Take 1 capsule by mouth daily.     Marland Kitchen estradiol (ESTRACE) 0.5 MG tablet Take 0.25 mg by mouth daily.     . famciclovir (FAMVIR) 250 MG tablet Take 1 tablet (250 mg total) by mouth 2 (two) times daily. 60 tablet 5  . levothyroxine (SYNTHROID, LEVOTHROID) 75 MCG tablet Take 1 tablet (75 mcg total) by mouth daily. 90 tablet 1  . lisinopril (PRINIVIL,ZESTRIL) 2.5 MG tablet Take 1 tablet (2.5 mg total) by mouth daily. 30 tablet 12  . metoprolol succinate (TOPROL XL) 25 MG 24 hr tablet Take 1 tablet (25 mg total) by mouth at bedtime. 90 tablet 3  . NON FORMULARY Take 2 tablets by mouth daily. Bone-Up: Calcium Supplement    . vitamin C (ASCORBIC ACID) 500 MG tablet Take 1,000 mg by mouth daily.     . vitamin E 400 UNIT capsule Take 400 Units by mouth daily.       No current facility-administered medications for this visit.      Past Medical History:  Diagnosis Date  .  Acute asthmatic bronchitis   . Anxiety   . Fibromyalgia   . Herpes simplex type 2 infection 03/30/2016  . Hypercholesterolemia   . Hypothyroidism   . Interstitial cystitis     Past Surgical History:  Procedure Laterality Date  . LEFT HEART CATHETERIZATION WITH CORONARY ANGIOGRAM Bilateral 03/13/2014   Procedure: LEFT HEART CATHETERIZATION WITH CORONARY ANGIOGRAM;  Surgeon: Blane Ohara, MD;  Location: [facility] CATH LAB;  Service: Cardiovascular;  Laterality: Bilateral;  . TOTAL ABDOMINAL HYSTERECTOMY  1997    Social History   Social History  . Marital status: Divorced    Spouse name: N/A  . Number of children: 3  . Years of education: N/A   Occupational History  . school teacher - substitute teaching now    Social History Main Topics  . Smoking status: Never Smoker  . Smokeless tobacco: Never Used  . Alcohol use Yes     Comment: Rare  . Drug use: No  . Sexual activity: Not on file   Other Topics Concern  . Not on file   Social History Narrative   Plays piano, 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   Declines flu and pneumonia shot 04-30-10    Family History  Problem Relation Age of Onset  . Other Mother     bronchiectasis  . Pneumonia Mother   .  Heart failure Mother   . Alzheimer's disease Father   . Stroke Father   . Heart failure Father   . Pancreatic cancer Brother   . Hyperlipidemia Sister   . Other Sister     bronchiectasis    ROS: no fevers or chills, productive cough, hemoptysis, dysphasia, odynophagia, melena, hematochezia, dysuria, hematuria, rash, seizure activity, orthopnea, PND, pedal edema, claudication. Remaining systems are negative.  Physical Exam: Well-developed well-nourished in no acute distress.  Skin is warm and dry.  HEENT is normal.  Neck is supple.  Chest is clear to auscultation with normal expansion.  Cardiovascular exam is regular rate and rhythm.  Abdominal exam nontender or distended. No masses palpated. Extremities show no edema. neuro  grossly intact  A/P  1 Nonischemic cardiomyopathy-LV function remains mildly reduced and unchanged. Continue ACE inhibitor and beta blocker. She is not volume overloaded on examination. She was complaining of dyspnea but BNP is normal. Her symptoms have resolved. We will not pursue further evaluation at this point.   2 hyperlipidemia-continue statin.  Kirk Ruths, MD

## 2016-08-09 NOTE — Progress Notes (Signed)
Pre visit review using our clinic review tool, if applicable. No additional management support is needed unless otherwise documented below in the visit note. 

## 2016-08-10 LAB — COMPREHENSIVE METABOLIC PANEL
ALT: 27 U/L (ref 0–35)
AST: 24 U/L (ref 0–37)
Albumin: 4.3 g/dL (ref 3.5–5.2)
Alkaline Phosphatase: 81 U/L (ref 39–117)
BUN: 19 mg/dL (ref 6–23)
CO2: 32 meq/L (ref 19–32)
Calcium: 9.6 mg/dL (ref 8.4–10.5)
Chloride: 102 mEq/L (ref 96–112)
Creatinine, Ser: 0.9 mg/dL (ref 0.40–1.20)
GFR: 66.57 mL/min (ref >60.00)
GLUCOSE: 104 mg/dL — AB (ref 70–99)
POTASSIUM: 4.2 meq/L (ref 3.5–5.1)
Sodium: 139 mEq/L (ref 135–145)
Total Bilirubin: 0.3 mg/dL (ref 0.2–1.2)
Total Protein: 6.7 g/dL (ref 6.0–8.3)

## 2016-08-10 LAB — LIPID PANEL
Cholesterol: 182 mg/dL (ref 0–200)
HDL: 45.5 mg/dL (ref >39.00)
NONHDL: 136.57
TRIGLYCERIDES: 314 mg/dL — AB (ref 0.0–149.0)
Total CHOL/HDL Ratio: 4
VLDL: 62.8 mg/dL — AB (ref 0.0–40.0)

## 2016-08-10 LAB — LDL CHOLESTEROL, DIRECT: LDL DIRECT: 92 mg/dL

## 2016-08-11 ENCOUNTER — Ambulatory Visit (INDEPENDENT_AMBULATORY_CARE_PROVIDER_SITE_OTHER): Payer: Medicare Other | Admitting: Cardiology

## 2016-08-11 ENCOUNTER — Encounter: Payer: Self-pay | Admitting: Cardiology

## 2016-08-11 VITALS — BP 116/74 | HR 76 | Ht 63.0 in | Wt 119.8 lb

## 2016-08-11 DIAGNOSIS — E7800 Pure hypercholesterolemia, unspecified: Secondary | ICD-10-CM | POA: Diagnosis not present

## 2016-08-11 DIAGNOSIS — I420 Dilated cardiomyopathy: Secondary | ICD-10-CM | POA: Diagnosis not present

## 2016-08-11 NOTE — Patient Instructions (Signed)
Your physician wants you to follow-up in: 6 MONTHS WITH DR CRENSHAW You will receive a reminder letter in the mail two months in advance. If you don't receive a letter, please call our office to schedule the follow-up appointment.   If you need a refill on your cardiac medications before your next appointment, please call your pharmacy.  

## 2016-08-12 ENCOUNTER — Ambulatory Visit
Admission: RE | Admit: 2016-08-12 | Discharge: 2016-08-12 | Disposition: A | Discharge status: Home / Self Care | Payer: Medicare Other | Source: Ambulatory Visit | Attending: Obstetrics and Gynecology | Admitting: Obstetrics and Gynecology

## 2016-08-12 ENCOUNTER — Other Ambulatory Visit (INDEPENDENT_AMBULATORY_CARE_PROVIDER_SITE_OTHER): Payer: Medicare Other

## 2016-08-12 DIAGNOSIS — R7309 Other abnormal glucose: Secondary | ICD-10-CM

## 2016-08-12 DIAGNOSIS — M85851 Other specified disorders of bone density and structure, right thigh: Secondary | ICD-10-CM | POA: Diagnosis not present

## 2016-08-12 DIAGNOSIS — Z1231 Encounter for screening mammogram for malignant neoplasm of breast: Secondary | ICD-10-CM | POA: Diagnosis not present

## 2016-08-12 DIAGNOSIS — E2839 Other primary ovarian failure: Secondary | ICD-10-CM

## 2016-08-12 LAB — HEMOGLOBIN A1C: HEMOGLOBIN A1C: 5.8 % (ref 4.6–6.5)

## 2016-08-13 ENCOUNTER — Other Ambulatory Visit: Payer: Self-pay | Admitting: Obstetrics and Gynecology

## 2016-08-13 ENCOUNTER — Other Ambulatory Visit: Payer: Self-pay | Admitting: Physician Assistant

## 2016-08-13 DIAGNOSIS — R928 Other abnormal and inconclusive findings on diagnostic imaging of breast: Secondary | ICD-10-CM

## 2016-08-13 DIAGNOSIS — E782 Mixed hyperlipidemia: Secondary | ICD-10-CM

## 2016-08-15 NOTE — Progress Notes (Signed)
Patient presents to clinic today for follow-up for chronic medical conditions.   Hypothyroidism -- Patient currently on levothyroxine 75 mcg daily. Is taking as directed. Denies change in mentation, mood, bowels or energy levels.  Lab Results  Component Value Date   TSH 2.01 07/14/2016   Fibromyalgia -- Currently taking Flexeril at nighttime to help with muscular tension and pain that is worse at night. Doing very well on this regimen without side effects. .  Interstitial Cystitis -- Taking Cimetidine 100 mg QD which is adequately controlling symptoms. Has learned to avoid her trigger foods. No symptoms at present.   HSV II Infection -- Taking Famvir as directed. No recent outbreaks. .  Past Medical History:  Diagnosis Date  . Acute asthmatic bronchitis   . Anxiety   . Fibromyalgia   . Herpes simplex type 2 infection 03/30/2016  . Hypercholesterolemia   . Hypothyroidism   . Interstitial cystitis     Current Outpatient Prescriptions on File Prior to Visit  Medication Sig Dispense Refill  . aspirin 81 MG tablet Take 81 mg by mouth daily.      . Cholecalciferol (VITAMIN D) 1000 UNITS capsule Take 1,000 Units by mouth daily.      . Coconut Oil 1000 MG CAPS Take 1,000 mg by mouth daily.     . Coenzyme Q10 (COQ10) 100 MG CAPS Take 1 capsule by mouth daily.     Marland Kitchen estradiol (ESTRACE) 0.5 MG tablet Take 0.25 mg by mouth daily.     Marland Kitchen lisinopril (PRINIVIL,ZESTRIL) 2.5 MG tablet Take 1 tablet (2.5 mg total) by mouth daily. 30 tablet 12  . metoprolol succinate (TOPROL XL) 25 MG 24 hr tablet Take 1 tablet (25 mg total) by mouth at bedtime. 90 tablet 3  . NON FORMULARY Take 2 tablets by mouth daily. Bone-Up: Calcium Supplement    . vitamin C (ASCORBIC ACID) 500 MG tablet Take 1,000 mg by mouth daily.     . vitamin E 400 UNIT capsule Take 400 Units by mouth daily.       No current facility-administered medications on file prior to visit.     Allergies  Allergen Reactions  . Iodine  Anaphylaxis  . Valtrex [Valacyclovir Hcl] Other (See Comments)    Severe nausea   . Anesthetics, Amide Nausea And Vomiting  . Sulfonamide Derivatives     REACTION: throat swelling and itching    Family History  Problem Relation Age of Onset  . Other Mother     bronchiectasis  . Pneumonia Mother   . Heart failure Mother   . Alzheimer's disease Father   . Stroke Father   . Heart failure Father   . Pancreatic cancer Brother   . Hyperlipidemia Sister   . Other Sister     bronchiectasis    Social History   Social History  . Marital status: Divorced    Spouse name: N/A  . Number of children: 3  . Years of education: N/A   Occupational History  . school teacher - substitute teaching now    Social History Main Topics  . Smoking status: Never Smoker  . Smokeless tobacco: Never Used  . Alcohol use Yes     Comment: Rare  . Drug use: No  . Sexual activity: Not Asked   Other Topics Concern  . None   Social History Narrative   Plays piano, 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   Declines flu and pneumonia shot 04-30-10    Review of Systems - See  HPI.  All other ROS are negative.  BP 108/60   Pulse 73   Temp 98.1 F (36.7 C) (Oral)   Resp 14   Ht 5\' 3"  (1.6 m)   Wt 120 lb (54.4 kg)   SpO2 98%   BMI 21.26 kg/m   Physical Exam  Constitutional: She is oriented to person, place, and time and well-developed, well-nourished, and in no distress.  HENT:  Head: Normocephalic and atraumatic.  Eyes: Conjunctivae are normal. Pupils are equal, round, and reactive to light.  Neck: Neck supple.  Cardiovascular: Normal rate, regular rhythm, normal heart sounds and intact distal pulses.   Pulmonary/Chest: Effort normal and breath sounds normal. No respiratory distress. She has no wheezes. She has no rales. She exhibits no tenderness.  Neurological: She is alert and oriented to person, place, and time.  Skin: Skin is warm and dry. No rash noted.  Psychiatric: Affect normal.  Vitals  reviewed.  Recent Results (from the past 2160 hour(s))  B Nat Peptide     Status: None   Collection Time: 07/14/16 10:56 AM  Result Value Ref Range   Brain Natriuretic Peptide 13.6 <100 pg/mL    Comment:   BNP levels increase with age in the general population with the highest values seen in individuals greater than 51 years of age. Reference: Joellyn Rued Cardiol 2002; [id number].     TSH     Status: None   Collection Time: 07/14/16 10:56 AM  Result Value Ref Range   TSH 2.01 mIU/L    Comment:   Reference Range   > or = 20 Years  0.40-4.50   Pregnancy Range First trimester  0.26-2.66 Second trimester 0.55-2.73 Third trimester  0.43-2.91     CBC     Status: None   Collection Time: 07/14/16 10:56 AM  Result Value Ref Range   WBC 4.9 3.8 - 10.8 K/uL   RBC 3.98 3.80 - 5.10 MIL/uL   Hemoglobin 12.5 11.7 - 15.5 g/dL   HCT 37.4 35.0 - 45.0 %   MCV 94.0 80.0 - 100.0 fL   MCH 31.4 27.0 - 33.0 pg   MCHC 33.4 32.0 - 36.0 g/dL   RDW 12.9 11.0 - 15.0 %   Platelets 325 140 - 400 K/uL   MPV 8.5 7.5 - 12.5 fL  Basic metabolic panel     Status: None   Collection Time: 07/14/16 10:56 AM  Result Value Ref Range   Sodium 141 135 - 146 mmol/L   Potassium 4.0 3.5 - 5.3 mmol/L   Chloride 105 98 - 110 mmol/L   CO2 31 20 - 31 mmol/L   Glucose, Bld 81 65 - 99 mg/dL   BUN 18 7 - 25 mg/dL   Creat 0.83 0.50 - 0.99 mg/dL    Comment:   For patients > or = 66 years of age: The upper reference limit for Creatinine is approximately 13% higher for people identified as African-American.      Calcium 9.1 8.6 - 10.4 mg/dL  ECHOCARDIOGRAM COMPLETE     Status: Abnormal   Collection Time: 07/21/16  2:59 PM  Result Value Ref Range   LV PW d 9.04 (A) 0.6 - 1.1 mm   FS 24 (A) 28 - 44 %   LA vol 37.1 mL   LA ID, A-P, ES 30 mm   IVS/LV PW RATIO, ED .76    Stroke v 19 ml   LVOT VTI 17.8 cm   PV Reg vel dias 86.9  cm/s   LV e' LATERAL 9.03 cm/s   LV E/e' medial 8.76    LV E/e'average 8.76    LA  diam index 1.94 cm/m2   LA vol A4C 31.2 ml   E decel time 120 msec   LVOT diameter 19 mm   LVOT area 2.84 cm2   LVOT peak vel 87.9 cm/s   LVOT SV 51.00 mL   Peak grad 3 mmHg   E/e' ratio 8.76    MV pk E vel 79.1 m/s   MV pk A vel 85.9 m/s   LV sys vol 27 14 - 42 mL   LV sys vol index 17.0 mL/m2   LV dias vol 46 46 - 106 mL   LV dias vol index 29.0 mL/m2   LA vol index 23.9 mL/m2   MV Dec 120    LA diam end sys 30.00 mm   Simpson's disk 42.00    TDI e' medial 6.09    TDI e' lateral 9.03    Lateral S' vel 12.50 cm/sec  Comprehensive metabolic panel     Status: Abnormal   Collection Time: 08/09/16  4:18 PM  Result Value Ref Range   Sodium 139 135 - 145 mEq/L   Potassium 4.2 3.5 - 5.1 mEq/L   Chloride 102 96 - 112 mEq/L   CO2 32 19 - 32 mEq/L   Glucose, Bld 104 (H) 70 - 99 mg/dL   BUN 19 6 - 23 mg/dL   Creatinine, Ser 0.90 0.40 - 1.20 mg/dL   Total Bilirubin 0.3 0.2 - 1.2 mg/dL   Alkaline Phosphatase 81 39 - 117 U/L   AST 24 0 - 37 U/L   ALT 27 0 - 35 U/L   Total Protein 6.7 6.0 - 8.3 g/dL   Albumin 4.3 3.5 - 5.2 g/dL   Calcium 9.6 8.4 - 10.5 mg/dL   GFR 66.57 >60.00 mL/min  Lipid panel     Status: Abnormal   Collection Time: 08/09/16  4:18 PM  Result Value Ref Range   Cholesterol 182 0 - 200 mg/dL    Comment: ATP III Classification       Desirable:  < 200 mg/dL               Borderline High:  200 - 239 mg/dL          High:  > = 240 mg/dL   Triglycerides 314.0 (H) 0.0 - 149.0 mg/dL    Comment: Normal:  <150 mg/dLBorderline High:  150 - 199 mg/dL   HDL 45.50 >39.00 mg/dL   VLDL 62.8 (H) 0.0 - 40.0 mg/dL   Total CHOL/HDL Ratio 4     Comment:                Men          Women1/2 Average Risk     3.4          3.3Average Risk          5.0          4.42X Average Risk          9.6          7.13X Average Risk          15.0          11.0                       NonHDL 136.57     Comment: NOTE:  Non-HDL  goal should be 30 mg/dL higher than patient's LDL goal (i.e. LDL goal of < 70  mg/dL, would have non-HDL goal of < 100 mg/dL)  LDL cholesterol, direct     Status: None   Collection Time: 08/09/16  4:18 PM  Result Value Ref Range   Direct LDL 92.0 mg/dL    Comment: Optimal:  <100 mg/dLNear or Above Optimal:  100-129 mg/dLBorderline High:  130-159 mg/dLHigh:  160-189 mg/dLVery High:  >190 mg/dL  Hemoglobin A1c     Status: None   Collection Time: 08/12/16 12:21 PM  Result Value Ref Range   Hgb A1c MFr Bld 5.8 4.6 - 6.5 %    Comment: Glycemic Control Guidelines for People with Diabetes:Non Diabetic:  <6%Goal of Therapy: <7%Additional Action Suggested:  >8%     Assessment/Plan: 1. Hypothyroidism, unspecified type Patient taking medications as directed. TSH up-to-date and within normal limits. Will check every 6 months.   2. Congestive dilated cardiomyopathy ([place]) Followed by Cardiology. Asymptomatic. Vitals stable.  3. HYPERCHOLESTEROLEMIA Repeat lipids and CMP today. Taking statin as directed. - Comprehensive metabolic panel - Lipid panel  4. INTERSTITIAL CYSTITIS Doing very well. Will continue low-dose Tagamet and avoidance of triggers.   5. Fibromyalgia Continue current regimen. Discussed water aerobics and low impact exercises.    Leeanne Rio, PA-C

## 2016-08-17 ENCOUNTER — Ambulatory Visit
Admission: RE | Admit: 2016-08-17 | Discharge: 2016-08-17 | Disposition: A | Discharge status: Home / Self Care | Payer: Medicare Other | Source: Ambulatory Visit | Attending: Obstetrics and Gynecology | Admitting: Obstetrics and Gynecology

## 2016-08-17 DIAGNOSIS — R928 Other abnormal and inconclusive findings on diagnostic imaging of breast: Secondary | ICD-10-CM

## 2016-08-17 DIAGNOSIS — R922 Inconclusive mammogram: Secondary | ICD-10-CM | POA: Diagnosis not present

## 2016-08-17 DIAGNOSIS — N6012 Diffuse cystic mastopathy of left breast: Secondary | ICD-10-CM | POA: Diagnosis not present

## 2016-08-25 ENCOUNTER — Encounter: Payer: Self-pay | Admitting: Physician Assistant

## 2016-09-14 ENCOUNTER — Other Ambulatory Visit (INDEPENDENT_AMBULATORY_CARE_PROVIDER_SITE_OTHER): Payer: Medicare Other

## 2016-09-14 ENCOUNTER — Encounter: Payer: Self-pay | Admitting: Emergency Medicine

## 2016-09-14 DIAGNOSIS — E782 Mixed hyperlipidemia: Secondary | ICD-10-CM | POA: Diagnosis not present

## 2016-09-14 LAB — TRIGLYCERIDES: Triglycerides: 124 mg/dL (ref 0.0–149.0)

## 2016-09-15 ENCOUNTER — Encounter: Payer: Self-pay | Admitting: Physician Assistant

## 2016-09-15 DIAGNOSIS — M797 Fibromyalgia: Secondary | ICD-10-CM

## 2016-09-15 MED ORDER — CYCLOBENZAPRINE HCL 10 MG PO TABS: 10.0000 mg | ORAL_TABLET | Freq: Every day | ORAL | 5 refills | Status: DC

## 2016-10-20 NOTE — Telephone Encounter (Signed)
Spoke with patient to schedule AWV. Patient requesting that her AWV be scheduled the same day as her next visit with PCP since she has a long drive. Patient states she will call for follow up appointment when needed, reports she has no issues/concerns at this time.

## 2016-10-22 ENCOUNTER — Telehealth: Payer: Self-pay | Admitting: Cardiology

## 2016-10-22 MED ORDER — LISINOPRIL 2.5 MG PO TABS: 2.5000 mg | ORAL_TABLET | Freq: Every day | ORAL | 12 refills | Status: DC

## 2016-10-22 NOTE — Telephone Encounter (Signed)
New message      *STAT* If patient is at the pharmacy, call can be transferred to refill team.   1. Which medications need to be refilled? (please list name of each medication and dose if known)  lisinopril (PRINIVIL,ZESTRIL) 2.5 MG tablet Take 1 tablet (2.5 mg total) by mouth daily.     2. Which pharmacy/location (including street and city if local pharmacy) is medication to be sent to? [facility]  3. Do they need a 30 day or 90 day supply?  90  She is out of her medication

## 2016-11-22 ENCOUNTER — Other Ambulatory Visit: Payer: Self-pay | Admitting: Physician Assistant

## 2017-01-27 DIAGNOSIS — H2513 Age-related nuclear cataract, bilateral: Secondary | ICD-10-CM | POA: Diagnosis not present

## 2017-02-11 NOTE — Progress Notes (Signed)
Subjective:   Jaclyn Bennett is a 66 y.o. female who presents for an Initial Medicare Annual Wellness Visit.  Substitute teacher for pre-K and Kindergarten   Review of Systems    No ROS.  Medicare Wellness Visit. Additional risk factors are reflected in the social history.  Cardiac Risk Factors include: dyslipidemia;family history of premature cardiovascular disease;advanced age (>34men, >28 women)   Sleep patterns: Difficulty falling asleep.  Home Safety/Smoke Alarms: Feels safe in home. Smoke alarms in place.  Living environment; residence and Firearm Safety: Lives alone in 2 story town home.  Seat Belt Safety/Bike Helmet: Wears seat belt.   Female:   Pap-2013, followed by GYN.       Mammo-08/17/2016, benign. GYN     Dexa scan-08/12/2016, Osteopenia. GYN       [facility]-Colonoscopy 06/14/2010, normal. Patient report no recall.      Objective:    Today's Vitals   02/15/17 1410  BP: 102/60  Pulse: 75  Resp: 14  Temp: 98 F (36.7 C)  TempSrc: Oral  SpO2: 99%  Weight: 121 lb (54.9 kg)  Height: 5\' 3"  (1.6 m)   Body mass index is 21.43 kg/m.   Current Medications (verified) Outpatient Encounter Prescriptions as of 02/15/2017  Medication Sig  . aspirin 81 MG tablet Take 81 mg by mouth daily.    Marland Kitchen atorvastatin (LIPITOR) 20 MG tablet Take 0.5 tablets (10 mg total) by mouth daily.  . Cholecalciferol (VITAMIN D) 1000 UNITS capsule Take 1,000 Units by mouth daily.    . cimetidine (TAGAMET) 200 MG tablet Take 0.5 tablets (100 mg total) by mouth daily.  . Coconut Oil 1000 MG CAPS Take 1,000 mg by mouth daily.   . Coenzyme Q10 (COQ10) 100 MG CAPS Take 1 capsule by mouth daily.   . cyclobenzaprine (FLEXERIL) 10 MG tablet Take 1 tablet (10 mg total) by mouth daily.  Marland Kitchen estradiol (ESTRACE) 0.5 MG tablet Take 0.25 mg by mouth daily.   . famciclovir (FAMVIR) 250 MG tablet Take 1 tablet (250 mg total) by mouth 2 (two) times daily.  Marland Kitchen levothyroxine (SYNTHROID, LEVOTHROID) 75 MCG tablet  TAKE ONE (1) TABLET EACH DAY  . lisinopril (PRINIVIL,ZESTRIL) 2.5 MG tablet Take 1 tablet (2.5 mg total) by mouth daily.  . metoprolol succinate (TOPROL XL) 25 MG 24 hr tablet Take 1 tablet (25 mg total) by mouth at bedtime.  . NON FORMULARY Take 2 tablets by mouth daily. Bone-Up: Calcium Supplement  . vitamin C (ASCORBIC ACID) 500 MG tablet Take 1,000 mg by mouth daily.   . vitamin E 400 UNIT capsule Take 400 Units by mouth daily.     No facility-administered encounter medications on file as of 02/15/2017.     Allergies (verified) Iodine; Valtrex [valacyclovir hcl]; Anesthetics, amide; and Sulfonamide derivatives   History: Past Medical History:  Diagnosis Date  . Acute asthmatic bronchitis   . Anxiety   . Fibromyalgia   . Herpes simplex type 2 infection 03/30/2016  . Hypercholesterolemia   . Hypothyroidism   . Interstitial cystitis    Past Surgical History:  Procedure Laterality Date  . LEFT HEART CATHETERIZATION WITH CORONARY ANGIOGRAM Bilateral 03/13/2014   Procedure: LEFT HEART CATHETERIZATION WITH CORONARY ANGIOGRAM;  Surgeon: Blane Ohara, MD;  Location: [facility] CATH LAB;  Service: Cardiovascular;  Laterality: Bilateral;  . TOTAL ABDOMINAL HYSTERECTOMY  1997   Family History  Problem Relation Age of Onset  . Other Mother        bronchiectasis  . Pneumonia Mother   .  Heart failure Mother   . Alzheimer's disease Father   . Stroke Father   . Heart failure Father   . Pancreatic cancer Brother   . Hyperlipidemia Sister   . Other Sister        bronchiectasis   Social History   Occupational History  . school teacher - substitute teaching now    Social History Main Topics  . Smoking status: Never Smoker  . Smokeless tobacco: Never Used  . Alcohol use Yes     Comment: Rare  . Drug use: No  . Sexual activity: Not on file    Tobacco Counseling Counseling given: Yes   Activities of Daily Living In your present state of health, do you have any difficulty  performing the following activities: 02/15/2017 02/15/2017  Hearing? N N  Vision? N N  Difficulty concentrating or making decisions? N N  Walking or climbing stairs? N N  Dressing or bathing? N N  Doing errands, shopping? N N  Preparing Food and eating ? N -  Using the Toilet? N -  In the past six months, have you accidently leaked urine? N -  Do you have problems with loss of bowel control? N -  Managing your Medications? N -  Managing your Finances? N -  Housekeeping or managing your Housekeeping? N -  Some recent data might be hidden    Immunizations and Health Maintenance Immunization History  Administered Date(s) Administered  . Tdap 12/03/2010   There are no preventive care reminders to display for this patient.  Patient Care Team: Delorse Limber as PCP - General (Physician Assistant) Noralee Space, MD as Consulting Physician (Pulmonary Disease) Lelon Perla, MD as Consulting Physician (Cardiology) Paula Compton, MD as Consulting Physician (Obstetrics and Gynecology) Bjorn Loser, MD as Consulting Physician (Urology)  Indicate any recent Medical Services you may have received from other than [facility] providers in the past year (date may be approximate).     Assessment:   This is a routine wellness examination for [facility] Physical assessment deferred to PCP.   Hearing/Vision screen Hearing Screening Comments: Able to hear conversational tones w/o difficulty. No issues reported.  Last exam 2017 Vision Screening Comments: Last exam 01/27/17, every 6 months. Pt reports cataracts. Dr. Isac Caddy  Dietary issues and exercise activities discussed: Current Exercise Habits: Home exercise routine, Type of exercise: walking, Time (Minutes): 20, Frequency (Times/Week): 3, Weekly Exercise (Minutes/Week): 60, Exercise limited by: None identified   Diet (meal preparation, eat out, water intake, caffeinated beverages, dairy products, fruits and vegetables): Drinks  water.   Breakfast: Cereal, fruit Lunch: yogurt, pecans and fruit; pbj sandwich  Dinner: cereal, fruit; protein and vegetables.   Discussed heart healthy diet and maintaining activity level.   Goals      Patient Stated   . patient (pt-stated)          Maintain current health by staying active, sleeping more.       Depression Screen PHQ 2/9 Scores 02/15/2017 02/15/2017 08/09/2016 08/04/2015 12/31/2014 10/25/2013 12/11/2012  PHQ - 2 Score 0 0 0 0 0 0 0  PHQ- 9 Score - 0 - - - - -    Fall Risk Fall Risk  02/15/2017 08/09/2016 08/04/2015 10/25/2013  Falls in the past year? No No No No  Risk for fall due to : - - - History of fall(s)    Cognitive Function:       Ad8 score reviewed for issues:  Issues making decisions: no  Less interest in hobbies / activities: no  Repeats questions, stories (family complaining): no  Trouble using ordinary gadgets (microwave, computer, phone): no  Forgets the month or year: no  Mismanaging finances: no  Remembering appts: no  Daily problems with thinking and/or memory: no Ad8 score is=0     Screening Tests Health Maintenance  Topic Date Due  . INFLUENZA VACCINE  01/12/2018 (Originally 01/12/2017)  . PNA vac Low Risk Adult (1 of 2 - PCV13) 03/14/2018 (Originally 07/18/2015)  . Hepatitis C Screening  02/16/2019 (Originally 06-15-1950)  . MAMMOGRAM  08/13/2018  . DEXA SCAN  08/13/2018  . COLONOSCOPY  06/14/2020  . DTaP/Tdap/Td (2 - Td) 12/02/2020  . TETANUS/TDAP  12/02/2020      Plan:    Bring a copy of your living will and/or healthcare power of attorney to your next office visit.  Continue doing brain stimulating activities (puzzles, reading, adult coloring books, staying active) to keep memory sharp.    I have personally reviewed and noted the following in the patient's chart:   . Medical and social history . Use of alcohol, tobacco or illicit drugs  . Current medications and supplements . Functional ability and  status . Nutritional status . Physical activity . Advanced directives . List of other physicians . Hospitalizations, surgeries, and ER visits in previous 12 months . Vitals . Screenings to include cognitive, depression, and falls . Referrals and appointments  In addition, I have reviewed and discussed with patient certain preventive protocols, quality metrics, and best practice recommendations. A written personalized care plan for preventive services as well as general preventive health recommendations were provided to patient.     Gerilyn Nestle, RN   02/15/2017

## 2017-02-15 ENCOUNTER — Ambulatory Visit: Payer: Medicare Other

## 2017-02-15 ENCOUNTER — Ambulatory Visit (INDEPENDENT_AMBULATORY_CARE_PROVIDER_SITE_OTHER): Payer: Medicare Other | Admitting: Physician Assistant

## 2017-02-15 ENCOUNTER — Encounter: Payer: Self-pay | Admitting: Physician Assistant

## 2017-02-15 VITALS — BP 102/60 | HR 75 | Temp 98.0°F | Resp 14 | Ht 63.0 in | Wt 121.0 lb

## 2017-02-15 DIAGNOSIS — E039 Hypothyroidism, unspecified: Secondary | ICD-10-CM | POA: Diagnosis not present

## 2017-02-15 DIAGNOSIS — B009 Herpesviral infection, unspecified: Secondary | ICD-10-CM | POA: Diagnosis not present

## 2017-02-15 DIAGNOSIS — Z0000 Encounter for general adult medical examination without abnormal findings: Secondary | ICD-10-CM | POA: Diagnosis not present

## 2017-02-15 DIAGNOSIS — M797 Fibromyalgia: Secondary | ICD-10-CM

## 2017-02-15 DIAGNOSIS — N3010 Interstitial cystitis (chronic) without hematuria: Secondary | ICD-10-CM | POA: Diagnosis not present

## 2017-02-15 DIAGNOSIS — E7800 Pure hypercholesterolemia, unspecified: Secondary | ICD-10-CM | POA: Diagnosis not present

## 2017-02-15 MED ORDER — ATORVASTATIN CALCIUM 20 MG PO TABS: 10.0000 mg | ORAL_TABLET | Freq: Every day | ORAL | 1 refills | Status: DC

## 2017-02-15 MED ORDER — CIMETIDINE 200 MG PO TABS: 100.0000 mg | ORAL_TABLET | Freq: Every day | ORAL | 1 refills | Status: DC

## 2017-02-15 MED ORDER — FAMCICLOVIR 250 MG PO TABS: 125.0000 mg | ORAL_TABLET | Freq: Every day | ORAL | 5 refills | Status: DC

## 2017-02-15 MED ORDER — LEVOTHYROXINE SODIUM 75 MCG PO TABS: ORAL_TABLET | ORAL | 1 refills | Status: DC

## 2017-02-15 MED ORDER — CYCLOBENZAPRINE HCL 10 MG PO TABS: 10.0000 mg | ORAL_TABLET | Freq: Every day | ORAL | 5 refills | Status: DC

## 2017-02-15 NOTE — Progress Notes (Signed)
Patient presents to clinic today for follow-up of chronic medical issues.   Hypothyroidism -- Patient is currently on levothyroxine 75 mcg daily. Is taking medication as directed without side effect. Endorses thyroid previously well-controlled on current dose of medication. Is due for repeat labs today.  Hyperlipidemia -- Patient currently on regimen of Atorvastatin 20 mg QD. Is taking as directed. Endorses well-balanced, low fat diet. Is staying active.   HSV II -- Patient taking Famvir 125 mg daily for suppressive therapy due to history of recurrent outbreaks. Has not had outbreak since starting suppressive therapy. Is tolerating well.   Interstitial Cystitis -- Currently on Tagamet 100 mg daily. Has done phenomenally since starting this mediation. Is avoiding dietary triggers. Denies any breakthrough symptoms.  Past Medical History:  Diagnosis Date  . Acute asthmatic bronchitis   . Anxiety   . Fibromyalgia   . Herpes simplex type 2 infection 03/30/2016  . Hypercholesterolemia   . Hypothyroidism   . Interstitial cystitis     Current Outpatient Prescriptions on File Prior to Visit  Medication Sig Dispense Refill  . aspirin 81 MG tablet Take 81 mg by mouth daily.      . Cholecalciferol (VITAMIN D) 1000 UNITS capsule Take 1,000 Units by mouth daily.      . Coconut Oil 1000 MG CAPS Take 1,000 mg by mouth daily.     . Coenzyme Q10 (COQ10) 100 MG CAPS Take 1 capsule by mouth daily.     Marland Kitchen estradiol (ESTRACE) 0.5 MG tablet Take 0.25 mg by mouth daily.     Marland Kitchen lisinopril (PRINIVIL,ZESTRIL) 2.5 MG tablet Take 1 tablet (2.5 mg total) by mouth daily. 30 tablet 12  . metoprolol succinate (TOPROL XL) 25 MG 24 hr tablet Take 1 tablet (25 mg total) by mouth at bedtime. 90 tablet 3  . NON FORMULARY Take 2 tablets by mouth daily. Bone-Up: Calcium Supplement    . vitamin C (ASCORBIC ACID) 500 MG tablet Take 1,000 mg by mouth daily.     . vitamin E 400 UNIT capsule Take 400 Units by mouth daily.         No current facility-administered medications on file prior to visit.     Allergies  Allergen Reactions  . Iodine Anaphylaxis  . Valtrex [Valacyclovir Hcl] Other (See Comments)    Severe nausea   . Anesthetics, Amide Nausea And Vomiting  . Sulfonamide Derivatives     REACTION: throat swelling and itching    Family History  Problem Relation Age of Onset  . Other Mother        bronchiectasis  . Pneumonia Mother   . Heart failure Mother   . Alzheimer's disease Father   . Stroke Father   . Heart failure Father   . Pancreatic cancer Brother   . Hyperlipidemia Sister   . Other Sister        bronchiectasis    Social History   Social History  . Marital status: Divorced    Spouse name: N/A  . Number of children: 3  . Years of education: N/A   Occupational History  . school teacher - substitute teaching now    Social History Main Topics  . Smoking status: Never Smoker  . Smokeless tobacco: Never Used  . Alcohol use Yes     Comment: Rare  . Drug use: No  . Sexual activity: Not Asked   Other Topics Concern  . None   Social History Narrative   Plays piano, active w/ her  church   Declines flu and pneumonia shot 04-30-10   Review of Systems - See HPI.  All other ROS are negative.  BP 102/60   Pulse 75   Temp 98 F (36.7 C) (Oral)   Resp 14   Ht 5\' 3"  (1.6 m)   Wt 121 lb (54.9 kg)   SpO2 99%   BMI 21.43 kg/m   Physical Exam  Constitutional: She is oriented to person, place, and time and well-developed, well-nourished, and in no distress.  HENT:  Head: Normocephalic and atraumatic.  Eyes: Conjunctivae are normal.  Neck: Neck supple.  Cardiovascular: Normal rate, regular rhythm, normal heart sounds and intact distal pulses.   Pulmonary/Chest: Effort normal and breath sounds normal. No respiratory distress. She has no wheezes. She has no rales. She exhibits no tenderness.  Neurological: She is alert and oriented to person, place, and time.  Skin: Skin is  warm and dry. No rash noted.  Psychiatric: Affect normal.  Vitals reviewed.  Assessment/Plan: Hypothyroidism Previously well-controlled with current regimen. Will repeat TSH level today. Will alter dosage accordingly.   INTERSTITIAL CYSTITIS Doing extremely well at present. Continue tagamet and dietary recommendations.   Fibromyalgia Doing very well. No changes today.  Herpes simplex type 2 infection Well controlled with Famciclovir 125 mg daily as suppressive therapy. Will continue same.    HYPERCHOLESTEROLEMIA Taking medications as directed. Body mass index is 21.43 kg/m. Repeat fasting labs today. Continue well-balanced diet and exercising. 81 mg ASA daily.    Leeanne Rio, PA-C

## 2017-02-15 NOTE — Progress Notes (Signed)
Pre visit review using our clinic review tool, if applicable. No additional management support is needed unless otherwise documented below in the visit note. 

## 2017-02-15 NOTE — Patient Instructions (Addendum)
Please continue current medication regimen.  Start the recommendations below for sleep. Start a daily Claritin for allergies. Follow-up if symptoms are not resolving.  Sleep Hygiene  Do: (1) Go to bed at the same time each day. (2) Get up from bed at the same time each day. (3) Get regular exercise each day, preferably in the morning.  There is goof evidence that regular exercise improves restful sleep.  This includes stretching and aerobic exercise. (4) Get regular exposure to outdoor or bright lights, especially in the late afternoon. (5) Keep the temperature in your bedroom comfortable. (6) Keep the bedroom quiet when sleeping. (7) Keep the bedroom dark enough to facilitate sleep. (8) Use your bed only for sleep and sex. (9) Take medications as directed.  It is helpful to take prescribed sleeping pills 1 hour before bedtime, so they are causing drowsiness when you lie down, or 10 hours before getting up, to avoid daytime drowsiness. (10) Use a relaxation exercise just before going to sleep -- imagery, massage, warm bath. (11) Keep your feet and hands warm.  Wear warm socks and/or mittens or gloves to bed.  Don't: (1) Exercise just before going to bed. (2) Engage in stimulating activity just before bed, such as playing a competitive game, watching an exciting program on television, or having an important discussion with a loved one. (3) Have caffeine in the evening (coffee, teas, chocolate, sodas, etc.) (4) Read or watch television in bed. (5) Use alcohol to help you sleep. (6) Go to bed too hungry or too full. (7) Take another person's sleeping pills. (8) Take over-the-counter sleeping pills, without your doctor's knowledge.  Tolerance can develop rapidly with these medications.  Diphenhydramine can have serious side effects for elderly patients. (9) Take daytime naps. (10) Command yourself to go to sleep.  This only makes your mind and body more alert.  If you lie awake for more  than 20-30 minutes, get up, go to a different room, participate in a quiet activity (Ex - non-excitable reading or television), and then return to bed when you feel sleepy.  Do this as many times during the night as needed.  This may cause you to have a night or two of poor sleep but it will train your brain to know when it is time for sleep.   Bring a copy of your living will and/or healthcare power of attorney to your next office visit.  Continue doing brain stimulating activities (puzzles, reading, adult coloring books, staying active) to keep memory sharp.   Health Maintenance, Female Adopting a healthy lifestyle and getting preventive care can go a long way to promote health and wellness. Talk with your health care provider about what schedule of regular examinations is right for you. This is a good chance for you to check in with your provider about disease prevention and staying healthy. In between checkups, there are plenty of things you can do on your own. Experts have done a lot of research about which lifestyle changes and preventive measures are most likely to keep you healthy. Ask your health care provider for more information. Weight and diet Eat a healthy diet  Be sure to include plenty of vegetables, fruits, low-fat dairy products, and lean protein.  Do not eat a lot of foods high in solid fats, added sugars, or salt.  Get regular exercise. This is one of the most important things you can do for your health. ? Most adults should exercise for at least 150 minutes  each week. The exercise should increase your heart rate and make you sweat (moderate-intensity exercise). ? Most adults should also do strengthening exercises at least twice a week. This is in addition to the moderate-intensity exercise.  Maintain a healthy weight  Body mass index (BMI) is a measurement that can be used to identify possible weight problems. It estimates body fat based on height and weight. Your health  care provider can help determine your BMI and help you achieve or maintain a healthy weight.  For females 24 years of age and older: ? A BMI below 18.5 is considered underweight. ? A BMI of 18.5 to 24.9 is normal. ? A BMI of 25 to 29.9 is considered overweight. ? A BMI of 30 and above is considered obese.  Watch levels of cholesterol and blood lipids  You should start having your blood tested for lipids and cholesterol at 66 years of age, then have this test every 5 years.  You may need to have your cholesterol levels checked more often if: ? Your lipid or cholesterol levels are high. ? You are older than 66 years of age. ? You are at high risk for heart disease.  Cancer screening Lung Cancer  Lung cancer screening is recommended for adults 62-12 years old who are at high risk for lung cancer because of a history of smoking.  A yearly low-dose CT scan of the lungs is recommended for people who: ? Currently smoke. ? Have quit within the past 15 years. ? Have at least a 30-pack-year history of smoking. A pack year is smoking an average of one pack of cigarettes a day for 1 year.  Yearly screening should continue until it has been 15 years since you quit.  Yearly screening should stop if you develop a health problem that would prevent you from having lung cancer treatment.  Breast Cancer  Practice breast self-awareness. This means understanding how your breasts normally appear and feel.  It also means doing regular breast self-exams. Let your health care provider know about any changes, no matter how small.  If you are in your 20s or 30s, you should have a clinical breast exam (CBE) by a health care provider every 1-3 years as part of a regular health exam.  If you are 73 or older, have a CBE every year. Also consider having a breast X-ray (mammogram) every year.  If you have a family history of breast cancer, talk to your health care provider about genetic screening.  If you  are at high risk for breast cancer, talk to your health care provider about having an MRI and a mammogram every year.  Breast cancer gene (BRCA) assessment is recommended for women who have family members with BRCA-related cancers. BRCA-related cancers include: ? Breast. ? Ovarian. ? Tubal. ? Peritoneal cancers.  Results of the assessment will determine the need for genetic counseling and BRCA1 and BRCA2 testing.  Cervical Cancer Your health care provider may recommend that you be screened regularly for cancer of the pelvic organs (ovaries, uterus, and vagina). This screening involves a pelvic examination, including checking for microscopic changes to the surface of your cervix (Pap test). You may be encouraged to have this screening done every 3 years, beginning at age 78.  For women ages 41-65, health care providers may recommend pelvic exams and Pap testing every 3 years, or they may recommend the Pap and pelvic exam, combined with testing for human papilloma virus (HPV), every 5 years. Some types of  HPV increase your risk of cervical cancer. Testing for HPV may also be done on women of any age with unclear Pap test results.  Other health care providers may not recommend any screening for nonpregnant women who are considered low risk for pelvic cancer and who do not have symptoms. Ask your health care provider if a screening pelvic exam is right for you.  If you have had past treatment for cervical cancer or a condition that could lead to cancer, you need Pap tests and screening for cancer for at least 20 years after your treatment. If Pap tests have been discontinued, your risk factors (such as having a new sexual partner) need to be reassessed to determine if screening should resume. Some women have medical problems that increase the chance of getting cervical cancer. In these cases, your health care provider may recommend more frequent screening and Pap tests.  Colorectal Cancer  This type  of cancer can be detected and often prevented.  Routine colorectal cancer screening usually begins at 66 years of age and continues through 66 years of age.  Your health care provider may recommend screening at an earlier age if you have risk factors for colon cancer.  Your health care provider may also recommend using home test kits to check for hidden blood in the stool.  A small camera at the end of a tube can be used to examine your colon directly (sigmoidoscopy or colonoscopy). This is done to check for the earliest forms of colorectal cancer.  Routine screening usually begins at age 39.  Direct examination of the colon should be repeated every 5-10 years through 66 years of age. However, you may need to be screened more often if early forms of precancerous polyps or small growths are found.  Skin Cancer  Check your skin from head to toe regularly.  Tell your health care provider about any new moles or changes in moles, especially if there is a change in a mole's shape or color.  Also tell your health care provider if you have a mole that is larger than the size of a pencil eraser.  Always use sunscreen. Apply sunscreen liberally and repeatedly throughout the day.  Protect yourself by wearing long sleeves, pants, a wide-brimmed hat, and sunglasses whenever you are outside.  Heart disease, diabetes, and high blood pressure  High blood pressure causes heart disease and increases the risk of stroke. High blood pressure is more likely to develop in: ? People who have blood pressure in the high end of the normal range (130-139/85-89 mm Hg). ? People who are overweight or obese. ? People who are African American.  If you are 108-9 years of age, have your blood pressure checked every 3-5 years. If you are 25 years of age or older, have your blood pressure checked every year. You should have your blood pressure measured twice-once when you are at a hospital or clinic, and once when you  are not at a hospital or clinic. Record the average of the two measurements. To check your blood pressure when you are not at a hospital or clinic, you can use: ? An automated blood pressure machine at a pharmacy. ? A home blood pressure monitor.  If you are between 26 years and 58 years old, ask your health care provider if you should take aspirin to prevent strokes.  Have regular diabetes screenings. This involves taking a blood sample to check your fasting blood sugar level. ? If you are at  a normal weight and have a low risk for diabetes, have this test once every three years after 66 years of age. ? If you are overweight and have a high risk for diabetes, consider being tested at a younger age or more often. Preventing infection Hepatitis B  If you have a higher risk for hepatitis B, you should be screened for this virus. You are considered at high risk for hepatitis B if: ? You were born in a country where hepatitis B is common. Ask your health care provider which countries are considered high risk. ? Your parents were born in a high-risk country, and you have not been immunized against hepatitis B (hepatitis B vaccine). ? You have HIV or AIDS. ? You use needles to inject street drugs. ? You live with someone who has hepatitis B. ? You have had sex with someone who has hepatitis B. ? You get hemodialysis treatment. ? You take certain medicines for conditions, including cancer, organ transplantation, and autoimmune conditions.  Hepatitis C  Blood testing is recommended for: ? Everyone born from [birth year] through 1965. ? Anyone with known risk factors for hepatitis C.  Sexually transmitted infections (STIs)  You should be screened for sexually transmitted infections (STIs) including gonorrhea and chlamydia if: ? You are sexually active and are younger than 66 years of age. ? You are older than 66 years of age and your health care provider tells you that you are at risk for this type of  infection. ? Your sexual activity has changed since you were last screened and you are at an increased risk for chlamydia or gonorrhea. Ask your health care provider if you are at risk.  If you do not have HIV, but are at risk, it may be recommended that you take a prescription medicine daily to prevent HIV infection. This is called pre-exposure prophylaxis (PrEP). You are considered at risk if: ? You are sexually active and do not regularly use condoms or know the HIV status of your partner(s). ? You take drugs by injection. ? You are sexually active with a partner who has HIV.  Talk with your health care provider about whether you are at high risk of being infected with HIV. If you choose to begin PrEP, you should first be tested for HIV. You should then be tested every 3 months for as long as you are taking PrEP. Pregnancy  If you are premenopausal and you may become pregnant, ask your health care provider about preconception counseling.  If you may become pregnant, take 400 to 800 micrograms (mcg) of folic acid every day.  If you want to prevent pregnancy, talk to your health care provider about birth control (contraception). Osteoporosis and menopause  Osteoporosis is a disease in which the bones lose minerals and strength with aging. This can result in serious bone fractures. Your risk for osteoporosis can be identified using a bone density scan.  If you are 9 years of age or older, or if you are at risk for osteoporosis and fractures, ask your health care provider if you should be screened.  Ask your health care provider whether you should take a calcium or vitamin D supplement to lower your risk for osteoporosis.  Menopause may have certain physical symptoms and risks.  Hormone replacement therapy may reduce some of these symptoms and risks. Talk to your health care provider about whether hormone replacement therapy is right for you. Follow these instructions at home:  Schedule  regular health, dental,  and eye exams.  Stay current with your immunizations.  Do not use any tobacco products including cigarettes, chewing tobacco, or electronic cigarettes.  If you are pregnant, do not drink alcohol.  If you are breastfeeding, limit how much and how often you drink alcohol.  Limit alcohol intake to no more than 1 drink per day for nonpregnant women. One drink equals 12 ounces of beer, 5 ounces of wine, or 1 ounces of hard liquor.  Do not use street drugs.  Do not share needles.  Ask your health care provider for help if you need support or information about quitting drugs.  Tell your health care provider if you often feel depressed.  Tell your health care provider if you have ever been abused or do not feel safe at home. This information is not intended to replace advice given to you by your health care provider. Make sure you discuss any questions you have with your health care provider. Document Released: 12/14/2010 Document Revised: 11/06/2015 Document Reviewed: 03/04/2015 Elsevier Interactive Patient Education  [employer].

## 2017-02-16 LAB — HEPATIC FUNCTION PANEL
ALBUMIN: 4.2 g/dL (ref 3.6–5.1)
ALT: 23 U/L (ref 6–29)
AST: 21 U/L (ref 10–35)
Alkaline Phosphatase: 95 U/L (ref 33–130)
Bilirubin, Direct: 0 mg/dL (ref <=0.2)
Indirect Bilirubin: 0.3 mg/dL (ref 0.2–1.2)
Total Bilirubin: 0.3 mg/dL (ref 0.2–1.2)
Total Protein: 6.6 g/dL (ref 6.1–8.1)

## 2017-02-16 LAB — TSH: TSH: 2.38 m[IU]/L

## 2017-02-19 NOTE — Assessment & Plan Note (Signed)
Previously well-controlled with current regimen. Will repeat TSH level today. Will alter dosage accordingly.

## 2017-02-19 NOTE — Assessment & Plan Note (Signed)
Doing extremely well at present. Continue tagamet and dietary recommendations.

## 2017-02-19 NOTE — Assessment & Plan Note (Signed)
Doing very well. No changes today.

## 2017-02-19 NOTE — Progress Notes (Signed)
RN MWV note reviewed.  Cassidi Modesitt Cody, PA-C  

## 2017-02-19 NOTE — Assessment & Plan Note (Signed)
Taking medications as directed. Body mass index is 21.43 kg/m. Repeat fasting labs today. Continue well-balanced diet and exercising. 81 mg ASA daily.

## 2017-02-19 NOTE — Assessment & Plan Note (Signed)
Well controlled with Famciclovir 125 mg daily as suppressive therapy. Will continue same.

## 2017-03-02 ENCOUNTER — Ambulatory Visit: Payer: Medicare Other

## 2017-03-07 NOTE — Progress Notes (Signed)
HPI: FU cardiomyopathy; previously seen for atypical CP; stress echo 9/15 showed baseline EF 45; inferior, septal and apical HK both at rest and with stress. Cardiac catheterization September 2015 showed normal coronary arteries. Laboratories January 2018 showed BNP 13.6, normal TSH and normal hemoglobin. Echocardiogram repeated 2/18 and showed ejection fraction 40-10%, grade 1 diastolic dysfunction. Since last seen, patient denies increased dyspnea, palpitations or syncope. No exertional chest pain.  Current Outpatient Prescriptions  Medication Sig Dispense Refill  . aspirin 81 MG tablet Take 81 mg by mouth daily.      Marland Kitchen atorvastatin (LIPITOR) 20 MG tablet Take 0.5 tablets (10 mg total) by mouth daily. 45 tablet 1  . Cholecalciferol (VITAMIN D) 1000 UNITS capsule Take 1,000 Units by mouth daily.      . cimetidine (TAGAMET) 200 MG tablet Take 0.5 tablets (100 mg total) by mouth daily. 45 tablet 1  . Coconut Oil 1000 MG CAPS Take 1,000 mg by mouth daily.     . Coenzyme Q10 (COQ10) 100 MG CAPS Take 1 capsule by mouth daily.     . cyclobenzaprine (FLEXERIL) 10 MG tablet Take 1 tablet (10 mg total) by mouth daily. 30 tablet 5  . estradiol (ESTRACE) 0.5 MG tablet Take 0.25 mg by mouth daily.     . famciclovir (FAMVIR) 250 MG tablet Take 0.5 tablets (125 mg total) by mouth daily. 30 tablet 5  . levothyroxine (SYNTHROID, LEVOTHROID) 75 MCG tablet TAKE ONE (1) TABLET EACH DAY 90 tablet 1  . lisinopril (PRINIVIL,ZESTRIL) 2.5 MG tablet Take 1 tablet (2.5 mg total) by mouth daily. 30 tablet 12  . metoprolol succinate (TOPROL XL) 25 MG 24 hr tablet Take 1 tablet (25 mg total) by mouth at bedtime. 90 tablet 3  . NON FORMULARY Take 2 tablets by mouth daily. Bone-Up: Calcium Supplement    . vitamin C (ASCORBIC ACID) 500 MG tablet Take 1,000 mg by mouth daily.     . vitamin E 400 UNIT capsule Take 400 Units by mouth daily.       No current facility-administered medications for this visit.      Past  Medical History:  Diagnosis Date  . Acute asthmatic bronchitis   . Anxiety   . Fibromyalgia   . Herpes simplex type 2 infection 03/30/2016  . Hypercholesterolemia   . Hypothyroidism   . Interstitial cystitis     Past Surgical History:  Procedure Laterality Date  . LEFT HEART CATHETERIZATION WITH CORONARY ANGIOGRAM Bilateral 03/13/2014   Procedure: LEFT HEART CATHETERIZATION WITH CORONARY ANGIOGRAM;  Surgeon: Blane Ohara, MD;  Location: [facility] CATH LAB;  Service: Cardiovascular;  Laterality: Bilateral;  . TOTAL ABDOMINAL HYSTERECTOMY  1997    Social History   Social History  . Marital status: Divorced    Spouse name: N/A  . Number of children: 3  . Years of education: N/A   Occupational History  . school teacher - substitute teaching now    Social History Main Topics  . Smoking status: Never Smoker  . Smokeless tobacco: Never Used  . Alcohol use Yes     Comment: Rare  . Drug use: No  . Sexual activity: Not on file   Other Topics Concern  . Not on file   Social History Narrative   Plays piano, 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   Declines flu and pneumonia shot 04-30-10    Family History  Problem Relation Age of Onset  . Other Mother  bronchiectasis  . Pneumonia Mother   . Heart failure Mother   . Alzheimer's disease Father   . Stroke Father   . Heart failure Father   . Pancreatic cancer Brother   . Hyperlipidemia Sister   . Other Sister        bronchiectasis    ROS: no fevers or chills, productive cough, hemoptysis, dysphasia, odynophagia, melena, hematochezia, dysuria, hematuria, rash, seizure activity, orthopnea, PND, pedal edema, claudication. Remaining systems are negative.  Physical Exam: Well-developed well-nourished in no acute distress.  Skin is warm and dry.  HEENT is normal.  Neck is supple.  Chest is clear to auscultation with normal expansion.  Cardiovascular exam is regular rate and rhythm.  Abdominal exam nontender or distended. No masses  palpated. Extremities show no edema. neuro grossly intact   A/P  1 Nonischemic cardiomyopathy-LV function is mildly reduced on most recent echocardiogram. Continue ACE inhibitor and beta blocker. Patient is not volume overloaded on examination. She is not particular symptomatic.  2 hyperlipidemia-continue statin.  Kirk Ruths, MD

## 2017-03-16 ENCOUNTER — Ambulatory Visit (INDEPENDENT_AMBULATORY_CARE_PROVIDER_SITE_OTHER): Payer: Medicare Other | Admitting: Cardiology

## 2017-03-16 ENCOUNTER — Encounter: Payer: Self-pay | Admitting: Cardiology

## 2017-03-16 VITALS — BP 109/66 | HR 73 | Ht 63.0 in | Wt 119.8 lb

## 2017-03-16 DIAGNOSIS — I420 Dilated cardiomyopathy: Secondary | ICD-10-CM

## 2017-03-16 DIAGNOSIS — E7800 Pure hypercholesterolemia, unspecified: Secondary | ICD-10-CM

## 2017-03-16 NOTE — Patient Instructions (Signed)
Your physician wants you to follow-up in: ONE YEAR WITH DR CRENSHAW You will receive a reminder letter in the mail two months in advance. If you don't receive a letter, please call our office to schedule the follow-up appointment.   If you need a refill on your cardiac medications before your next appointment, please call your pharmacy.  

## 2017-03-28 ENCOUNTER — Encounter: Payer: Self-pay | Admitting: Physician Assistant

## 2017-03-28 ENCOUNTER — Ambulatory Visit (INDEPENDENT_AMBULATORY_CARE_PROVIDER_SITE_OTHER): Payer: Medicare Other | Admitting: Physician Assistant

## 2017-03-28 VITALS — BP 100/62 | HR 76 | Temp 97.9°F | Resp 14 | Ht 63.0 in | Wt 120.0 lb

## 2017-03-28 DIAGNOSIS — J069 Acute upper respiratory infection, unspecified: Secondary | ICD-10-CM

## 2017-03-28 MED ORDER — FLUTICASONE PROPIONATE 50 MCG/ACT NA SUSP: 2.0000 | Freq: Every day | NASAL | 0 refills | Status: DC

## 2017-03-28 NOTE — Progress Notes (Signed)
Pre visit review using our clinic review tool, if applicable. No additional management support is needed unless otherwise documented below in the visit note. 

## 2017-03-28 NOTE — Progress Notes (Signed)
Patient presents to clinic today c/o 5 days of sore throat and pain in ears bilaterally. Noted chills initially that have resolved. Denies cough but has a mild rhinorrhea and nasal congestion. Has taken Tylenol for pain with some relief. Has not taken anything else for symptoms.   Past Medical History:  Diagnosis Date  . Acute asthmatic bronchitis   . Anxiety   . Fibromyalgia   . Herpes simplex type 2 infection 03/30/2016  . Hypercholesterolemia   . Hypothyroidism   . Interstitial cystitis     Current Outpatient Prescriptions on File Prior to Visit  Medication Sig Dispense Refill  . aspirin 81 MG tablet Take 81 mg by mouth daily.      Marland Kitchen atorvastatin (LIPITOR) 20 MG tablet Take 0.5 tablets (10 mg total) by mouth daily. 45 tablet 1  . Cholecalciferol (VITAMIN D) 1000 UNITS capsule Take 1,000 Units by mouth daily.      . cimetidine (TAGAMET) 200 MG tablet Take 0.5 tablets (100 mg total) by mouth daily. 45 tablet 1  . Coconut Oil 1000 MG CAPS Take 1,000 mg by mouth daily.     . Coenzyme Q10 (COQ10) 100 MG CAPS Take 1 capsule by mouth daily.     . cyclobenzaprine (FLEXERIL) 10 MG tablet Take 1 tablet (10 mg total) by mouth daily. 30 tablet 5  . estradiol (ESTRACE) 0.5 MG tablet Take 0.25 mg by mouth daily.     . famciclovir (FAMVIR) 250 MG tablet Take 0.5 tablets (125 mg total) by mouth daily. 30 tablet 5  . levothyroxine (SYNTHROID, LEVOTHROID) 75 MCG tablet TAKE ONE (1) TABLET EACH DAY 90 tablet 1  . lisinopril (PRINIVIL,ZESTRIL) 2.5 MG tablet Take 1 tablet (2.5 mg total) by mouth daily. 30 tablet 12  . metoprolol succinate (TOPROL XL) 25 MG 24 hr tablet Take 1 tablet (25 mg total) by mouth at bedtime. 90 tablet 3  . NON FORMULARY Take 2 tablets by mouth daily. Bone-Up: Calcium Supplement    . vitamin C (ASCORBIC ACID) 500 MG tablet Take 1,000 mg by mouth daily.     . vitamin E 400 UNIT capsule Take 400 Units by mouth daily.       No current facility-administered medications on file  prior to visit.     Allergies  Allergen Reactions  . Iodine Anaphylaxis  . Valtrex [Valacyclovir Hcl] Other (See Comments)    Severe nausea   . Anesthetics, Amide Nausea And Vomiting  . Sulfonamide Derivatives     REACTION: throat swelling and itching    Family History  Problem Relation Age of Onset  . Other Mother        bronchiectasis  . Pneumonia Mother   . Heart failure Mother   . Alzheimer's disease Father   . Stroke Father   . Heart failure Father   . Pancreatic cancer Brother   . Hyperlipidemia Sister   . Other Sister        bronchiectasis    Social History   Social History  . Marital status: Divorced    Spouse name: N/A  . Number of children: 3  . Years of education: N/A   Occupational History  . school teacher - substitute teaching now    Social History Main Topics  . Smoking status: Never Smoker  . Smokeless tobacco: Never Used  . Alcohol use Yes     Comment: Rare  . Drug use: No  . Sexual activity: Not Asked   Other Topics Concern  .  None   Social History Narrative   Plays piano, 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   Declines flu and pneumonia shot 04-30-10   Review of Systems - See HPI.  All other ROS are negative.  BP 100/62   Pulse 76   Temp 97.9 F (36.6 C) (Oral)   Resp 14   Ht 5\' 3"  (1.6 m)   Wt 120 lb (54.4 kg)   SpO2 98%   BMI 21.26 kg/m   Physical Exam  Constitutional: She is oriented to person, place, and time and well-developed, well-nourished, and in no distress.  HENT:  Head: Normocephalic and atraumatic.  Right Ear: External ear normal.  Left Ear: External ear normal.  Nose: Nose normal.  Mouth/Throat: Oropharynx is clear and moist.  Scant amount of serous fluid behind R TM.  Eyes: Conjunctivae are normal.  Neck: Neck supple.  Cardiovascular: Normal rate, regular rhythm, normal heart sounds and intact distal pulses.   Pulmonary/Chest: Effort normal and breath sounds normal. No respiratory distress. She has no wheezes. She has no  rales. She exhibits no tenderness.  Neurological: She is alert and oriented to person, place, and time.  Skin: Skin is warm and dry. No rash noted.  Psychiatric: Affect normal.  Vitals reviewed.   Recent Results (from the past 2160 hour(s))  TSH     Status: None   Collection Time: 02/15/17  3:24 PM  Result Value Ref Range   TSH 2.38 mIU/L    Comment:   Reference Range   > or = 20 Years  0.40-4.50   Pregnancy Range First trimester  0.26-2.66 Second trimester 0.55-2.73 Third trimester  0.43-2.91     Hepatic function panel     Status: None   Collection Time: 02/15/17  3:24 PM  Result Value Ref Range   Total Bilirubin 0.3 0.2 - 1.2 mg/dL   Bilirubin, Direct 0.0 <=0.2 mg/dL   Indirect Bilirubin 0.3 0.2 - 1.2 mg/dL   Alkaline Phosphatase 95 33 - 130 U/L   AST 21 10 - 35 U/L   ALT 23 6 - 29 U/L   Total Protein 6.6 6.1 - 8.1 g/dL   Albumin 4.2 3.6 - 5.1 g/dL   Assessment/Plan: 1. Viral URI Exam overall unremarkable except for scant serous fluid behind R TM. Start supportive measures and OTC medications. Recommended echinacea supplement and Mucinex. Start Flonase. Follow-up if not resolving.   Leeanne Rio, PA-C

## 2017-03-28 NOTE — Patient Instructions (Signed)
Please stay hydrated and get plenty of rest. Start over the counter plain Mucinex twice daily.  Start the Flonase nasal spray I have given you.  Start a zinc or echinacea supplement to help with cold.  Please put a vaporizer in the bedroom.   Viral Respiratory Infection A viral respiratory infection is an illness that affects parts of the body used for breathing, like the lungs, nose, and throat. It is caused by a germ called a virus. Some examples of this kind of infection are:  A cold.  The flu (influenza).  A respiratory syncytial virus (RSV) infection.  How do I know if I have this infection? Most of the time this infection causes:  A stuffy or runny nose.  Yellow or green fluid in the nose.  A cough.  Sneezing.  Tiredness (fatigue).  Achy muscles.  A sore throat.  Sweating or chills.  A fever.  A headache.  How is this infection treated? If the flu is diagnosed early, it may be treated with an antiviral medicine. This medicine shortens the length of time a person has symptoms. Symptoms may be treated with over-the-counter and prescription medicines, such as:  Expectorants. These make it easier to cough up mucus.  Decongestant nasal sprays.  Doctors do not prescribe antibiotic medicines for viral infections. They do not work with this kind of infection. How do I know if I should stay home? To keep others from getting sick, stay home if you have:  A fever.  A lasting cough.  A sore throat.  A runny nose.  Sneezing.  Muscles aches.  Headaches.  Tiredness.  Weakness.  Chills.  Sweating.  An upset stomach (nausea).  Follow these instructions at home:  Rest as much as possible.  Take over-the-counter and prescription medicines only as told by your doctor.  Drink enough fluid to keep your pee (urine) clear or pale yellow.  Gargle with salt water. Do this 3-4 times per day or as needed. To make a salt-water mixture, dissolve -1 tsp of  salt in 1 cup of warm water. Make sure the salt dissolves all the way.  Use nose drops made from salt water. This helps with stuffiness (congestion). It also helps soften the skin around your nose.  Do not drink alcohol.  Do not use tobacco products, including cigarettes, chewing tobacco, and e-cigarettes. If you need help quitting, ask your doctor. Get help if:  Your symptoms last for 10 days or longer.  Your symptoms get worse over time.  You have a fever.  You have very bad pain in your face or forehead.  Parts of your jaw or neck become very swollen. Get help right away if:  You feel pain or pressure in your chest.  You have shortness of breath.  You faint or feel like you will faint.  You keep throwing up (vomiting).  You feel confused. This information is not intended to replace advice given to you by your health care provider. Make sure you discuss any questions you have with your health care provider. Document Released: 05/13/2008 Document Revised: 11/06/2015 Document Reviewed: 11/06/2014 Elsevier Interactive Patient Education  2018 [employer].

## 2017-04-18 ENCOUNTER — Encounter: Payer: Self-pay | Admitting: Physician Assistant

## 2017-05-17 ENCOUNTER — Other Ambulatory Visit: Payer: Self-pay | Admitting: Physician Assistant

## 2017-05-24 ENCOUNTER — Encounter: Payer: Self-pay | Admitting: Physician Assistant

## 2017-05-25 ENCOUNTER — Ambulatory Visit (INDEPENDENT_AMBULATORY_CARE_PROVIDER_SITE_OTHER): Payer: Medicare Other | Admitting: Family

## 2017-05-25 ENCOUNTER — Encounter: Payer: Self-pay | Admitting: Family

## 2017-05-25 VITALS — BP 95/52 | HR 67 | Temp 98.0°F | Resp 16 | Ht 63.0 in | Wt 120.0 lb

## 2017-05-25 DIAGNOSIS — R300 Dysuria: Secondary | ICD-10-CM

## 2017-05-25 DIAGNOSIS — N3000 Acute cystitis without hematuria: Secondary | ICD-10-CM

## 2017-05-25 LAB — POC URINALSYSI DIPSTICK (AUTOMATED)
BILIRUBIN UA: NEGATIVE
GLUCOSE UA: NEGATIVE
Ketones, UA: NEGATIVE
Leukocytes, UA: NEGATIVE
Nitrite, UA: NEGATIVE
Protein, UA: NEGATIVE
RBC UA: NEGATIVE
SPEC GRAV UA: 1.015 (ref 1.010–1.025)
UROBILINOGEN UA: NEGATIVE U/dL — AB
pH, UA: 6 (ref 5.0–8.0)

## 2017-05-25 MED ORDER — CIPROFLOXACIN HCL 500 MG PO TABS
500.0000 mg | ORAL_TABLET | Freq: Two times a day (BID) | ORAL | 0 refills | Status: DC
Start: 2017-05-25 — End: 2017-08-08

## 2017-05-25 NOTE — Patient Instructions (Signed)
Please begin cipro for possible urinary tract infection. Call if new/worsening symptoms or if not improved in 3 days.

## 2017-05-25 NOTE — Progress Notes (Signed)
Subjective:    Patient ID: Jaclyn Bennett, female    DOB: 02/11/1951, 66 y.o.   MRN: [id number]  HPI  Jaclyn Bennett is a 66 yr old female who presents today with two concerns:  1) dysuria/low back pain- reports that dysuria began about 3 weeks ago.  She also notes low back pain for about the same amount of time. She has been using otc urinary pain relief tabs with brief improvement.  Also started monistat because she was afraid that she might have a yeast infection.  She has a bladder prolapse.  Reports bilateral low back pain.  Denies gross hematuria.      Review of Systems    see HPI  Past Medical History:  Diagnosis Date  . Acute asthmatic bronchitis   . Anxiety   . Fibromyalgia   . Herpes simplex type 2 infection 03/30/2016  . Hypercholesterolemia   . Hypothyroidism   . Interstitial cystitis      Social History   Socioeconomic History  . Marital status: Divorced    Spouse name: Not on file  . Number of children: 3  . Years of education: Not on file  . Highest education level: Not on file  Social Needs  . Financial resource strain: Not on file  . Food insecurity - worry: Not on file  . Food insecurity - inability: Not on file  . Transportation needs - medical: Not on file  . Transportation needs - non-medical: Not on file  Occupational History  . Occupation: Education officer, museum - substitute teaching now  Tobacco Use  . Smoking status: Never Smoker  . Smokeless tobacco: Never Used  Substance and Sexual Activity  . Alcohol use: Yes    Comment: Rare  . Drug use: No  . Sexual activity: Not on file  Other Topics Concern  . Not on file  Social History Narrative   Plays piano, 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   Declines flu and pneumonia shot 04-30-10    Past Surgical History:  Procedure Laterality Date  . LEFT HEART CATHETERIZATION WITH CORONARY ANGIOGRAM Bilateral 03/13/2014   Procedure: LEFT HEART CATHETERIZATION WITH CORONARY ANGIOGRAM;  Surgeon: Blane Ohara, MD;   Location: [facility] CATH LAB;  Service: Cardiovascular;  Laterality: Bilateral;  . TOTAL ABDOMINAL HYSTERECTOMY  1997    Family History  Problem Relation Age of Onset  . Other Mother        bronchiectasis  . Pneumonia Mother   . Heart failure Mother   . Alzheimer's disease Father   . Stroke Father   . Heart failure Father   . Pancreatic cancer Brother   . Hyperlipidemia Sister   . Other Sister        bronchiectasis    Allergies  Allergen Reactions  . Iodine Anaphylaxis  . Valtrex [Valacyclovir Hcl] Other (See Comments)    Severe nausea   . Anesthetics, Amide Nausea And Vomiting  . Sulfonamide Derivatives     REACTION: throat swelling and itching    Current Outpatient Medications on File Prior to Visit  Medication Sig Dispense Refill  . aspirin 81 MG tablet Take 81 mg by mouth daily.      Marland Kitchen atorvastatin (LIPITOR) 20 MG tablet Take 0.5 tablets (10 mg total) by mouth daily. 45 tablet 1  . Cholecalciferol (VITAMIN D) 1000 UNITS capsule Take 1,000 Units by mouth daily.      . cimetidine (TAGAMET) 200 MG tablet Take 0.5 tablets (100 mg total) by mouth daily. 45 tablet 1  .  Coconut Oil 1000 MG CAPS Take 1,000 mg by mouth daily.     . Coenzyme Q10 (COQ10) 100 MG CAPS Take 1 capsule by mouth daily.     . cyclobenzaprine (FLEXERIL) 10 MG tablet Take 1 tablet (10 mg total) by mouth daily. 30 tablet 5  . estradiol (ESTRACE) 0.5 MG tablet Take 0.25 mg by mouth daily.     Marland Kitchen levothyroxine (SYNTHROID, LEVOTHROID) 75 MCG tablet TAKE ONE (1) TABLET BY MOUTH EACH DAY 90 tablet 1  . lisinopril (PRINIVIL,ZESTRIL) 2.5 MG tablet Take 1 tablet (2.5 mg total) by mouth daily. 30 tablet 12  . metoprolol succinate (TOPROL XL) 25 MG 24 hr tablet Take 1 tablet (25 mg total) by mouth at bedtime. 90 tablet 3  . NON FORMULARY Take 2 tablets by mouth daily. Bone-Up: Calcium Supplement    . vitamin C (ASCORBIC ACID) 500 MG tablet Take 1,000 mg by mouth daily.     . vitamin E 400 UNIT capsule Take 400 Units by  mouth daily.      . famciclovir (FAMVIR) 250 MG tablet Take 0.5 tablets (125 mg total) by mouth daily. (Patient not taking: Reported on 05/25/2017) 30 tablet 5   No current facility-administered medications on file prior to visit.     BP (!) 95/52 (BP Location: Left Arm, Patient Position: Sitting, Cuff Size: Small)   Pulse 67   Temp 98 F (36.7 C) (Oral)   Resp 16   Ht 5\' 3"  (1.6 m)   Wt 120 lb (54.4 kg)   SpO2 100%   BMI 21.26 kg/m    Objective:   Physical Exam  Constitutional: She is oriented to person, place, and time. She appears well-developed and well-nourished.  HENT:  Head: Normocephalic and atraumatic.  Cardiovascular: Normal rate, regular rhythm and normal heart sounds.  No murmur heard. Pulmonary/Chest: Effort normal and breath sounds normal. No respiratory distress. She has no wheezes.  Abdominal: There is no tenderness. There is no CVA tenderness.  Musculoskeletal: She exhibits no edema.  Neurological: She is alert and oriented to person, place, and time.  Skin: Skin is warm.  Psychiatric: She has a normal mood and affect. Her behavior is normal. Judgment and thought content normal.          Assessment & Plan:  UTI- symptoms most consistent with UTI.  UA is clear but will send for culture and begin empiric cipro. If culture negative then she will need to get back in with her urologist for further evaluation of her interstitial cystitis.  Note that her BP is low but this is her baseline.

## 2017-05-25 NOTE — Addendum Note (Signed)
Addended by: Caffie Pinto on: 05/25/2017 04:25 PM   Modules accepted: Orders

## 2017-05-26 LAB — URINE CULTURE
MICRO NUMBER:: 81396845
RESULT: NO GROWTH
SPECIMEN QUALITY:: ADEQUATE

## 2017-05-27 ENCOUNTER — Telehealth: Payer: Self-pay | Admitting: Family

## 2017-05-27 NOTE — Telephone Encounter (Signed)
Left detailed message on home# and to call if any questions. 

## 2017-05-27 NOTE — Telephone Encounter (Signed)
Urine culture is negative.  Please d/c cipro. I suspect her symptoms are related to her interstitial cystitis.  I would recommend that she arrange follow up with her urologist please.

## 2017-07-06 ENCOUNTER — Other Ambulatory Visit: Payer: Self-pay | Admitting: Obstetrics and Gynecology

## 2017-07-06 DIAGNOSIS — Z1231 Encounter for screening mammogram for malignant neoplasm of breast: Secondary | ICD-10-CM

## 2017-07-07 ENCOUNTER — Other Ambulatory Visit: Payer: Self-pay

## 2017-07-07 MED ORDER — METOPROLOL SUCCINATE ER 25 MG PO TB24: 25.0000 mg | ORAL_TABLET | Freq: Every day | ORAL | 3 refills | Status: DC

## 2017-08-08 ENCOUNTER — Telehealth: Payer: Self-pay | Admitting: Family

## 2017-08-08 ENCOUNTER — Ambulatory Visit: Payer: Medicare Other | Admitting: Family

## 2017-08-08 ENCOUNTER — Encounter: Payer: Self-pay | Admitting: Family

## 2017-08-08 ENCOUNTER — Ambulatory Visit (HOSPITAL_BASED_OUTPATIENT_CLINIC_OR_DEPARTMENT_OTHER)
Admission: RE | Admit: 2017-08-08 | Discharge: 2017-08-08 | Disposition: A | Discharge status: Home / Self Care | Payer: Medicare Other | Source: Ambulatory Visit | Attending: Family | Admitting: Family

## 2017-08-08 VITALS — BP 102/55 | HR 72 | Temp 98.1°F | Resp 16 | Ht 63.0 in | Wt 120.0 lb

## 2017-08-08 DIAGNOSIS — R0789 Other chest pain: Secondary | ICD-10-CM | POA: Insufficient documentation

## 2017-08-08 DIAGNOSIS — R079 Chest pain, unspecified: Secondary | ICD-10-CM

## 2017-08-08 DIAGNOSIS — H6980 Other specified disorders of Eustachian tube, unspecified ear: Secondary | ICD-10-CM

## 2017-08-08 LAB — D-DIMER, QUANTITATIVE: D-Dimer, Quant: 0.51 mcg/mL FEU — ABNORMAL HIGH (ref <0.50)

## 2017-08-08 LAB — BASIC METABOLIC PANEL
BUN: 21 mg/dL (ref 6–23)
CALCIUM: 9.4 mg/dL (ref 8.4–10.5)
CO2: 29 meq/L (ref 19–32)
CREATININE: 0.86 mg/dL (ref 0.40–1.20)
Chloride: 103 mEq/L (ref 96–112)
GFR: 69.94 mL/min (ref >60.00)
GLUCOSE: 131 mg/dL — AB (ref 70–99)
Potassium: 3.5 mEq/L (ref 3.5–5.1)
Sodium: 139 mEq/L (ref 135–145)

## 2017-08-08 LAB — TROPONIN I: TNIDX: 0 ug/l (ref 0.00–0.06)

## 2017-08-08 MED ORDER — ALBUTEROL SULFATE (2.5 MG/3ML) 0.083% IN NEBU
2.5000 mg | INHALATION_SOLUTION | Freq: Once | RESPIRATORY_TRACT | Status: AC
  Administered 2017-08-08: 2.5 mg via RESPIRATORY_TRACT

## 2017-08-08 NOTE — Progress Notes (Signed)
Subjective:    Patient ID: Jaclyn Bennett, female    DOB: 01-10-1951, 67 y.o.   MRN: [id number]  HPI    Patient is a 67 year old female with past medical history significant for hypothyroid, hyperlipidemia, fibromyalgia, anxiety, and interstitial cystitis, who presents today with several concerns.  Otalgia-reports bilateral ear pain which has been off and on for the last 2 weeks. Mild nasal drainage today.  She also reports chest tightness which has been present for 3 days.  Feels that she also has some pain in her mid back.  Reports that she has been taking zinc, cough drops, vit C.  She denies cough.  Reports laryngitis since Thursday. She reports energy is poor.  She has taken tylenol which helped "a little bit."  Denies sob.  Chest tightness is not worsened by exertion.  She reports that she had some wheezing last week.      Review of Systems See HPI  Past Medical History:  Diagnosis Date  . Acute asthmatic bronchitis   . Anxiety   . Fibromyalgia   . Herpes simplex type 2 infection 03/30/2016  . Hypercholesterolemia   . Hypothyroidism   . Interstitial cystitis      Social History   Socioeconomic History  . Marital status: Divorced    Spouse name: Not on file  . Number of children: 3  . Years of education: Not on file  . Highest education level: Not on file  Social Needs  . Financial resource strain: Not on file  . Food insecurity - worry: Not on file  . Food insecurity - inability: Not on file  . Transportation needs - medical: Not on file  . Transportation needs - non-medical: Not on file  Occupational History  . Occupation: Education officer, museum - substitute teaching now  Tobacco Use  . Smoking status: Never Smoker  . Smokeless tobacco: Never Used  Substance and Sexual Activity  . Alcohol use: Yes    Comment: Rare  . Drug use: No  . Sexual activity: Not on file  Other Topics Concern  . Not on file  Social History Narrative   Plays piano, 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     Declines flu and pneumonia shot 04-30-10    Past Surgical History:  Procedure Laterality Date  . LEFT HEART CATHETERIZATION WITH CORONARY ANGIOGRAM Bilateral 03/13/2014   Procedure: LEFT HEART CATHETERIZATION WITH CORONARY ANGIOGRAM;  Surgeon: Blane Ohara, MD;  Location: [facility] CATH LAB;  Service: Cardiovascular;  Laterality: Bilateral;  . TOTAL ABDOMINAL HYSTERECTOMY  1997    Family History  Problem Relation Age of Onset  . Other Mother        bronchiectasis  . Pneumonia Mother   . Heart failure Mother   . Alzheimer's disease Father   . Stroke Father   . Heart failure Father   . Pancreatic cancer Brother   . Hyperlipidemia Sister   . Other Sister        bronchiectasis    Allergies  Allergen Reactions  . Iodine Anaphylaxis  . Valtrex [Valacyclovir Hcl] Other (See Comments)    Severe nausea   . Anesthetics, Amide Nausea And Vomiting  . Sulfonamide Derivatives     REACTION: throat swelling and itching    Current Outpatient Medications on File Prior to Visit  Medication Sig Dispense Refill  . aspirin 81 MG tablet Take 81 mg by mouth daily.      Marland Kitchen atorvastatin (LIPITOR) 20 MG tablet Take 0.5 tablets (10 mg total) by  mouth daily. 45 tablet 1  . Cholecalciferol (VITAMIN D) 1000 UNITS capsule Take 1,000 Units by mouth daily.      . cimetidine (TAGAMET) 200 MG tablet Take 0.5 tablets (100 mg total) by mouth daily. 45 tablet 1  . Coconut Oil 1000 MG CAPS Take 1,000 mg by mouth daily.     . Coenzyme Q10 (COQ10) 100 MG CAPS Take 1 capsule by mouth daily.     . cyclobenzaprine (FLEXERIL) 10 MG tablet Take 1 tablet (10 mg total) by mouth daily. 30 tablet 5  . estradiol (ESTRACE) 0.5 MG tablet Take 0.25 mg by mouth daily.     . famciclovir (FAMVIR) 250 MG tablet Take 0.5 tablets (125 mg total) by mouth daily. 30 tablet 5  . levothyroxine (SYNTHROID, LEVOTHROID) 75 MCG tablet TAKE ONE (1) TABLET BY MOUTH EACH DAY 90 tablet 1  . lisinopril (PRINIVIL,ZESTRIL) 2.5 MG tablet Take 1  tablet (2.5 mg total) by mouth daily. 30 tablet 12  . metoprolol succinate (TOPROL XL) 25 MG 24 hr tablet Take 1 tablet (25 mg total) by mouth at bedtime. 90 tablet 3  . NON FORMULARY Take 2 tablets by mouth daily. Bone-Up: Calcium Supplement    . vitamin C (ASCORBIC ACID) 500 MG tablet Take 1,000 mg by mouth daily.     . vitamin E 400 UNIT capsule Take 400 Units by mouth daily.       No current facility-administered medications on file prior to visit.     BP (!) 102/55 (BP Location: Left Arm, Patient Position: Sitting, Cuff Size: Small)   Pulse 72   Temp 98.1 F (36.7 C) (Oral)   Resp 16   Ht 5\' 3"  (1.6 m)   Wt 120 lb (54.4 kg)   SpO2 99%   BMI 21.26 kg/m       Objective:   Physical Exam  Constitutional: She is oriented to person, place, and time. She appears well-developed and well-nourished.  HENT:  Head: Normocephalic and atraumatic.  Right Ear: Tympanic membrane and ear canal normal.  Left Ear: Tympanic membrane and ear canal normal.  Mouth/Throat: No oropharyngeal exudate, posterior oropharyngeal edema or posterior oropharyngeal erythema.  Cardiovascular: Normal rate, regular rhythm and normal heart sounds.  No murmur heard. Pulmonary/Chest: Effort normal and breath sounds normal. No respiratory distress. She has no wheezes.  Musculoskeletal: She exhibits no edema.  Tenderness to palpation between scapula.  Lymphadenopathy:    She has no cervical adenopathy.  Neurological: She is alert and oriented to person, place, and time.  Skin: Skin is warm and dry.  Psychiatric: She has a normal mood and affect. Her behavior is normal. Judgment and thought content normal.          Assessment & Plan:  Otalgia- ? Eustachian tube dysfunction, add flonase and claritin.   Chest tightness- atypical in nature.  I think this likely has a viral etiology and is pulmonary in nature.  EKG is performed and is personally reviewed. Notes ST depression I,  II, III.  ST elevation in V1,  v2, TW inversion V4 and V5. EKG is compared to previous EKG on file and appears unchanged. She is followed by cardiology. Chest tightness is not worsened with exertion.  Will obtain CXR to further evaluate and give albuterol neb today in office.   I will order a d-dimer today as well as a troponin.  If d-dimer is elevated would plan for CT angios of the chest.

## 2017-08-08 NOTE — Telephone Encounter (Signed)
Jaclyn Bennett, from [employer] calling with stat labs collected on 2/25 at 3:13. Pt has a elevated D-dimer quantitative- 0.51. Results will also be faxed to [facility]. Dr. Burnice Logan, on-call provider called an given lab results.

## 2017-08-08 NOTE — Progress Notes (Signed)
ekg 

## 2017-08-08 NOTE — Patient Instructions (Addendum)
Add claritin 10mg  once daily and flonase 2 sprays each nostril once daily to help with ear pain.  Complete lab work prior to leaving. Complete chest x ray on the first floor. Go to the ER if worsening symptoms.

## 2017-08-09 MED ORDER — AMOXICILLIN-POT CLAVULANATE 875-125 MG PO TABS: 1.0000 | ORAL_TABLET | Freq: Two times a day (BID) | ORAL | 0 refills | Status: DC

## 2017-08-09 NOTE — Telephone Encounter (Signed)
Wrong office- forwarding to [facility].

## 2017-08-09 NOTE — Telephone Encounter (Signed)
Reviewed mildly + D dimer.  Cr is WNL. Unfortunately, pt has allergy to Iodine.  Will try to arrange VQ scan. She does not wish to drive to [place]. Will try to set up here in [place]. Also, reports "feeling bad." Lots of sinus pressure and drainage. Upon further questioning she now reports symptoms have been present x 2 weeks. Will rx with augmentin.

## 2017-08-10 NOTE — Telephone Encounter (Signed)
No other suggestions at this time

## 2017-08-10 NOTE — Telephone Encounter (Signed)
Pt was scheduled for V/Q yesterday afternoon at [facility]. I have not received results and cannot view in care everywhere. Can you please contact [facility] and request results?

## 2017-08-10 NOTE — Telephone Encounter (Signed)
Notified pt. She reports that she still doesn't feel right and will continue to take it easy. States she will call if she doesn't continue to improve or symptoms worsen.  Please advise if any other recommendation?

## 2017-08-10 NOTE — Telephone Encounter (Signed)
Melissa-- it looks like report is available in [facility] now as well.  Called Radiology at [facility] and requested report be faxed to front office fax. Awaiting report.

## 2017-08-10 NOTE — Telephone Encounter (Signed)
Please contact pt and let her know that her V/Q scan is negative for clot in her lungs.  Good news.

## 2017-08-15 ENCOUNTER — Telehealth: Payer: Self-pay | Admitting: Cardiology

## 2017-08-15 ENCOUNTER — Telehealth: Payer: Self-pay | Admitting: Physician Assistant

## 2017-08-15 ENCOUNTER — Ambulatory Visit: Payer: Medicare Other

## 2017-08-15 NOTE — Telephone Encounter (Signed)
Copied from [place]. Topic: Quick Communication - Lab Results >> Aug 15, 2017  9:10 AM Darl Householder, RMA wrote: Patient is requesting lab results to please be explained

## 2017-08-15 NOTE — Telephone Encounter (Signed)
Spoke with pt, for the last 2 weeks she has had an increase in fatigue. She also reports SOB and tightness in her chest as well. She was seen by her PCP and they did a lot of workup and everything is normal. She has no edema and no orthopnea. Her bp is running 102/52 to 106/60. She was offered an appointment but she is going to wait because she has been very busy and feels she may have done too much. She will call back by the end of the week if no improvement.

## 2017-08-15 NOTE — Telephone Encounter (Signed)
New Message   Pt c/o Shortness Of Breath: STAT if SOB developed within the last 24 hours or pt is noticeably SOB on the phone  1. Are you currently SOB (can you hear that pt is SOB on the phone)? No, patient is not currently short of breath and she does not sound sob.   2. How long have you been experiencing SOB? On and off for about two weeks  3. Are you SOB when sitting or when up moving around? Sitting or moving   4. Are you currently experiencing any other symptoms? Extremely fatigue

## 2017-08-15 NOTE — Telephone Encounter (Signed)
She has had mild elevation of her glucose in the past and this is not new for her. No further work up needed.

## 2017-08-15 NOTE — Telephone Encounter (Signed)
Spoke with pt. She is concerned about glucose reading of 131 on 08/08/17 bmet. Pt states she was not fasting the day that lab was taken. Advised pt that it may be ok for her glucose at that level as it was non-fasting but I would check with PCP to see if any further testing is needed. Advised pt that if nothing else is needed we would not call her back and she is in agreement. Please advise?

## 2017-08-22 ENCOUNTER — Other Ambulatory Visit: Payer: Self-pay | Admitting: Physician Assistant

## 2017-09-12 ENCOUNTER — Other Ambulatory Visit: Payer: Self-pay | Admitting: Physician Assistant

## 2017-09-12 DIAGNOSIS — M797 Fibromyalgia: Secondary | ICD-10-CM

## 2017-09-12 NOTE — Telephone Encounter (Signed)
Last OV 03/28/2017, no future OV scheduled  Last filled 02/15/2017, 30 tablets with 5 refills

## 2017-10-03 ENCOUNTER — Ambulatory Visit
Admission: RE | Admit: 2017-10-03 | Discharge: 2017-10-03 | Disposition: A | Discharge status: Home / Self Care | Payer: Medicare Other | Source: Ambulatory Visit | Attending: Obstetrics and Gynecology | Admitting: Obstetrics and Gynecology

## 2017-10-03 DIAGNOSIS — Z1231 Encounter for screening mammogram for malignant neoplasm of breast: Secondary | ICD-10-CM

## 2017-10-20 ENCOUNTER — Ambulatory Visit: Payer: Medicare Other | Admitting: Family Medicine

## 2017-10-20 ENCOUNTER — Encounter: Payer: Self-pay | Admitting: Family Medicine

## 2017-10-20 VITALS — BP 126/74 | HR 74 | Temp 98.0°F | Resp 16 | Ht 63.0 in | Wt 119.0 lb

## 2017-10-20 DIAGNOSIS — R1031 Right lower quadrant pain: Secondary | ICD-10-CM

## 2017-10-20 DIAGNOSIS — R109 Unspecified abdominal pain: Secondary | ICD-10-CM | POA: Diagnosis not present

## 2017-10-20 LAB — POC URINALSYSI DIPSTICK (AUTOMATED)
BILIRUBIN UA: NEGATIVE
GLUCOSE UA: NEGATIVE
Ketones, UA: NEGATIVE
Leukocytes, UA: NEGATIVE
Nitrite, UA: NEGATIVE
PH UA: 6 (ref 5.0–8.0)
Protein, UA: NEGATIVE
RBC UA: NEGATIVE
UROBILINOGEN UA: 0.2 U/dL

## 2017-10-20 LAB — COMPREHENSIVE METABOLIC PANEL
AG RATIO: 1.8 (calc) (ref 1.0–2.5)
ALT: 31 U/L — ABNORMAL HIGH (ref 6–29)
AST: 31 U/L (ref 10–35)
Albumin: 4.2 g/dL (ref 3.6–5.1)
Alkaline phosphatase (APISO): 94 U/L (ref 33–130)
BILIRUBIN TOTAL: 0.4 mg/dL (ref 0.2–1.2)
BUN: 18 mg/dL (ref 7–25)
CALCIUM: 9.5 mg/dL (ref 8.6–10.4)
CO2: 31 mmol/L (ref 20–32)
Chloride: 103 mmol/L (ref 98–110)
Creat: 0.79 mg/dL (ref 0.50–0.99)
Globulin: 2.4 g/dL (calc) (ref 1.9–3.7)
Glucose, Bld: 106 mg/dL — ABNORMAL HIGH (ref 65–99)
Potassium: 5 mmol/L (ref 3.5–5.3)
Sodium: 139 mmol/L (ref 135–146)
Total Protein: 6.6 g/dL (ref 6.1–8.1)

## 2017-10-20 LAB — CBC WITH DIFFERENTIAL/PLATELET
BASOS ABS: 36 {cells}/uL (ref 0–200)
BASOS PCT: 0.4 %
EOS PCT: 1.1 %
Eosinophils Absolute: 98 cells/uL (ref 15–500)
HCT: 38.3 % (ref 35.0–45.0)
HEMOGLOBIN: 13.3 g/dL (ref 11.7–15.5)
LYMPHS ABS: 1522 {cells}/uL (ref 850–3900)
MCH: 31.1 pg (ref 27.0–33.0)
MCHC: 34.7 g/dL (ref 32.0–36.0)
MCV: 89.7 fL (ref 80.0–100.0)
MONOS PCT: 8.9 %
MPV: 9.1 fL (ref 7.5–12.5)
Neutro Abs: 6453 cells/uL (ref 1500–7800)
Neutrophils Relative %: 72.5 %
Platelets: 325 10*3/uL (ref 140–400)
RBC: 4.27 10*6/uL (ref 3.80–5.10)
RDW: 11.9 % (ref 11.0–15.0)
Total Lymphocyte: 17.1 %
WBC mixed population: 792 cells/uL (ref 200–950)
WBC: 8.9 10*3/uL (ref 3.8–10.8)

## 2017-10-20 MED ORDER — DIPHENHYDRAMINE HCL 50 MG/ML IJ SOLN: INTRAMUSCULAR | 0 refills | Status: DC

## 2017-10-20 MED ORDER — PREDNISONE 50 MG PO TABS: ORAL_TABLET | ORAL | 0 refills | Status: DC

## 2017-10-20 NOTE — Progress Notes (Signed)
Patient ID: Jaclyn Bennett, female    DOB: 1950/06/16  Age: 67 y.o. MRN: [id number]    Subjective:  Subjective  HPI Jaclyn Bennett presents for R LQ pain since yesterday am     +  Nausea, no constipation / diarrhea.  Pain with walking , sitting and laying down.  No fever/ chills.  Pt describes pain as severe.    Review of Systems  Constitutional: Negative for appetite change, diaphoresis, fatigue and unexpected weight change.  Eyes: Negative for pain, redness and visual disturbance.  Respiratory: Negative for cough, chest tightness, shortness of breath and wheezing.   Cardiovascular: Negative for chest pain, palpitations and leg swelling.  Gastrointestinal: Positive for abdominal distention, abdominal pain and nausea. Negative for anal bleeding, blood in stool, constipation, diarrhea, rectal pain and vomiting.  Endocrine: Negative for cold intolerance, heat intolerance, polydipsia, polyphagia and polyuria.  Genitourinary: Negative for difficulty urinating, dysuria and frequency.  Neurological: Negative for dizziness, light-headedness, numbness and headaches.    History Past Medical History:  Diagnosis Date  . Acute asthmatic bronchitis   . Anxiety   . Fibromyalgia   . Herpes simplex type 2 infection 03/30/2016  . Hypercholesterolemia   . Hypothyroidism   . Interstitial cystitis     She has a past surgical history that includes Total abdominal hysterectomy (1997) and left heart catheterization with coronary angiogram (Bilateral, 03/13/2014).   Her family history includes Alzheimer's disease in her father; Heart failure in her father and mother; Hyperlipidemia in her sister; Other in her mother and sister; Pancreatic cancer in her brother; Pneumonia in her mother; Stroke in her father.She reports that she has never smoked. She has never used smokeless tobacco. She reports that she drinks alcohol. She reports that she does not use drugs.  Current Outpatient Medications on File Prior to  Visit  Medication Sig Dispense Refill  . aspirin 81 MG tablet Take 81 mg by mouth daily.      Marland Kitchen atorvastatin (LIPITOR) 20 MG tablet Take 0.5 tablets (10 mg total) by mouth daily. 45 tablet 1  . Cholecalciferol (VITAMIN D) 1000 UNITS capsule Take 1,000 Units by mouth daily.      . cimetidine (TAGAMET) 200 MG tablet TAKE 1/2 TABLET DAILY 45 tablet 1  . Coconut Oil 1000 MG CAPS Take 1,000 mg by mouth daily.     . Coenzyme Q10 (COQ10) 100 MG CAPS Take 1 capsule by mouth daily.     . cyclobenzaprine (FLEXERIL) 10 MG tablet TAKE ONE (1) TABLET BY MOUTH EACH DAY 30 tablet 5  . estradiol (ESTRACE) 0.5 MG tablet Take 0.25 mg by mouth daily.     . famciclovir (FAMVIR) 250 MG tablet Take 0.5 tablets (125 mg total) by mouth daily. 30 tablet 5  . levothyroxine (SYNTHROID, LEVOTHROID) 75 MCG tablet TAKE ONE (1) TABLET BY MOUTH EACH DAY 90 tablet 1  . lisinopril (PRINIVIL,ZESTRIL) 2.5 MG tablet Take 1 tablet (2.5 mg total) by mouth daily. 30 tablet 12  . metoprolol succinate (TOPROL XL) 25 MG 24 hr tablet Take 1 tablet (25 mg total) by mouth at bedtime. 90 tablet 3  . NON FORMULARY Take 2 tablets by mouth daily. Bone-Up: Calcium Supplement    . vitamin C (ASCORBIC ACID) 500 MG tablet Take 1,000 mg by mouth daily.     . vitamin E 400 UNIT capsule Take 400 Units by mouth daily.       No current facility-administered medications on file prior to visit.  Objective:  Objective  Physical Exam  Constitutional: She is oriented to person, place, and time. She appears well-developed and well-nourished.  HENT:  Head: Normocephalic and atraumatic.  Eyes: Conjunctivae and EOM are normal.  Neck: Normal range of motion. Neck supple. No JVD present. Carotid bruit is not present. No thyromegaly present.  Cardiovascular: Normal rate, regular rhythm and normal heart sounds.  No murmur heard. Pulmonary/Chest: Effort normal and breath sounds normal. No respiratory distress. She has no wheezes. She has no rales. She  exhibits no tenderness.  Abdominal: She exhibits distension. She exhibits no mass. There is tenderness. There is guarding. There is no rebound.  RLQ pain   Musculoskeletal: She exhibits no edema.  Neurological: She is alert and oriented to person, place, and time.  Psychiatric: She has a normal mood and affect.  Nursing note and vitals reviewed.  BP 126/74 (BP Location: Right Arm, Patient Position: Sitting, Cuff Size: Normal)   Pulse 74   Temp 98 F (36.7 C) (Oral)   Resp 16   Ht 5\' 3"  (1.6 m)   Wt 119 lb (54 kg)   SpO2 98%   BMI 21.08 kg/m  Wt Readings from Last 3 Encounters:  10/20/17 119 lb (54 kg)  08/08/17 120 lb (54.4 kg)  05/25/17 120 lb (54.4 kg)     Lab Results  Component Value Date   WBC 4.9 07/14/2016   HGB 12.5 07/14/2016   HCT 37.4 07/14/2016   PLT 325 07/14/2016   GLUCOSE 131 (H) 08/08/2017   CHOL 182 08/09/2016   TRIG 124.0 09/14/2016   HDL 45.50 08/09/2016   LDLDIRECT 92.0 08/09/2016   LDLCALC 64 02/10/2016   ALT 23 02/15/2017   AST 21 02/15/2017   NA 139 08/08/2017   K 3.5 08/08/2017   CL 103 08/08/2017   CREATININE 0.86 08/08/2017   BUN 21 08/08/2017   CO2 29 08/08/2017   TSH 2.38 02/15/2017   INR 1.03 03/04/2014   HGBA1C 5.8 08/12/2016    Mm Screening Breast Tomo Bilateral  Result Date: 10/03/2017 CLINICAL DATA:  Screening. EXAM: DIGITAL SCREENING BILATERAL MAMMOGRAM WITH TOMO AND CAD COMPARISON:  Previous exam(s). ACR Breast Density Category b: There are scattered areas of fibroglandular density. FINDINGS: There are no findings suspicious for malignancy. There are bilateral circumscribed masses consistent with the patient's known history of breast cysts. Images were processed with CAD. IMPRESSION: No mammographic evidence of malignancy. A result letter of this screening mammogram will be mailed directly to the patient. RECOMMENDATION: Screening mammogram in one year. (Code:SM-B-01Y) BI-RADS CATEGORY  1: Negative. Electronically Signed   By:  Curlene Dolphin M.D.   On: 10/03/2017 16:41     Assessment & Plan:  Plan  I have discontinued Lemmie Evens. Abdullah's amoxicillin-clavulanate. I am also having her start on predniSONE and diphenhydrAMINE. Additionally, I am having her maintain her aspirin, CoQ10, vitamin C, Vitamin D, vitamin E, NON FORMULARY, Coconut Oil, estradiol, lisinopril, famciclovir, atorvastatin, levothyroxine, metoprolol succinate, cimetidine, and cyclobenzaprine.  Meds ordered this encounter  Medications  . predniSONE (DELTASONE) 50 MG tablet    Sig: 1 po at 13 hours, 7 hours and 1 hour prior to study    Dispense:  3 tablet    Refill:  0  . diphenhydrAMINE (BENADRYL) 50 MG/ML injection    Sig: 1 po 1 hour prior to study    Dispense:  1 mL    Refill:  0    Problem List Items Addressed This Visit    None  Visit Diagnoses    Flank pain    -  Primary   Relevant Orders   POCT Urinalysis Dipstick (Automated) (Completed)   Comprehensive metabolic panel   CBC w/Diff   Abdominal pain, RLQ       Right lower quadrant abdominal pain       Relevant Medications   predniSONE (DELTASONE) 50 MG tablet   Other Relevant Orders   Comprehensive metabolic panel   CBC w/Diff   CT Abdomen Pelvis Wo Contrast    check labs  Ct abd Pt wants ct tomorrow---- pt instructed to go to er if pain worsens   Follow-up: Return if symptoms worsen or fail to improve.  [name], DO

## 2017-10-20 NOTE — Patient Instructions (Signed)

## 2017-10-21 ENCOUNTER — Other Ambulatory Visit: Payer: Self-pay | Admitting: *Deleted

## 2017-10-21 ENCOUNTER — Ambulatory Visit (HOSPITAL_BASED_OUTPATIENT_CLINIC_OR_DEPARTMENT_OTHER)
Admission: RE | Admit: 2017-10-21 | Discharge: 2017-10-21 | Disposition: A | Discharge status: Home / Self Care | Payer: Medicare Other | Source: Ambulatory Visit | Attending: Family Medicine | Admitting: Family Medicine

## 2017-10-21 DIAGNOSIS — R1031 Right lower quadrant pain: Secondary | ICD-10-CM | POA: Insufficient documentation

## 2017-10-24 ENCOUNTER — Encounter: Payer: Self-pay | Admitting: Family Medicine

## 2017-10-31 ENCOUNTER — Ambulatory Visit: Payer: Medicare Other | Admitting: Family Medicine

## 2017-10-31 ENCOUNTER — Encounter: Payer: Self-pay | Admitting: Family Medicine

## 2017-10-31 VITALS — BP 130/60 | HR 81 | Temp 98.3°F | Resp 16 | Ht 63.0 in | Wt 119.2 lb

## 2017-10-31 DIAGNOSIS — K219 Gastro-esophageal reflux disease without esophagitis: Secondary | ICD-10-CM | POA: Diagnosis not present

## 2017-10-31 DIAGNOSIS — J029 Acute pharyngitis, unspecified: Secondary | ICD-10-CM

## 2017-10-31 DIAGNOSIS — E785 Hyperlipidemia, unspecified: Secondary | ICD-10-CM | POA: Diagnosis not present

## 2017-10-31 LAB — POCT RAPID STREP A (OFFICE): Rapid Strep A Screen: NEGATIVE

## 2017-10-31 MED ORDER — CIMETIDINE 200 MG PO TABS: 100.0000 mg | ORAL_TABLET | Freq: Every day | ORAL | 1 refills | Status: DC

## 2017-10-31 MED ORDER — LORATADINE 10 MG PO TABS: 10.0000 mg | ORAL_TABLET | Freq: Every day | ORAL | 11 refills | Status: DC

## 2017-10-31 MED ORDER — AMOXICILLIN 875 MG PO TABS
875.0000 mg | ORAL_TABLET | Freq: Two times a day (BID) | ORAL | 0 refills | Status: DC
Start: 2017-10-31 — End: 2018-03-22

## 2017-10-31 MED ORDER — ATORVASTATIN CALCIUM 20 MG PO TABS: 10.0000 mg | ORAL_TABLET | Freq: Every day | ORAL | 1 refills | Status: DC

## 2017-10-31 NOTE — Assessment & Plan Note (Signed)
abx per orders Culture send Use flonase and claritin for runny nose / congestion

## 2017-10-31 NOTE — Patient Instructions (Signed)

## 2017-10-31 NOTE — Progress Notes (Signed)
Patient ID: Jaclyn Bennett, female   DOB: 1951/04/01, 67 y.o.   MRN: [id number]     Subjective:  I acted as a Education administrator for Dr. Carollee Bennett.  Jaclyn Bennett, Jaclyn Bennett   Patient ID: Jaclyn Bennett, female    DOB: 06-18-1950, 67 y.o.   MRN: [id number]  Chief Complaint  Patient presents with  . Sore Throat    Sore Throat   This is a new problem. Episode onset: friday. Associated symptoms include congestion (nasal), coughing and a plugged ear sensation (both). Pertinent negatives include no abdominal pain, diarrhea, ear discharge, ear pain, headaches, shortness of breath or vomiting. Associated symptoms comments: Chest burns .   Patient is in today for sore throat.  Past Medical History:  Diagnosis Date  . Acute asthmatic bronchitis   . Anxiety   . Fibromyalgia   . Herpes simplex type 2 infection 03/30/2016  . Hypercholesterolemia   . Hypothyroidism   . Interstitial cystitis     Past Surgical History:  Procedure Laterality Date  . LEFT HEART CATHETERIZATION WITH CORONARY ANGIOGRAM Bilateral 03/13/2014   Procedure: LEFT HEART CATHETERIZATION WITH CORONARY ANGIOGRAM;  Surgeon: Jaclyn Ohara, MD;  Location: [facility] CATH LAB;  Service: Cardiovascular;  Laterality: Bilateral;  . TOTAL ABDOMINAL HYSTERECTOMY  1997    Family History  Problem Relation Age of Onset  . Other Mother        bronchiectasis  . Pneumonia Mother   . Heart failure Mother   . Alzheimer's disease Father   . Stroke Father   . Heart failure Father   . Pancreatic cancer Brother   . Hyperlipidemia Sister   . Other Sister        bronchiectasis    Social History   Socioeconomic History  . Marital status: Divorced    Spouse name: Not on file  . Number of children: 3  . Years of education: Not on file  . Highest education level: Not on file  Occupational History  . Occupation: Education officer, museum - substitute teaching now  Social Needs  . Financial resource strain: Not on file  . Food insecurity:    Worry: Not on file   Inability: Not on file  . Transportation needs:    Medical: Not on file    Non-medical: Not on file  Tobacco Use  . Smoking status: Never Smoker  . Smokeless tobacco: Never Used  Substance and Sexual Activity  . Alcohol use: Yes    Comment: Rare  . Drug use: No  . Sexual activity: Not on file  Lifestyle  . Physical activity:    Days per week: Not on file    Minutes per session: Not on file  . Stress: Not on file  Relationships  . Social connections:    Talks on phone: Not on file    Gets together: Not on file    Attends religious service: Not on file    Active member of club or organization: Not on file    Attends meetings of clubs or organizations: Not on file    Relationship status: Not on file  . Intimate partner violence:    Fear of current or ex partner: Not on file    Emotionally abused: Not on file    Physically abused: Not on file    Forced sexual activity: Not on file  Other Topics Concern  . Not on file  Social History Narrative   Plays piano, 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   Declines flu and pneumonia shot 04-30-10  Outpatient Medications Prior to Visit  Medication Sig Dispense Refill  . aspirin 81 MG tablet Take 81 mg by mouth daily.      . Cholecalciferol (VITAMIN D) 1000 UNITS capsule Take 1,000 Units by mouth daily.      . Coconut Oil 1000 MG CAPS Take 1,000 mg by mouth daily.     . Coenzyme Q10 (COQ10) 100 MG CAPS Take 1 capsule by mouth daily.     . cyclobenzaprine (FLEXERIL) 10 MG tablet TAKE ONE (1) TABLET BY MOUTH EACH DAY 30 tablet 5  . estradiol (ESTRACE) 0.5 MG tablet Take 0.25 mg by mouth daily.     . famciclovir (FAMVIR) 250 MG tablet Take 0.5 tablets (125 mg total) by mouth daily. 30 tablet 5  . levothyroxine (SYNTHROID, LEVOTHROID) 75 MCG tablet TAKE ONE (1) TABLET BY MOUTH EACH DAY 90 tablet 1  . lisinopril (PRINIVIL,ZESTRIL) 2.5 MG tablet Take 1 tablet (2.5 mg total) by mouth daily. 30 tablet 12  . metoprolol succinate (TOPROL XL) 25 MG 24 hr  tablet Take 1 tablet (25 mg total) by mouth at bedtime. 90 tablet 3  . NON FORMULARY Take 2 tablets by mouth daily. Bone-Up: Calcium Supplement    . vitamin C (ASCORBIC ACID) 500 MG tablet Take 1,000 mg by mouth daily.     . vitamin E 400 UNIT capsule Take 400 Units by mouth daily.      Marland Kitchen atorvastatin (LIPITOR) 20 MG tablet Take 0.5 tablets (10 mg total) by mouth daily. 45 tablet 1  . cimetidine (TAGAMET) 200 MG tablet TAKE 1/2 TABLET DAILY 45 tablet 1  . diphenhydrAMINE (BENADRYL) 50 MG/ML injection 1 po 1 hour prior to study 1 mL 0  . predniSONE (DELTASONE) 50 MG tablet 1 po at 13 hours, 7 hours and 1 hour prior to study 3 tablet 0   No facility-administered medications prior to visit.     Allergies  Allergen Reactions  . Iodine Anaphylaxis  . Valtrex [Valacyclovir Hcl] Other (See Comments)    Severe nausea   . Anesthetics, Amide Nausea And Vomiting  . Benadryl Allergy [Diphenhydramine Hcl (Sleep)]     Makes patient hyper  . Sulfonamide Derivatives     REACTION: throat swelling and itching    Review of Systems  Constitutional: Negative for chills, fever and malaise/fatigue.  HENT: Positive for congestion (nasal) and sore throat. Negative for ear discharge, ear pain and hearing loss.   Eyes: Negative for discharge.  Respiratory: Positive for cough. Negative for sputum production and shortness of breath.   Cardiovascular: Negative for chest pain, palpitations and leg swelling.  Gastrointestinal: Negative for abdominal pain, blood in stool, constipation, diarrhea, heartburn, nausea and vomiting.  Genitourinary: Negative for dysuria, frequency, hematuria and urgency.  Musculoskeletal: Negative for back pain, falls and myalgias.  Skin: Negative for rash.  Neurological: Negative for dizziness, sensory change, loss of consciousness, weakness and headaches.  Endo/Heme/Allergies: Negative for environmental allergies. Does not bruise/bleed easily.  Psychiatric/Behavioral: Negative for  depression and suicidal ideas. The patient is not nervous/anxious and does not have insomnia.        Objective:    Physical Exam  Constitutional: She is oriented to person, place, and time. She appears well-developed and well-nourished.  HENT:  Head: Normocephalic and atraumatic.  Right Ear: External ear normal.  Left Ear: External ear normal.  Mouth/Throat: Posterior oropharyngeal erythema present. No posterior oropharyngeal edema.  + PND + errythema  Eyes: Conjunctivae and EOM are normal. Right eye exhibits no discharge.  Left eye exhibits no discharge.  Neck: Normal range of motion. Neck supple. No JVD present. Carotid bruit is not present. No thyromegaly present.  Cardiovascular: Normal rate, regular rhythm and normal heart sounds.  No murmur heard. Pulmonary/Chest: Effort normal and breath sounds normal. No respiratory distress. She has no wheezes. She has no rales. She exhibits no tenderness.  Musculoskeletal: She exhibits no edema.  Lymphadenopathy:    She has cervical adenopathy.  Neurological: She is alert and oriented to person, place, and time.  Psychiatric: She has a normal mood and affect.  Nursing note and vitals reviewed.   BP 130/60 (BP Location: Left Arm, Cuff Size: Normal)   Pulse 81   Temp 98.3 F (36.8 C) (Oral)   Resp 16   Ht 5\' 3"  (1.6 m)   Wt 119 lb 3.2 oz (54.1 kg)   SpO2 99%   BMI 21.12 kg/m  Wt Readings from Last 3 Encounters:  10/31/17 119 lb 3.2 oz (54.1 kg)  10/20/17 119 lb (54 kg)  08/08/17 120 lb (54.4 kg)     Lab Results  Component Value Date   WBC 8.9 10/20/2017   HGB 13.3 10/20/2017   HCT 38.3 10/20/2017   PLT 325 10/20/2017   GLUCOSE 106 (H) 10/20/2017   CHOL 182 08/09/2016   TRIG 124.0 09/14/2016   HDL 45.50 08/09/2016   LDLDIRECT 92.0 08/09/2016   LDLCALC 64 02/10/2016   ALT 31 (H) 10/20/2017   AST 31 10/20/2017   NA 139 10/20/2017   K 5.0 10/20/2017   CL 103 10/20/2017   CREATININE 0.79 10/20/2017   BUN 18 10/20/2017     CO2 31 10/20/2017   TSH 2.38 02/15/2017   INR 1.03 03/04/2014   HGBA1C 5.8 08/12/2016    Lab Results  Component Value Date   TSH 2.38 02/15/2017   Lab Results  Component Value Date   WBC 8.9 10/20/2017   HGB 13.3 10/20/2017   HCT 38.3 10/20/2017   MCV 89.7 10/20/2017   PLT 325 10/20/2017   Lab Results  Component Value Date   NA 139 10/20/2017   K 5.0 10/20/2017   CO2 31 10/20/2017   GLUCOSE 106 (H) 10/20/2017   BUN 18 10/20/2017   CREATININE 0.79 10/20/2017   BILITOT 0.4 10/20/2017   ALKPHOS 95 02/15/2017   AST 31 10/20/2017   ALT 31 (H) 10/20/2017   PROT 6.6 10/20/2017   ALBUMIN 4.2 02/15/2017   CALCIUM 9.5 10/20/2017   GFR 69.94 08/08/2017   Lab Results  Component Value Date   CHOL 182 08/09/2016   Lab Results  Component Value Date   HDL 45.50 08/09/2016   Lab Results  Component Value Date   LDLCALC 64 02/10/2016   Lab Results  Component Value Date   TRIG 124.0 09/14/2016   Lab Results  Component Value Date   CHOLHDL 4 08/09/2016   Lab Results  Component Value Date   HGBA1C 5.8 08/12/2016       Assessment & Plan:   Problem List Items Addressed This Visit      Unprioritized   Hyperlipidemia LDL goal <100    Tolerating statin, encouraged heart healthy diet, avoid trans fats, minimize simple carbs and saturated fats. Increase exercise as tolerated      Relevant Medications   atorvastatin (LIPITOR) 20 MG tablet   Other Relevant Orders   Lipid panel   Comprehensive metabolic panel   Pharyngitis    abx per orders Culture send Use flonase and claritin for  runny nose / congestion       Relevant Medications   amoxicillin (AMOXIL) 875 MG tablet    Other Visit Diagnoses    Sore throat    -  Primary   Relevant Medications   loratadine (CLARITIN) 10 MG tablet   Other Relevant Orders   POCT rapid strep A (Completed)   Culture, Group A Strep   Gastroesophageal reflux disease, esophagitis presence not specified       Relevant  Medications   cimetidine (TAGAMET) 200 MG tablet      I have discontinued Lemmie Evens. Garramone's predniSONE and diphenhydrAMINE. I have also changed her cimetidine. Additionally, I am having her start on amoxicillin and loratadine. Lastly, I am having her maintain her aspirin, CoQ10, vitamin C, Vitamin D, vitamin E, NON FORMULARY, Coconut Oil, estradiol, lisinopril, famciclovir, levothyroxine, metoprolol succinate, cyclobenzaprine, and atorvastatin.  Meds ordered this encounter  Medications  . amoxicillin (AMOXIL) 875 MG tablet    Sig: Take 1 tablet (875 mg total) by mouth 2 (two) times daily.    Dispense:  20 tablet    Refill:  0  . loratadine (CLARITIN) 10 MG tablet    Sig: Take 1 tablet (10 mg total) by mouth daily.    Dispense:  30 tablet    Refill:  11  . atorvastatin (LIPITOR) 20 MG tablet    Sig: Take 0.5 tablets (10 mg total) by mouth daily.    Dispense:  45 tablet    Refill:  1    Please hold until patient requests.  . cimetidine (TAGAMET) 200 MG tablet    Sig: Take 0.5 tablets (100 mg total) by mouth daily.    Dispense:  45 tablet    Refill:  1    CMA served as scribe during this visit. History, Physical and Plan performed by medical provider. Documentation and orders reviewed and attested to.  [name], DO

## 2017-10-31 NOTE — Assessment & Plan Note (Signed)
Tolerating statin, encouraged heart healthy diet, avoid trans fats, minimize simple carbs and saturated fats. Increase exercise as tolerated 

## 2017-11-02 LAB — CULTURE, GROUP A STREP
MICRO NUMBER: 90610234
SPECIMEN QUALITY: ADEQUATE

## 2017-11-14 ENCOUNTER — Other Ambulatory Visit (INDEPENDENT_AMBULATORY_CARE_PROVIDER_SITE_OTHER): Payer: Medicare Other

## 2017-11-14 ENCOUNTER — Other Ambulatory Visit: Payer: Self-pay | Admitting: Cardiology

## 2017-11-14 DIAGNOSIS — E785 Hyperlipidemia, unspecified: Secondary | ICD-10-CM | POA: Diagnosis not present

## 2017-11-14 LAB — COMPREHENSIVE METABOLIC PANEL
ALT: 22 U/L (ref 0–35)
AST: 19 U/L (ref 0–37)
Albumin: 4.1 g/dL (ref 3.5–5.2)
Alkaline Phosphatase: 86 U/L (ref 39–117)
BILIRUBIN TOTAL: 0.4 mg/dL (ref 0.2–1.2)
BUN: 16 mg/dL (ref 6–23)
CHLORIDE: 104 meq/L (ref 96–112)
CO2: 31 meq/L (ref 19–32)
CREATININE: 0.79 mg/dL (ref 0.40–1.20)
Calcium: 9.3 mg/dL (ref 8.4–10.5)
GFR: 77.08 mL/min (ref >60.00)
GLUCOSE: 98 mg/dL (ref 70–99)
Potassium: 4.3 mEq/L (ref 3.5–5.1)
SODIUM: 142 meq/L (ref 135–145)
Total Protein: 6.5 g/dL (ref 6.0–8.3)

## 2017-11-14 LAB — LIPID PANEL
CHOL/HDL RATIO: 4
Cholesterol: 177 mg/dL (ref 0–200)
HDL: 41.8 mg/dL (ref >39.00)
LDL CALC: 104 mg/dL — AB (ref 0–99)
NONHDL: 134.81
Triglycerides: 156 mg/dL — ABNORMAL HIGH (ref 0.0–149.0)
VLDL: 31.2 mg/dL (ref 0.0–40.0)

## 2017-12-19 ENCOUNTER — Telehealth: Payer: Self-pay | Admitting: Cardiology

## 2017-12-19 MED ORDER — LISINOPRIL 2.5 MG PO TABS: ORAL_TABLET | ORAL | 0 refills | Status: DC

## 2017-12-19 NOTE — Addendum Note (Signed)
Addended by: Waylan Rocher on: 12/19/2017 02:14 PM   Modules accepted: Orders

## 2017-12-19 NOTE — Telephone Encounter (Signed)
Pt calling.   *STAT* If patient is at the pharmacy, call can be transferred to refill team.   1. Which medications need to be refilled? (please list name of each medication and dose if known) Lisinopril 2.5mg    2. Which pharmacy/location (including street and city if local pharmacy) is medication to be sent to?[facility]  3. Do they need a 30 day or 90 day supply? [place]

## 2018-02-13 ENCOUNTER — Encounter: Payer: Self-pay | Admitting: Family Medicine

## 2018-02-14 ENCOUNTER — Other Ambulatory Visit: Payer: Self-pay | Admitting: Family Medicine

## 2018-02-14 DIAGNOSIS — E039 Hypothyroidism, unspecified: Secondary | ICD-10-CM

## 2018-02-14 MED ORDER — LEVOTHYROXINE SODIUM 75 MCG PO TABS: ORAL_TABLET | ORAL | 1 refills | Status: DC

## 2018-03-07 ENCOUNTER — Encounter: Payer: Self-pay | Admitting: Cardiology

## 2018-03-10 ENCOUNTER — Other Ambulatory Visit: Payer: Self-pay | Admitting: Physician Assistant

## 2018-03-10 DIAGNOSIS — M797 Fibromyalgia: Secondary | ICD-10-CM

## 2018-03-15 NOTE — Progress Notes (Signed)
HPI: FU cardiomyopathy; previously seen for atypical CP; stress echo 9/15 showed baseline EF 45; inferior, septal and apical HK both at rest and with stress. Cardiac catheterization September 2015 showed normal coronary arteries. Laboratories January 2018 showed BNP 13.6, normal TSH and normal hemoglobin. Echocardiogram repeated 2/18 and showed ejection fraction 71-24%, grade 1 diastolic dysfunction. Since last seen,  patient describes fatigue.  She has dyspnea with vigorous activities but not routine activities.  No orthopnea, PND or pedal edema.  She has occasional twinge of pain in her chest but no exertional chest pain.  No syncope.  Current Outpatient Medications  Medication Sig Dispense Refill  . aspirin 81 MG tablet Take 81 mg by mouth daily.      Marland Kitchen atorvastatin (LIPITOR) 20 MG tablet Take 0.5 tablets (10 mg total) by mouth daily. 45 tablet 1  . Cholecalciferol (VITAMIN D) 1000 UNITS capsule Take 1,000 Units by mouth daily.      . cimetidine (TAGAMET) 200 MG tablet Take 0.5 tablets (100 mg total) by mouth daily. 45 tablet 1  . Coconut Oil 1000 MG CAPS Take 1,000 mg by mouth daily.     . Coenzyme Q10 (COQ10) 100 MG CAPS Take 1 capsule by mouth daily.     . cyclobenzaprine (FLEXERIL) 10 MG tablet TAKE ONE (1) TABLET BY MOUTH EACH DAY 30 tablet 5  . estradiol (ESTRACE) 0.5 MG tablet Take 0.25 mg by mouth daily.     . famciclovir (FAMVIR) 250 MG tablet Take 0.5 tablets (125 mg total) by mouth daily. 30 tablet 5  . levothyroxine (SYNTHROID, LEVOTHROID) 75 MCG tablet TAKE ONE (1) TABLET BY MOUTH EACH DAY 90 tablet 1  . lisinopril (PRINIVIL,ZESTRIL) 2.5 MG tablet TAKE ONE (1) TABLET EACH DAY 90 tablet 0  . loratadine (CLARITIN) 10 MG tablet Take 1 tablet (10 mg total) by mouth daily. 30 tablet 11  . metoprolol succinate (TOPROL XL) 25 MG 24 hr tablet Take 1 tablet (25 mg total) by mouth at bedtime. 90 tablet 3  . NON FORMULARY Take 2 tablets by mouth daily. Bone-Up: Calcium Supplement      . vitamin C (ASCORBIC ACID) 500 MG tablet Take 1,000 mg by mouth daily.     . vitamin E 400 UNIT capsule Take 400 Units by mouth daily.       No current facility-administered medications for this visit.      Past Medical History:  Diagnosis Date  . Acute asthmatic bronchitis   . Anxiety   . Fibromyalgia   . Herpes simplex type 2 infection 03/30/2016  . Hypercholesterolemia   . Hypothyroidism   . Interstitial cystitis     Past Surgical History:  Procedure Laterality Date  . LEFT HEART CATHETERIZATION WITH CORONARY ANGIOGRAM Bilateral 03/13/2014   Procedure: LEFT HEART CATHETERIZATION WITH CORONARY ANGIOGRAM;  Surgeon: Blane Ohara, MD;  Location: [facility] CATH LAB;  Service: Cardiovascular;  Laterality: Bilateral;  . TOTAL ABDOMINAL HYSTERECTOMY  1997    Social History   Socioeconomic History  . Marital status: Divorced    Spouse name: Not on file  . Number of children: 3  . Years of education: Not on file  . Highest education level: Not on file  Occupational History  . Occupation: Education officer, museum - substitute teaching now  Social Needs  . Financial resource strain: Not on file  . Food insecurity:    Worry: Not on file    Inability: Not on file  . Transportation needs:  Medical: Not on file    Non-medical: Not on file  Tobacco Use  . Smoking status: Never Smoker  . Smokeless tobacco: Never Used  Substance and Sexual Activity  . Alcohol use: Yes    Comment: Rare  . Drug use: No  . Sexual activity: Not on file  Lifestyle  . Physical activity:    Days per week: Not on file    Minutes per session: Not on file  . Stress: Not on file  Relationships  . Social connections:    Talks on phone: Not on file    Gets together: Not on file    Attends religious service: Not on file    Active member of club or organization: Not on file    Attends meetings of clubs or organizations: Not on file    Relationship status: Not on file  . Intimate partner violence:    Fear of  current or ex partner: Not on file    Emotionally abused: Not on file    Physically abused: Not on file    Forced sexual activity: Not on file  Other Topics Concern  . Not on file  Social History Narrative   Plays piano, 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   Declines flu and pneumonia shot 04-30-10    Family History  Problem Relation Age of Onset  . Other Mother        bronchiectasis  . Pneumonia Mother   . Heart failure Mother   . Alzheimer's disease Father   . Stroke Father   . Heart failure Father   . Pancreatic cancer Brother   . Hyperlipidemia Sister   . Other Sister        bronchiectasis    ROS: no fevers or chills, productive cough, hemoptysis, dysphasia, odynophagia, melena, hematochezia, dysuria, hematuria, rash, seizure activity, orthopnea, PND, pedal edema, claudication. Remaining systems are negative.  Physical Exam: Well-developed well-nourished in no acute distress.  Skin is warm and dry.  HEENT is normal.  Neck is supple.  Chest is clear to auscultation with normal expansion.  Cardiovascular exam is regular rate and rhythm.  Abdominal exam nontender or distended. No masses palpated. Extremities show no edema. neuro grossly intact  ECG-sinus rhythm, nonspecific ST changes, unchanged compared to August 08, 2017.  Personally reviewed   A/P  1 nonischemic cardiomyopathy-LV function mildly reduced previously.  We will repeat echocardiogram.  Continue ACE inhibitor and beta-blocker.  She is not volume overloaded on examination.  2 hyperlipidemia-continue statin.  Managed by primary care.   3 chest pain-symptoms are atypical.  Electrocardiogram is unchanged.  No further ischemia evaluation.  Kirk Ruths, MD

## 2018-03-22 ENCOUNTER — Encounter: Payer: Self-pay | Admitting: Cardiology

## 2018-03-22 ENCOUNTER — Ambulatory Visit: Payer: Medicare Other | Admitting: Cardiology

## 2018-03-22 VITALS — BP 120/68 | HR 75 | Ht 63.0 in | Wt 119.8 lb

## 2018-03-22 DIAGNOSIS — I420 Dilated cardiomyopathy: Secondary | ICD-10-CM | POA: Diagnosis not present

## 2018-03-22 DIAGNOSIS — R0789 Other chest pain: Secondary | ICD-10-CM | POA: Diagnosis not present

## 2018-03-22 DIAGNOSIS — E7800 Pure hypercholesterolemia, unspecified: Secondary | ICD-10-CM | POA: Diagnosis not present

## 2018-03-22 MED ORDER — LISINOPRIL 2.5 MG PO TABS: ORAL_TABLET | ORAL | 3 refills | Status: DC

## 2018-03-22 NOTE — Patient Instructions (Addendum)
Medication Instructions:   NO CHANGE  Testing/Procedures:  Your physician has requested that you have an echocardiogram. Echocardiography is a painless test that uses sound waves to create images of your heart. It provides your doctor with information about the size and shape of your heart and how well your heart's chambers and valves are working. This procedure takes approximately one hour. There are no restrictions for this procedure.  AT THE [facility] [facility]  Follow-Up:  DR Stanford Breed WOULD LIKE TO SEE YOU BACK IN ONE YEAR.PLEASE CALL THE OFFICE 2 MONTHS PRIOR TO THAT APPOINTMENT TIME TO SCHEDULE.  Marland Kitchen

## 2018-03-30 ENCOUNTER — Ambulatory Visit (HOSPITAL_BASED_OUTPATIENT_CLINIC_OR_DEPARTMENT_OTHER)
Admission: RE | Admit: 2018-03-30 | Discharge: 2018-03-30 | Disposition: A | Discharge status: Home / Self Care | Payer: Medicare Other | Source: Ambulatory Visit | Attending: Cardiology | Admitting: Cardiology

## 2018-03-30 DIAGNOSIS — I340 Nonrheumatic mitral (valve) insufficiency: Secondary | ICD-10-CM | POA: Insufficient documentation

## 2018-03-30 DIAGNOSIS — I420 Dilated cardiomyopathy: Secondary | ICD-10-CM

## 2018-03-30 DIAGNOSIS — E785 Hyperlipidemia, unspecified: Secondary | ICD-10-CM | POA: Diagnosis not present

## 2018-03-30 NOTE — Progress Notes (Signed)
  Echocardiogram 2D Echocardiogram has been performed.  Jaclyn Bennett Jaclyn Bennett 03/30/2018, 3:56 PM

## 2018-04-19 ENCOUNTER — Other Ambulatory Visit: Payer: Self-pay

## 2018-04-19 ENCOUNTER — Encounter: Payer: Self-pay | Admitting: Physician Assistant

## 2018-04-19 ENCOUNTER — Ambulatory Visit: Payer: Medicare Other | Admitting: Physician Assistant

## 2018-04-19 VITALS — BP 104/60 | HR 77 | Temp 97.7°F | Resp 14 | Ht 63.0 in | Wt 121.0 lb

## 2018-04-19 DIAGNOSIS — M545 Low back pain, unspecified: Secondary | ICD-10-CM

## 2018-04-19 LAB — POCT URINALYSIS DIPSTICK
BILIRUBIN UA: NEGATIVE
Blood, UA: NEGATIVE
Glucose, UA: NEGATIVE
KETONES UA: NEGATIVE
Leukocytes, UA: NEGATIVE
Nitrite, UA: NEGATIVE
Protein, UA: NEGATIVE
SPEC GRAV UA: 1.015 (ref 1.010–1.025)
Urobilinogen, UA: 0.2 E.U./dL
pH, UA: 5 (ref 5.0–8.0)

## 2018-04-19 MED ORDER — METHYLPREDNISOLONE 4 MG PO TBPK: ORAL_TABLET | ORAL | 0 refills | Status: DC

## 2018-04-19 NOTE — Patient Instructions (Signed)
Urine testing is unremarkable. This definitely seems muscular in nature due to the significant muscular tenderness noted on exam with palpation and range of motion.  Take the steroid taper as directed. Continue Flexeril in the evening.  Tylenol for breakthrough pain. Avoid heavy lifting. Schedule follow-up with Dr. Etter Sjogren if symptoms are not improving in the next few days.

## 2018-04-19 NOTE — Progress Notes (Signed)
Patient presents to clinic today c/o 1 week of low back pain, bilateral that worsens with ROM. Denies any known trauma or injury. Has been on her feet all day long, keeping very busy. Has history of IC and UTI and is concerned that an episode is present. Denies fever, chills, vomiting, flank pain, dysuria, frequency or hematuria. Has taken Ibuprofen with little relief of symptoms.  Past Medical History:  Diagnosis Date  . Acute asthmatic bronchitis   . Anxiety   . Fibromyalgia   . Herpes simplex type 2 infection 03/30/2016  . Hypercholesterolemia   . Hypothyroidism   . Interstitial cystitis     Current Outpatient Medications on File Prior to Visit  Medication Sig Dispense Refill  . aspirin 81 MG tablet Take 81 mg by mouth daily.      Marland Kitchen atorvastatin (LIPITOR) 20 MG tablet Take 0.5 tablets (10 mg total) by mouth daily. 45 tablet 1  . Cholecalciferol (VITAMIN D) 1000 UNITS capsule Take 1,000 Units by mouth daily.      . cimetidine (TAGAMET) 200 MG tablet Take 0.5 tablets (100 mg total) by mouth daily. 45 tablet 1  . Coconut Oil 1000 MG CAPS Take 1,000 mg by mouth daily.     . Coenzyme Q10 (COQ10) 100 MG CAPS Take 1 capsule by mouth daily.     . cyclobenzaprine (FLEXERIL) 10 MG tablet TAKE ONE (1) TABLET BY MOUTH EACH DAY 30 tablet 5  . estradiol (ESTRACE) 0.5 MG tablet Take 0.25 mg by mouth daily.     . famciclovir (FAMVIR) 250 MG tablet Take 0.5 tablets (125 mg total) by mouth daily. 30 tablet 5  . levothyroxine (SYNTHROID, LEVOTHROID) 75 MCG tablet TAKE ONE (1) TABLET BY MOUTH EACH DAY 90 tablet 1  . lisinopril (PRINIVIL,ZESTRIL) 2.5 MG tablet TAKE ONE (1) TABLET EACH DAY 90 tablet 3  . loratadine (CLARITIN) 10 MG tablet Take 1 tablet (10 mg total) by mouth daily. 30 tablet 11  . metoprolol succinate (TOPROL XL) 25 MG 24 hr tablet Take 1 tablet (25 mg total) by mouth at bedtime. 90 tablet 3  . NON FORMULARY Take 2 tablets by mouth daily. Bone-Up: Calcium Supplement    . vitamin C  (ASCORBIC ACID) 500 MG tablet Take 1,000 mg by mouth daily.     . vitamin E 400 UNIT capsule Take 400 Units by mouth daily.       No current facility-administered medications on file prior to visit.     Allergies  Allergen Reactions  . Iodine Anaphylaxis  . Valtrex [Valacyclovir Hcl] Other (See Comments)    Severe nausea   . Anesthetics, Amide Nausea And Vomiting  . Benadryl Allergy [Diphenhydramine Hcl (Sleep)]     Makes patient hyper  . Sulfonamide Derivatives     REACTION: throat swelling and itching    Family History  Problem Relation Age of Onset  . Other Mother        bronchiectasis  . Pneumonia Mother   . Heart failure Mother   . Alzheimer's disease Father   . Stroke Father   . Heart failure Father   . Pancreatic cancer Brother   . Hyperlipidemia Sister   . Other Sister        bronchiectasis    Social History   Socioeconomic History  . Marital status: Divorced    Spouse name: Not on file  . Number of children: 3  . Years of education: Not on file  . Highest education level: Not on  file  Occupational History  . Occupation: Education officer, museum - substitute teaching now  Social Needs  . Financial resource strain: Not on file  . Food insecurity:    Worry: Not on file    Inability: Not on file  . Transportation needs:    Medical: Not on file    Non-medical: Not on file  Tobacco Use  . Smoking status: Never Smoker  . Smokeless tobacco: Never Used  Substance and Sexual Activity  . Alcohol use: Yes    Comment: Rare  . Drug use: No  . Sexual activity: Not on file  Lifestyle  . Physical activity:    Days per week: Not on file    Minutes per session: Not on file  . Stress: Not on file  Relationships  . Social connections:    Talks on phone: Not on file    Gets together: Not on file    Attends religious service: Not on file    Active member of club or organization: Not on file    Attends meetings of clubs or organizations: Not on file    Relationship  status: Not on file  Other Topics Concern  . Not on file  Social History Narrative   Plays piano, 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   Declines flu and pneumonia shot 04-30-10   Review of Systems - See HPI.  All other ROS are negative.  BP 104/60   Temp 97.7 F (36.5 C) (Oral)   Resp 14   Ht 5\' 3"  (1.6 m)   Wt 121 lb (54.9 kg)   BMI 21.43 kg/m   Physical Exam  Constitutional: She appears well-developed and well-nourished.  HENT:  Head: Normocephalic and atraumatic.  Eyes: Conjunctivae are normal.  Neck: Neck supple.  Cardiovascular: Normal rate, regular rhythm, normal heart sounds and intact distal pulses.  Pulmonary/Chest: Effort normal and breath sounds normal.  Musculoskeletal:       Right hip: Normal.       Left hip: Normal.       Cervical back: Normal.       Thoracic back: She exhibits tenderness and pain. She exhibits no bony tenderness.       Lumbar back: She exhibits tenderness and spasm. She exhibits no bony tenderness.  Lymphadenopathy:    She has no cervical adenopathy.  Psychiatric: She has a normal mood and affect.  Vitals reviewed.  Assessment/Plan: 1. Acute bilateral low back pain without sciatica Urine dip negative. No urinary symptoms typical for her IC flares either.  Pain with movement of torso noted. No CVA tenderness. MSK etiology most likely culprit. Start medrol along with continued OTC Tylenol and her chronic Flexeril. Follow-up with PCP if not resolving.   - POCT Urinalysis Dipstick - methylPREDNISolone (MEDROL DOSEPAK) 4 MG TBPK tablet; Please take following package directions.  Dispense: 21 tablet; Refill: 0   Leeanne Rio, PA-C

## 2018-04-24 ENCOUNTER — Encounter: Payer: Self-pay | Admitting: Family Medicine

## 2018-04-24 DIAGNOSIS — K219 Gastro-esophageal reflux disease without esophagitis: Secondary | ICD-10-CM

## 2018-04-24 MED ORDER — FAMCICLOVIR 250 MG PO TABS: 125.0000 mg | ORAL_TABLET | Freq: Every day | ORAL | 1 refills | Status: DC

## 2018-04-24 MED ORDER — CIMETIDINE 200 MG PO TABS: 100.0000 mg | ORAL_TABLET | Freq: Every day | ORAL | 1 refills | Status: DC

## 2018-04-28 ENCOUNTER — Ambulatory Visit: Payer: Medicare Other | Admitting: Medical

## 2018-04-28 ENCOUNTER — Encounter: Payer: Self-pay | Admitting: Medical

## 2018-04-28 VITALS — BP 90/58 | HR 97 | Temp 98.2°F | Resp 16 | Ht 63.0 in | Wt 119.4 lb

## 2018-04-28 DIAGNOSIS — J40 Bronchitis, not specified as acute or chronic: Secondary | ICD-10-CM | POA: Diagnosis not present

## 2018-04-28 DIAGNOSIS — R05 Cough: Secondary | ICD-10-CM

## 2018-04-28 DIAGNOSIS — M940 Chondrocostal junction syndrome [Tietze]: Secondary | ICD-10-CM | POA: Diagnosis not present

## 2018-04-28 DIAGNOSIS — R059 Cough, unspecified: Secondary | ICD-10-CM

## 2018-04-28 MED ORDER — FLUTICASONE PROPIONATE 50 MCG/ACT NA SUSP: 2.0000 | Freq: Every day | NASAL | 1 refills | Status: DC

## 2018-04-28 MED ORDER — DOXYCYCLINE HYCLATE 100 MG PO TABS: 100.0000 mg | ORAL_TABLET | Freq: Two times a day (BID) | ORAL | 0 refills | Status: DC

## 2018-04-28 MED ORDER — BENZONATATE 100 MG PO CAPS: 100.0000 mg | ORAL_CAPSULE | Freq: Three times a day (TID) | ORAL | 0 refills | Status: DC | PRN

## 2018-04-28 NOTE — Patient Instructions (Signed)
You appear to have bronchitis. Rest hydrate and tylenol for fever. I am prescribing cough medicine benzonatate, and doxycycline  antibiotic. For your nasal congestion rx flonase  For costochondritis recommend ibuprofen 200-400 mg every 8 hours for 4-5 days.  You should gradually get better. If not then notify us and would recommend a chest xray.  Follow up in 7-10 days or as needed

## 2018-04-28 NOTE — Progress Notes (Signed)
Subjective:    Patient ID: Jaclyn Bennett, female    DOB: May 11, 1951, 67 y.o.   MRN: [id number]  HPI  Pt in for cough, congestion and runny nose. She started to feel bad on Monday. Recent substituted at school. She has been coughing intermittently with production. 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 Pt felt possible chills and low grade fever.  Pt has been taking tylenol.  Pt has some residual back pain from prior visit. Lower back and got better with ibuprofen. Never to prednisone that was prescribed by other office.  Rt side chest wall pain at time only when she cough. Sharp and transient pain.   Review of Systems  Constitutional: Positive for chills and fever. Negative for fatigue.  HENT: Positive for congestion. Negative for hearing loss, mouth sores, rhinorrhea, sinus pressure and sinus pain.   Respiratory: Positive for cough. Negative for choking and wheezing.        Some productive cough.  Cardiovascular: Negative for chest pain and palpitations.  Gastrointestinal: Negative for abdominal pain.  Musculoskeletal: Negative for back pain, myalgias and neck pain.       Sharp transient chest wall pain/costochandral junction.  Skin: Negative for rash.  Neurological: Negative for dizziness, speech difficulty, weakness and headaches.  Hematological: Negative for adenopathy. Does not bruise/bleed easily.  Psychiatric/Behavioral: Negative for behavioral problems, confusion and dysphoric mood.    Past Medical History:  Diagnosis Date  . Acute asthmatic bronchitis   . Anxiety   . Fibromyalgia   . Herpes simplex type 2 infection 03/30/2016  . Hypercholesterolemia   . Hypothyroidism   . Interstitial cystitis      Social History   Socioeconomic History  . Marital status: Divorced    Spouse name: Not on file  . Number of children: 3  . Years of education: Not on file  . Highest education level: Not on file  Occupational History  . Occupation: Education officer, museum -  substitute teaching now  Social Needs  . Financial resource strain: Not on file  . Food insecurity:    Worry: Not on file    Inability: Not on file  . Transportation needs:    Medical: Not on file    Non-medical: Not on file  Tobacco Use  . Smoking status: Never Smoker  . Smokeless tobacco: Never Used  Substance and Sexual Activity  . Alcohol use: Yes    Comment: Rare  . Drug use: No  . Sexual activity: Not on file  Lifestyle  . Physical activity:    Days per week: Not on file    Minutes per session: Not on file  . Stress: Not on file  Relationships  . Social connections:    Talks on phone: Not on file    Gets together: Not on file    Attends religious service: Not on file    Active member of club or organization: Not on file    Attends meetings of clubs or organizations: Not on file    Relationship status: Not on file  . Intimate partner violence:    Fear of current or ex partner: Not on file    Emotionally abused: Not on file    Physically abused: Not on file    Forced sexual activity: Not on file  Other Topics Concern  . Not on file  Social History Narrative   Plays piano, 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   Declines flu and pneumonia shot 04-30-10    Past Surgical History:  Procedure Laterality Date  . LEFT HEART CATHETERIZATION WITH CORONARY ANGIOGRAM Bilateral 03/13/2014   Procedure: LEFT HEART CATHETERIZATION WITH CORONARY ANGIOGRAM;  Surgeon: Blane Ohara, MD;  Location: [facility] CATH LAB;  Service: Cardiovascular;  Laterality: Bilateral;  . TOTAL ABDOMINAL HYSTERECTOMY  1997    Family History  Problem Relation Age of Onset  . Other Mother        bronchiectasis  . Pneumonia Mother   . Heart failure Mother   . Alzheimer's disease Father   . Stroke Father   . Heart failure Father   . Pancreatic cancer Brother   . Hyperlipidemia Sister   . Other Sister        bronchiectasis    Allergies  Allergen Reactions  . Iodine Anaphylaxis  . Valtrex [Valacyclovir Hcl]  Other (See Comments)    Severe nausea   . Anesthetics, Amide Nausea And Vomiting  . Benadryl Allergy [Diphenhydramine Hcl (Sleep)]     Makes patient hyper  . Sulfonamide Derivatives     REACTION: throat swelling and itching    Current Outpatient Medications on File Prior to Visit  Medication Sig Dispense Refill  . aspirin 81 MG tablet Take 81 mg by mouth daily.      Marland Kitchen atorvastatin (LIPITOR) 20 MG tablet Take 0.5 tablets (10 mg total) by mouth daily. 45 tablet 1  . Cholecalciferol (VITAMIN D) 1000 UNITS capsule Take 1,000 Units by mouth daily.      . cimetidine (TAGAMET) 200 MG tablet Take 0.5 tablets (100 mg total) by mouth daily. 45 tablet 1  . Coconut Oil 1000 MG CAPS Take 1,000 mg by mouth daily.     . Coenzyme Q10 (COQ10) 100 MG CAPS Take 1 capsule by mouth daily.     . cyclobenzaprine (FLEXERIL) 10 MG tablet TAKE ONE (1) TABLET BY MOUTH EACH DAY 30 tablet 5  . estradiol (ESTRACE) 0.5 MG tablet Take 0.25 mg by mouth daily.     . famciclovir (FAMVIR) 250 MG tablet Take 0.5 tablets (125 mg total) by mouth daily. 45 tablet 1  . levothyroxine (SYNTHROID, LEVOTHROID) 75 MCG tablet TAKE ONE (1) TABLET BY MOUTH EACH DAY 90 tablet 1  . lisinopril (PRINIVIL,ZESTRIL) 2.5 MG tablet TAKE ONE (1) TABLET EACH DAY 90 tablet 3  . loratadine (CLARITIN) 10 MG tablet Take 1 tablet (10 mg total) by mouth daily. 30 tablet 11  . metoprolol succinate (TOPROL XL) 25 MG 24 hr tablet Take 1 tablet (25 mg total) by mouth at bedtime. 90 tablet 3  . NON FORMULARY Take 2 tablets by mouth daily. Bone-Up: Calcium Supplement    . vitamin C (ASCORBIC ACID) 500 MG tablet Take 1,000 mg by mouth daily.     . vitamin E 400 UNIT capsule Take 400 Units by mouth daily.      . methylPREDNISolone (MEDROL DOSEPAK) 4 MG TBPK tablet Please take following package directions. (Patient not taking: Reported on 04/28/2018) 21 tablet 0   No current facility-administered medications on file prior to visit.     BP (!) 95/44   Pulse  97   Temp 98.2 F (36.8 C) (Oral)   Resp 16   Ht 5\' 3"  (1.6 m)   Wt 119 lb 6.4 oz (54.2 kg)   SpO2 100%   BMI 21.15 kg/m       Objective:   Physical Exam   General  Mental Status - Alert. General Appearance - Well groomed. Not in acute distress.  Skin Rashes- No Rashes.  HEENT  Head- Normal. Ear Auditory Canal - Left- Normal. Right - Normal.Tympanic Membrane- Left- Normal. Right- Normal. Eye Sclera/Conjunctiva- Left- Normal. Right- Normal. Nose & Sinuses Nasal Mucosa- Left-  Boggy and Congested. Right-  Boggy and  Congested.Bilateral no  maxillary and n o frontal sinus pressure. Mouth & Throat Lips: Upper Lip- Normal: no dryness, cracking, pallor, cyanosis, or vesicular eruption. Lower Lip-Normal: no dryness, cracking, pallor, cyanosis or vesicular eruption. Buccal Mucosa- Bilateral- No Aphthous ulcers. Oropharynx- No Discharge or Erythema. Tonsils: Characteristics- Bilateral- No Erythema or Congestion. Size/Enlargement- Bilateral- No enlargement. Discharge- bilateral-None.  Neck Neck- Supple. No Masses. Trachea midline.   Chest and Lung Exam Auscultation: Breath Sounds:-Clear even and unlabored.  Cardiovascular Auscultation:Rythm- Regular, rate and rhythm. Murmurs & Other Heart Sounds:Ausculatation of the heart reveal- No Murmurs.  Lymphatic Head & Neck General Head & Neck Lymphatics: Bilateral: Description- No Localized lymphadenopathy.  Rt side reproducible costochandral junction pain on palpation and when she coughs.     Assessment & Plan:  You appear to have bronchitis. Rest hydrate and tylenol for fever. I am prescribing cough medicine benzonatate, and doxycycline  antibiotic. For your nasal congestion rx flonase  For costochondritis recommend ibuprofen 200-400 mg every 8 hours for 4-5 days.  You should gradually get better. If not then notify us and would recommend a chest xray.  Follow up in 7-10 days or as needed  [employer], [employer]

## 2018-05-13 ENCOUNTER — Other Ambulatory Visit: Payer: Self-pay | Admitting: Family Medicine

## 2018-05-13 DIAGNOSIS — E039 Hypothyroidism, unspecified: Secondary | ICD-10-CM

## 2018-05-30 ENCOUNTER — Ambulatory Visit (INDEPENDENT_AMBULATORY_CARE_PROVIDER_SITE_OTHER): Payer: Medicare Other | Admitting: Medical

## 2018-05-30 ENCOUNTER — Encounter: Payer: Self-pay | Admitting: Medical

## 2018-05-30 VITALS — BP 108/54 | HR 78 | Temp 98.1°F | Resp 16 | Ht 60.0 in | Wt 121.0 lb

## 2018-05-30 DIAGNOSIS — R059 Cough, unspecified: Secondary | ICD-10-CM

## 2018-05-30 DIAGNOSIS — J40 Bronchitis, not specified as acute or chronic: Secondary | ICD-10-CM

## 2018-05-30 DIAGNOSIS — R05 Cough: Secondary | ICD-10-CM

## 2018-05-30 DIAGNOSIS — J0100 Acute maxillary sinusitis, unspecified: Secondary | ICD-10-CM | POA: Diagnosis not present

## 2018-05-30 DIAGNOSIS — J029 Acute pharyngitis, unspecified: Secondary | ICD-10-CM | POA: Diagnosis not present

## 2018-05-30 MED ORDER — BENZONATATE 100 MG PO CAPS: 100.0000 mg | ORAL_CAPSULE | Freq: Three times a day (TID) | ORAL | 0 refills | Status: DC | PRN

## 2018-05-30 MED ORDER — DOXYCYCLINE HYCLATE 100 MG PO TABS: 100.0000 mg | ORAL_TABLET | Freq: Two times a day (BID) | ORAL | 0 refills | Status: DC

## 2018-05-30 MED ORDER — FLUTICASONE PROPIONATE 50 MCG/ACT NA SUSP: 2.0000 | Freq: Every day | NASAL | 1 refills | Status: DC

## 2018-05-30 NOTE — Progress Notes (Signed)
Pt. Took doxycycline for symptoms, which relieved symptoms, but sx have returned.

## 2018-05-30 NOTE — Patient Instructions (Addendum)
You appear to have sinus infection and recurrent  bronchitis type symptoms. Mild st as well recenty.  For cough can continue benzonatate and for nasal congestion prescribe flonase.  For above infection will rx doxycycline again(this time give rocephin 1 gram im). If you feel not improved by Monday please let me know.  Follow up 7-10 days.

## 2018-05-30 NOTE — Progress Notes (Signed)
Subjective:    Patient ID: Jaclyn Bennett, female    DOB: Jun 03, 1951, 67 y.o.   MRN: [id number]  HPI   Pt in for follow up.  Pt states just recently exposed to grandaughter who was sick. 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  This weekend got st, sinus pressure and got chest congested again.  Pt has flonase and benzonate left over from illness one month ago.   She overall feels like getting same as she was one month ago.  Family coming in this coming week.   Review of Systems  Constitutional: Negative for chills, fatigue and fever.  HENT: Positive for congestion, postnasal drip, sinus pressure, sinus pain and sore throat. Negative for ear pain.   Respiratory: Positive for cough. Negative for chest tightness and wheezing.   Cardiovascular: Negative for chest pain and palpitations.  Gastrointestinal: Negative for abdominal pain.  Musculoskeletal: Negative for back pain.  Neurological: Negative for dizziness, light-headedness and headaches.  Hematological: Negative for adenopathy. Does not bruise/bleed easily.  Psychiatric/Behavioral: Negative for behavioral problems and confusion.    Past Medical History:  Diagnosis Date  . Acute asthmatic bronchitis   . Anxiety   . Fibromyalgia   . Herpes simplex type 2 infection 03/30/2016  . Hypercholesterolemia   . Hypothyroidism   . Interstitial cystitis      Social History   Socioeconomic History  . Marital status: Divorced    Spouse name: Not on file  . Number of children: 3  . Years of education: Not on file  . Highest education level: Not on file  Occupational History  . Occupation: Education officer, museum - substitute teaching now  Social Needs  . Financial resource strain: Not on file  . Food insecurity:    Worry: Not on file    Inability: Not on file  . Transportation needs:    Medical: Not on file    Non-medical: Not on file  Tobacco  Use  . Smoking status: Never Smoker  . Smokeless tobacco: Never Used  Substance and Sexual Activity  . Alcohol use: Yes    Comment: Rare  . Drug use: No  . Sexual activity: Not on file  Lifestyle  . Physical activity:    Days per week: Not on file    Minutes per session: Not on file  . Stress: Not on file  Relationships  . Social connections:    Talks on phone: Not on file    Gets together: Not on file    Attends religious service: Not on file    Active member of club or organization: Not on file    Attends meetings of clubs or organizations: Not on file    Relationship status: Not on file  . Intimate partner violence:    Fear of current or ex partner: Not on file    Emotionally abused: Not on file    Physically abused: Not on file    Forced sexual activity: Not on file  Other Topics Concern  . Not on file  Social History Narrative   Plays piano, 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   Declines flu and pneumonia shot 04-30-10    Past Surgical History:  Procedure Laterality Date  . LEFT HEART CATHETERIZATION WITH CORONARY ANGIOGRAM Bilateral 03/13/2014   Procedure: LEFT HEART CATHETERIZATION WITH CORONARY ANGIOGRAM;  Surgeon: Blane Ohara, MD;  Location: [facility] CATH LAB;  Service: Cardiovascular;  Laterality: Bilateral;  .  TOTAL ABDOMINAL HYSTERECTOMY  1997    Family History  Problem Relation Age of Onset  . Other Mother        bronchiectasis  . Pneumonia Mother   . Heart failure Mother   . Alzheimer's disease Father   . Stroke Father   . Heart failure Father   . Pancreatic cancer Brother   . Hyperlipidemia Sister   . Other Sister        bronchiectasis    Allergies  Allergen Reactions  . Iodine Anaphylaxis  . Valtrex [Valacyclovir Hcl] Other (See Comments)    Severe nausea   . Anesthetics, Amide Nausea And Vomiting  . Benadryl Allergy [Diphenhydramine Hcl (Sleep)]     Makes patient hyper  . Sulfonamide Derivatives     REACTION: throat swelling and itching    Current  Outpatient Medications on File Prior to Visit  Medication Sig Dispense Refill  . aspirin 81 MG tablet Take 81 mg by mouth daily.      Marland Kitchen atorvastatin (LIPITOR) 20 MG tablet Take 0.5 tablets (10 mg total) by mouth daily. 45 tablet 1  . benzonatate (TESSALON) 100 MG capsule Take 1 capsule (100 mg total) by mouth 3 (three) times daily as needed for cough. 30 capsule 0  . Cholecalciferol (VITAMIN D) 1000 UNITS capsule Take 1,000 Units by mouth daily.      . cimetidine (TAGAMET) 200 MG tablet Take 0.5 tablets (100 mg total) by mouth daily. 45 tablet 1  . Coconut Oil 1000 MG CAPS Take 1,000 mg by mouth daily.     . Coenzyme Q10 (COQ10) 100 MG CAPS Take 1 capsule by mouth daily.     . cyclobenzaprine (FLEXERIL) 10 MG tablet TAKE ONE (1) TABLET BY MOUTH EACH DAY 30 tablet 5  . estradiol (ESTRACE) 0.5 MG tablet Take 0.25 mg by mouth daily.     . famciclovir (FAMVIR) 250 MG tablet Take 0.5 tablets (125 mg total) by mouth daily. 45 tablet 1  . fluticasone (FLONASE) 50 MCG/ACT nasal spray Place 2 sprays into both nostrils daily. 16 g 1  . levothyroxine (SYNTHROID, LEVOTHROID) 75 MCG tablet Take 1 tablet (75 mcg total) by mouth daily before breakfast. 90 tablet 0  . lisinopril (PRINIVIL,ZESTRIL) 2.5 MG tablet TAKE ONE (1) TABLET EACH DAY 90 tablet 3  . loratadine (CLARITIN) 10 MG tablet Take 1 tablet (10 mg total) by mouth daily. 30 tablet 11  . methylPREDNISolone (MEDROL DOSEPAK) 4 MG TBPK tablet Please take following package directions. 21 tablet 0  . metoprolol succinate (TOPROL XL) 25 MG 24 hr tablet Take 1 tablet (25 mg total) by mouth at bedtime. 90 tablet 3  . NON FORMULARY Take 2 tablets by mouth daily. Bone-Up: Calcium Supplement    . vitamin C (ASCORBIC ACID) 500 MG tablet Take 1,000 mg by mouth daily.     . vitamin E 400 UNIT capsule Take 400 Units by mouth daily.       No current facility-administered medications on file prior to visit.     BP (!) 108/54 (BP Location: Right Arm, Patient  Position: Sitting, Cuff Size: Normal)   Pulse 78   Temp 98.1 F (36.7 C) (Oral)   Resp 16   Ht 5' (1.524 m)   Wt 121 lb (54.9 kg)   SpO2 98%   BMI 23.63 kg/m       Objective:   Physical Exam  General  Mental Status - Alert. General Appearance - Well groomed. Not in acute distress.  Skin Rashes- No Rashes.  HEENT Head- Normal. Ear Auditory Canal - Left- Normal. Right - Normal.Tympanic Membrane- Left- Normal. Right- Normal. Eye Sclera/Conjunctiva- Left- Normal. Right- Normal. Nose & Sinuses Nasal Mucosa- Left-  Boggy and Congested. Right-  Boggy and  Congested.Bilateral mild  maxillary but no  frontal sinus pressure. Mouth & Throat Lips: Upper Lip- Normal: no dryness, cracking, pallor, cyanosis, or vesicular eruption. Lower Lip-Normal: no dryness, cracking, pallor, cyanosis or vesicular eruption. Buccal Mucosa- Bilateral- No Aphthous ulcers. Oropharynx- No Discharge or Erythema. Tonsils: Characteristics- Bilateral- No Erythema or Congestion. Size/Enlargement- Bilateral- No enlargement. Discharge- bilateral-None.  Neck Neck- Supple. No Masses.   Chest and Lung Exam Auscultation: Breath Sounds:-Clear even and unlabored.  Cardiovascular Auscultation:Rythm- Regular, rate and rhythm. Murmurs & Other Heart Sounds:Ausculatation of the heart reveal- No Murmurs.  Lymphatic Head & Neck General Head & Neck Lymphatics: Bilateral: Description- No Localized lymphadenopathy.      Assessment & Plan:  You appear to have sinus infection and recurrent  bronchitis type symptoms. Mild st as well recenty.  For cough can continue benzonatate and for nasal congestion prescribe flonase.  For above infection will rx doxycycline again(this time give rocephin 1 gram im). If you feel not improved by Monday please let me know.  Follow up 7-10 days.  Mackie Pai, PA-C

## 2018-07-07 ENCOUNTER — Other Ambulatory Visit: Payer: Self-pay | Admitting: Cardiology

## 2018-08-01 ENCOUNTER — Other Ambulatory Visit: Payer: Self-pay | Admitting: Family Medicine

## 2018-08-01 DIAGNOSIS — E039 Hypothyroidism, unspecified: Secondary | ICD-10-CM

## 2018-08-01 DIAGNOSIS — M797 Fibromyalgia: Secondary | ICD-10-CM

## 2018-08-01 MED ORDER — LEVOTHYROXINE SODIUM 75 MCG PO TABS: 75.0000 ug | ORAL_TABLET | Freq: Every day | ORAL | 0 refills | Status: DC

## 2018-08-01 MED ORDER — CYCLOBENZAPRINE HCL 10 MG PO TABS: ORAL_TABLET | ORAL | 0 refills | Status: DC

## 2018-08-01 NOTE — Telephone Encounter (Signed)
Copied from [place] [id number]. Topic: Quick Communication - Rx Refill/Question >> Aug 01, 2018 11:24 AM Windy Kalata wrote: Medication: levothyroxine (SYNTHROID, LEVOTHROID) 75 MCG tablet, cyclobenzaprine (FLEXERIL) 10 MG tablet  Has the patient contacted their pharmacy? No. (Agent: If no, request that the patient contact the pharmacy for the refill.) (Agent: If yes, when and what did the pharmacy advise?)  Preferred Pharmacy (with phone number or street name): [place], [place] - [address] [phone] (Phone) [phone] (Fax)    Agent: Please be advised that RX refills may take up to 3 business days. We ask that you follow-up with your pharmacy.

## 2018-08-01 NOTE — Telephone Encounter (Signed)
Refill request for levothyroxine and cyclobenzaprine; no upcoming visits noted; per Dr Nonda Lou pharmacy note dated 05/15/18, " Requested drug refills are authorized, however, the patient needs further evaluation and/or laboratory testing before further refills are given. Ask her to make an appointment for this"; contacted pt and made her aware; pt offered and accepted appointment with Dr Etter Sjogren, [place], 08/03/2018 at 1015; spoke with [facility] and she verifies that these are fasting labs; the pt verbalizes understanding; will route requests for cyclobenzaprine to office for final disposition.   Requested medication (s) are due for refill today: cyclobenzaprine yes  Requested medication (s) are on the active medication list: yes  Last refill:  09/12/17 by Raiford Noble, PA  Future visit scheduled: yes  Notes to clinic:  Not delegated      Requested Prescriptions  Pending Prescriptions Disp Refills  . levothyroxine (SYNTHROID, LEVOTHROID) 75 MCG tablet 90 tablet 0    Sig: Take 1 tablet (75 mcg total) by mouth daily before breakfast.     Endocrinology:  Hypothyroid Agents Failed - 08/01/2018 11:49 AM      Failed - TSH needs to be rechecked within 3 months after an abnormal result. Refill until TSH is due.      Failed - TSH in normal range and within 360 days    TSH  Date Value Ref Range Status  02/15/2017 2.38 mIU/L Final    Comment:      Reference Range   > or = 20 Years  0.40-4.50   Pregnancy Range First trimester  0.26-2.66 Second trimester 0.55-2.73 Third trimester  0.43-2.91            Passed - Valid encounter within last 12 months    Recent Outpatient Visits          2 months ago Pharyngitis, unspecified etiology   [facility] [place], [place]   3 months ago [facility] [place], PA-C   3 months ago Acute bilateral low back pain without sciatica   [place], [facility], [place]   9 months ago Sore throat   [facility] [place] [facility], DO   9 months ago Flank pain   [facility] [place] [facility], DO           . cyclobenzaprine (FLEXERIL) 10 MG tablet 30 tablet 5    Sig: TAKE ONE (1) TABLET BY MOUTH EACH DAY     Not Delegated - Analgesics:  Muscle Relaxants Failed - 08/01/2018 11:49 AM      Failed - This refill cannot be delegated      Passed - Valid encounter within last 6 months    Recent Outpatient Visits          2 months ago Pharyngitis, unspecified etiology   [facility] [place], [place]   3 months ago [place] [facility] [place], PA-C   3 months ago Acute bilateral low back pain without sciatica   [facility] [place] [facility], [place]   9 months ago Sore throat   [facility] [place] [facility], DO   9 months ago Flank pain   [facility]  [name], DO

## 2018-08-03 ENCOUNTER — Encounter: Payer: Self-pay | Admitting: Family Medicine

## 2018-08-03 ENCOUNTER — Ambulatory Visit (INDEPENDENT_AMBULATORY_CARE_PROVIDER_SITE_OTHER): Payer: Medicare Other | Admitting: Family Medicine

## 2018-08-03 VITALS — BP 102/60 | HR 67 | Temp 98.0°F | Resp 16 | Ht 60.0 in | Wt 122.0 lb

## 2018-08-03 DIAGNOSIS — E785 Hyperlipidemia, unspecified: Secondary | ICD-10-CM

## 2018-08-03 DIAGNOSIS — I10 Essential (primary) hypertension: Secondary | ICD-10-CM

## 2018-08-03 DIAGNOSIS — R5383 Other fatigue: Secondary | ICD-10-CM | POA: Diagnosis not present

## 2018-08-03 DIAGNOSIS — E039 Hypothyroidism, unspecified: Secondary | ICD-10-CM | POA: Diagnosis not present

## 2018-08-03 LAB — T4, FREE: Free T4: 1.11 ng/dL (ref 0.60–1.60)

## 2018-08-03 LAB — LIPID PANEL
CHOL/HDL RATIO: 5
Cholesterol: 185 mg/dL (ref 0–200)
HDL: 40.2 mg/dL (ref >39.00)
NonHDL: 145.06
TRIGLYCERIDES: 248 mg/dL — AB (ref 0.0–149.0)
VLDL: 49.6 mg/dL — ABNORMAL HIGH (ref 0.0–40.0)

## 2018-08-03 LAB — CBC WITH DIFFERENTIAL/PLATELET
Basophils Absolute: 0 10*3/uL (ref 0.0–0.1)
Basophils Relative: 0.5 % (ref 0.0–3.0)
Eosinophils Absolute: 0.2 10*3/uL (ref 0.0–0.7)
Eosinophils Relative: 2.6 % (ref 0.0–5.0)
HCT: 39.7 % (ref 36.0–46.0)
HEMOGLOBIN: 13.2 g/dL (ref 12.0–15.0)
LYMPHS PCT: 32.8 % (ref 12.0–46.0)
Lymphs Abs: 1.9 10*3/uL (ref 0.7–4.0)
MCHC: 33.4 g/dL (ref 30.0–36.0)
MCV: 92.2 fl (ref 78.0–100.0)
Monocytes Absolute: 0.5 10*3/uL (ref 0.1–1.0)
Monocytes Relative: 9.1 % (ref 3.0–12.0)
Neutro Abs: 3.1 10*3/uL (ref 1.4–7.7)
Neutrophils Relative %: 55 % (ref 43.0–77.0)
Platelets: 345 10*3/uL (ref 150.0–400.0)
RBC: 4.3 Mil/uL (ref 3.87–5.11)
RDW: 12.8 % (ref 11.5–15.5)
WBC: 5.7 10*3/uL (ref 4.0–10.5)

## 2018-08-03 LAB — T3, FREE: T3, Free: 3.3 pg/mL (ref 2.3–4.2)

## 2018-08-03 LAB — COMPREHENSIVE METABOLIC PANEL
ALT: 20 U/L (ref 0–35)
AST: 19 U/L (ref 0–37)
Albumin: 4.2 g/dL (ref 3.5–5.2)
Alkaline Phosphatase: 81 U/L (ref 39–117)
BUN: 17 mg/dL (ref 6–23)
CALCIUM: 9.5 mg/dL (ref 8.4–10.5)
CO2: 32 mEq/L (ref 19–32)
Chloride: 101 mEq/L (ref 96–112)
Creatinine, Ser: 0.86 mg/dL (ref 0.40–1.20)
GFR: 65.61 mL/min (ref >60.00)
Glucose, Bld: 82 mg/dL (ref 70–99)
Potassium: 4.9 mEq/L (ref 3.5–5.1)
Sodium: 140 mEq/L (ref 135–145)
Total Bilirubin: 0.4 mg/dL (ref 0.2–1.2)
Total Protein: 6.8 g/dL (ref 6.0–8.3)

## 2018-08-03 LAB — LDL CHOLESTEROL, DIRECT: Direct LDL: 114 mg/dL

## 2018-08-03 LAB — TSH: TSH: 1.76 u[IU]/mL (ref 0.35–4.50)

## 2018-08-03 NOTE — Assessment & Plan Note (Signed)
Check labs con't meds 

## 2018-08-03 NOTE — Assessment & Plan Note (Signed)
Check labs  May be from low bp-- she will d/w cardiology

## 2018-08-03 NOTE — Progress Notes (Signed)
Patient ID: Jaclyn Bennett, female    DOB: 10/10/1950  Age: 68 y.o. MRN: [id number]    Subjective:  Subjective  HPI Jaclyn Bennett presents for f/u chol and bp.  She has been very tired and her bp has been running low.  She will discuss heart meds with cardiology No other complaints.     Review of Systems  Constitutional: Positive for fatigue. Negative for appetite change, diaphoresis and unexpected weight change.  Eyes: Negative for pain, redness and visual disturbance.  Respiratory: Negative for cough, chest tightness, shortness of breath and wheezing.   Cardiovascular: Negative for chest pain, palpitations and leg swelling.  Endocrine: Negative for cold intolerance, heat intolerance, polydipsia, polyphagia and polyuria.  Genitourinary: Negative for difficulty urinating, dysuria and frequency.  Neurological: Negative for dizziness, light-headedness, numbness and headaches.    History Past Medical History:  Diagnosis Date  . Acute asthmatic bronchitis   . Anxiety   . Fibromyalgia   . Herpes simplex type 2 infection 03/30/2016  . Hypercholesterolemia   . Hypothyroidism   . Interstitial cystitis     She has a past surgical history that includes Total abdominal hysterectomy (1997) and left heart catheterization with coronary angiogram (Bilateral, 03/13/2014).   Her family history includes Alzheimer's disease in her father; Heart failure in her father and mother; Hyperlipidemia in her sister; Other in her mother and sister; Pancreatic cancer in her brother; Pneumonia in her mother; Stroke in her father.She reports that she has never smoked. She has never used smokeless tobacco. She reports current alcohol use. She reports that she does not use drugs.  Current Outpatient Medications on File Prior to Visit  Medication Sig Dispense Refill  . aspirin 81 MG tablet Take 81 mg by mouth daily.      Marland Kitchen atorvastatin (LIPITOR) 20 MG tablet Take 0.5 tablets (10 mg total) by mouth daily. 45  tablet 1  . benzonatate (TESSALON) 100 MG capsule Take 1 capsule (100 mg total) by mouth 3 (three) times daily as needed for cough. 30 capsule 0  . Cholecalciferol (VITAMIN D) 1000 UNITS capsule Take 1,000 Units by mouth daily.      . cimetidine (TAGAMET) 200 MG tablet Take 0.5 tablets (100 mg total) by mouth daily. 45 tablet 1  . Coconut Oil 1000 MG CAPS Take 1,000 mg by mouth daily.     . Coenzyme Q10 (COQ10) 100 MG CAPS Take 1 capsule by mouth daily.     . cyclobenzaprine (FLEXERIL) 10 MG tablet TAKE ONE (1) TABLET BY MOUTH EACH DAY 30 tablet 0  . estradiol (ESTRACE) 0.5 MG tablet Take 0.25 mg by mouth daily.     . famciclovir (FAMVIR) 250 MG tablet Take 0.5 tablets (125 mg total) by mouth daily. 45 tablet 1  . levothyroxine (SYNTHROID, LEVOTHROID) 75 MCG tablet Take 1 tablet (75 mcg total) by mouth daily before breakfast. 30 tablet 0  . lisinopril (PRINIVIL,ZESTRIL) 2.5 MG tablet TAKE ONE (1) TABLET EACH DAY 90 tablet 3  . loratadine (CLARITIN) 10 MG tablet Take 1 tablet (10 mg total) by mouth daily. 30 tablet 11  . metoprolol succinate (TOPROL-XL) 25 MG 24 hr tablet TAKE ONE TABLET BY MOUTH EVERY NIGHT AT BEDTIME 90 tablet 1  . NON FORMULARY Take 2 tablets by mouth daily. Bone-Up: Calcium Supplement    . vitamin C (ASCORBIC ACID) 500 MG tablet Take 1,000 mg by mouth daily.     . vitamin E 400 UNIT capsule Take 400 Units by mouth daily.  No current facility-administered medications on file prior to visit.      Objective:  Objective  Physical Exam Vitals signs and nursing note reviewed.  Constitutional:      Appearance: She is well-developed.  HENT:     Head: Normocephalic and atraumatic.  Eyes:     Conjunctiva/sclera: Conjunctivae normal.  Neck:     Musculoskeletal: Normal range of motion and neck supple.     Thyroid: No thyromegaly.     Vascular: No carotid bruit or JVD.  Cardiovascular:     Rate and Rhythm: Normal rate and regular rhythm.     Heart sounds: Normal heart  sounds. No murmur.  Pulmonary:     Effort: Pulmonary effort is normal. No respiratory distress.     Breath sounds: Normal breath sounds. No wheezing or rales.  Chest:     Chest wall: No tenderness.  Neurological:     Mental Status: She is alert and oriented to person, place, and time.    BP 102/60 (BP Location: Left Arm, Cuff Size: Normal)   Pulse 67   Temp 98 F (36.7 C) (Oral)   Resp 16   Ht 5' (1.524 m)   Wt 122 lb (55.3 kg)   SpO2 98%   BMI 23.83 kg/m  Wt Readings from Last 3 Encounters:  08/03/18 122 lb (55.3 kg)  05/30/18 121 lb (54.9 kg)  04/28/18 119 lb 6.4 oz (54.2 kg)     Lab Results  Component Value Date   WBC 8.9 10/20/2017   HGB 13.3 10/20/2017   HCT 38.3 10/20/2017   PLT 325 10/20/2017   GLUCOSE 98 11/14/2017   CHOL 177 11/14/2017   TRIG 156.0 (H) 11/14/2017   HDL 41.80 11/14/2017   LDLDIRECT 92.0 08/09/2016   LDLCALC 104 (H) 11/14/2017   ALT 22 11/14/2017   AST 19 11/14/2017   NA 142 11/14/2017   K 4.3 11/14/2017   CL 104 11/14/2017   CREATININE 0.79 11/14/2017   BUN 16 11/14/2017   CO2 31 11/14/2017   TSH 2.38 02/15/2017   INR 1.03 03/04/2014   HGBA1C 5.8 08/12/2016    No results found.   Assessment & Plan:  Plan  I have discontinued Lemmie Evens. Ladd's methylPREDNISolone, fluticasone, and doxycycline. I am also having her maintain her aspirin, CoQ10, vitamin C, Vitamin D, vitamin E, NON FORMULARY, Coconut Oil, estradiol, loratadine, atorvastatin, lisinopril, cimetidine, famciclovir, benzonatate, metoprolol succinate, levothyroxine, and cyclobenzaprine.  No orders of the defined types were placed in this encounter.   Problem List Items Addressed This Visit      Unprioritized   Fatigue    Check labs  May be from low bp-- she will d/w cardiology      Relevant Orders   CBC with Differential/Platelet   Hyperlipidemia LDL goal <100 - Primary    Tolerating statin, encouraged heart healthy diet, avoid trans fats, minimize simple carbs  and saturated fats. Increase exercise as tolerated      Relevant Orders   Lipid panel   Comprehensive metabolic panel   Hypothyroidism    Check labs con't meds      Relevant Orders   TSH   T3, free   T4, free    Other Visit Diagnoses    Essential hypertension       Relevant Orders   Lipid panel   Comprehensive metabolic panel      Follow-up: Return in about 6 months (around 02/01/2019), or if symptoms worsen or fail to improve, for annual exam,  fasting.  [name], DO

## 2018-08-03 NOTE — Assessment & Plan Note (Signed)
Tolerating statin, encouraged heart healthy diet, avoid trans fats, minimize simple carbs and saturated fats. Increase exercise as tolerated 

## 2018-08-03 NOTE — Patient Instructions (Signed)
DASH Eating Plan  DASH stands for "Dietary Approaches to Stop Hypertension." The DASH eating plan is a healthy eating plan that has been shown to reduce high blood pressure (hypertension). It may also reduce your risk for type 2 diabetes, heart disease, and stroke. The DASH eating plan may also help with weight loss.  What are tips for following this plan?    General guidelines   Avoid eating more than 2,300 mg (milligrams) of salt (sodium) a day. If you have hypertension, you may need to reduce your sodium intake to 1,500 mg a day.   Limit alcohol intake to no more than 1 drink a day for nonpregnant women and 2 drinks a day for men. One drink equals 12 oz of beer, 5 oz of wine, or 1 oz of hard liquor.   Work with your health care provider to maintain a healthy body weight or to lose weight. Ask what an ideal weight is for you.   Get at least 30 minutes of exercise that causes your heart to beat faster (aerobic exercise) most days of the week. Activities may include walking, swimming, or biking.   Work with your health care provider or diet and nutrition specialist (dietitian) to adjust your eating plan to your individual calorie needs.  Reading food labels     Check food labels for the amount of sodium per serving. Choose foods with less than 5 percent of the Daily Value of sodium. Generally, foods with less than 300 mg of sodium per serving fit into this eating plan.   To find whole grains, look for the word "whole" as the first word in the ingredient list.  Shopping   Buy products labeled as "low-sodium" or "no salt added."   Buy fresh foods. Avoid canned foods and premade or frozen meals.  Cooking   Avoid adding salt when cooking. Use salt-free seasonings or herbs instead of table salt or sea salt. Check with your health care provider or pharmacist before using salt substitutes.   Do not fry foods. Cook foods using healthy methods such as baking, boiling, grilling, and broiling instead.   Cook with  heart-healthy oils, such as olive, canola, soybean, or sunflower oil.  Meal planning   Eat a balanced diet that includes:  ? 5 or more servings of fruits and vegetables each day. At each meal, try to fill half of your plate with fruits and vegetables.  ? Up to 6-8 servings of whole grains each day.  ? Less than 6 oz of lean meat, poultry, or fish each day. A 3-oz serving of meat is about the same size as a deck of cards. One egg equals 1 oz.  ? 2 servings of low-fat dairy each day.  ? A serving of nuts, seeds, or beans 5 times each week.  ? Heart-healthy fats. Healthy fats called Omega-3 fatty acids are found in foods such as flaxseeds and coldwater fish, like sardines, salmon, and mackerel.   Limit how much you eat of the following:  ? Canned or prepackaged foods.  ? Food that is high in trans fat, such as fried foods.  ? Food that is high in saturated fat, such as fatty meat.  ? Sweets, desserts, sugary drinks, and other foods with added sugar.  ? Full-fat dairy products.   Do not salt foods before eating.   Try to eat at least 2 vegetarian meals each week.   Eat more home-cooked food and less restaurant, buffet, and fast food.     When eating at a restaurant, ask that your food be prepared with less salt or no salt, if possible.  What foods are recommended?  The items listed may not be a complete list. Talk with your dietitian about what dietary choices are best for you.  Grains  Whole-grain or whole-wheat bread. Whole-grain or whole-wheat pasta. Brown rice. Oatmeal. Quinoa. Bulgur. Whole-grain and low-sodium cereals. Pita bread. Low-fat, low-sodium crackers. Whole-wheat flour tortillas.  Vegetables  Fresh or frozen vegetables (raw, steamed, roasted, or grilled). Low-sodium or reduced-sodium tomato and vegetable juice. Low-sodium or reduced-sodium tomato sauce and tomato paste. Low-sodium or reduced-sodium canned vegetables.  Fruits  All fresh, dried, or frozen fruit. Canned fruit in natural juice (without  added sugar).  Meat and other protein foods  Skinless chicken or turkey. Ground chicken or turkey. Pork with fat trimmed off. Fish and seafood. Egg whites. Dried beans, peas, or lentils. Unsalted nuts, nut butters, and seeds. Unsalted canned beans. Lean cuts of beef with fat trimmed off. Low-sodium, lean deli meat.  Dairy  Low-fat (1%) or fat-free (skim) milk. Fat-free, low-fat, or reduced-fat cheeses. Nonfat, low-sodium ricotta or cottage cheese. Low-fat or nonfat yogurt. Low-fat, low-sodium cheese.  Fats and oils  Soft margarine without trans fats. Vegetable oil. Low-fat, reduced-fat, or light mayonnaise and salad dressings (reduced-sodium). Canola, safflower, olive, soybean, and sunflower oils. Avocado.  Seasoning and other foods  Herbs. Spices. Seasoning mixes without salt. Unsalted popcorn and pretzels. Fat-free sweets.  What foods are not recommended?  The items listed may not be a complete list. Talk with your dietitian about what dietary choices are best for you.  Grains  Baked goods made with fat, such as croissants, muffins, or some breads. Dry pasta or rice meal packs.  Vegetables  Creamed or fried vegetables. Vegetables in a cheese sauce. Regular canned vegetables (not low-sodium or reduced-sodium). Regular canned tomato sauce and paste (not low-sodium or reduced-sodium). Regular tomato and vegetable juice (not low-sodium or reduced-sodium). Pickles. Olives.  Fruits  Canned fruit in a light or heavy syrup. Fried fruit. Fruit in cream or butter sauce.  Meat and other protein foods  Fatty cuts of meat. Ribs. Fried meat. Bacon. Sausage. Bologna and other processed lunch meats. Salami. Fatback. Hotdogs. Bratwurst. Salted nuts and seeds. Canned beans with added salt. Canned or smoked fish. Whole eggs or egg yolks. Chicken or turkey with skin.  Dairy  Whole or 2% milk, cream, and half-and-half. Whole or full-fat cream cheese. Whole-fat or sweetened yogurt. Full-fat cheese. Nondairy creamers. Whipped toppings.  Processed cheese and cheese spreads.  Fats and oils  Butter. Stick margarine. Lard. Shortening. Ghee. Bacon fat. Tropical oils, such as coconut, palm kernel, or palm oil.  Seasoning and other foods  Salted popcorn and pretzels. Onion salt, garlic salt, seasoned salt, table salt, and sea salt. Worcestershire sauce. Tartar sauce. Barbecue sauce. Teriyaki sauce. Soy sauce, including reduced-sodium. Steak sauce. Canned and packaged gravies. Fish sauce. Oyster sauce. Cocktail sauce. Horseradish that you find on the shelf. Ketchup. Mustard. Meat flavorings and tenderizers. Bouillon cubes. Hot sauce and Tabasco sauce. Premade or packaged marinades. Premade or packaged taco seasonings. Relishes. Regular salad dressings.  Where to find more information:   National Heart, Lung, and Blood Institute: www.nhlbi.nih.gov   American Heart Association: www.heart.org  Summary   The DASH eating plan is a healthy eating plan that has been shown to reduce high blood pressure (hypertension). It may also reduce your risk for type 2 diabetes, heart disease, and stroke.   With the   DASH eating plan, you should limit salt (sodium) intake to 2,300 mg a day. If you have hypertension, you may need to reduce your sodium intake to 1,500 mg a day.   When on the DASH eating plan, aim to eat more fresh fruits and vegetables, whole grains, lean proteins, low-fat dairy, and heart-healthy fats.   Work with your health care provider or diet and nutrition specialist (dietitian) to adjust your eating plan to your individual calorie needs.  This information is not intended to replace advice given to you by your health care provider. Make sure you discuss any questions you have with your health care provider.  Document Released: 05/20/2011 Document Revised: 05/24/2016 Document Reviewed: 05/24/2016  Elsevier Interactive Patient Education  2019 [employer]vier Inc.

## 2018-08-23 ENCOUNTER — Other Ambulatory Visit: Payer: Self-pay | Admitting: Obstetrics and Gynecology

## 2018-08-29 ENCOUNTER — Other Ambulatory Visit: Payer: Self-pay | Admitting: Obstetrics and Gynecology

## 2018-08-29 DIAGNOSIS — Z1231 Encounter for screening mammogram for malignant neoplasm of breast: Secondary | ICD-10-CM

## 2018-08-29 DIAGNOSIS — E2839 Other primary ovarian failure: Secondary | ICD-10-CM

## 2018-09-07 ENCOUNTER — Other Ambulatory Visit: Payer: Self-pay | Admitting: Family Medicine

## 2018-09-07 DIAGNOSIS — M797 Fibromyalgia: Secondary | ICD-10-CM

## 2018-09-07 DIAGNOSIS — E039 Hypothyroidism, unspecified: Secondary | ICD-10-CM

## 2018-11-07 ENCOUNTER — Other Ambulatory Visit: Payer: Self-pay | Admitting: Family Medicine

## 2018-11-07 DIAGNOSIS — M797 Fibromyalgia: Secondary | ICD-10-CM

## 2018-11-13 ENCOUNTER — Telehealth: Payer: Self-pay | Admitting: Cardiology

## 2018-11-13 NOTE — Telephone Encounter (Signed)
Spoke with pt, she has cut the metoprolol in 1/2 since Friday, since the decrease she has not had as much trouble getting out bed. She does not really see a difference in the bp. Will forward for dr Stanford Breed review for any other medication adjustments.

## 2018-11-13 NOTE — Telephone Encounter (Signed)
  Pt c/o BP issue: STAT if pt c/o blurred vision, one-sided weakness or slurred speech  1. What are your last 5 BP readings?  5/28  105/56   Pm 102/55 5/29  102/55 5/30  101/60 5/31    96/53 6/1     117/60   2. Are you having any other symptoms (ex. Dizziness, headache, blurred vision, passed out)? No energy and dizziness  3. What is your BP issue? Patient has started to cut her metoprolol in half the past 3 nights because she feels her blood pressure is too low. She feels like she has no energy and can barely get around. If calling her back after 2pm please call cell number.

## 2018-11-13 NOTE — Telephone Encounter (Signed)
Would continue toprol 12.5 mg daily if possible; if symptoms persist, can [place]

## 2018-11-13 NOTE — Telephone Encounter (Signed)
Spoke with pt, Aware of dr crenshaw's recommendations.  °

## 2018-11-14 ENCOUNTER — Other Ambulatory Visit: Payer: Self-pay

## 2018-11-14 ENCOUNTER — Ambulatory Visit
Admission: RE | Admit: 2018-11-14 | Discharge: 2018-11-14 | Disposition: A | Discharge status: Home / Self Care | Payer: Medicare Other | Source: Ambulatory Visit | Attending: Obstetrics and Gynecology | Admitting: Obstetrics and Gynecology

## 2018-11-14 DIAGNOSIS — Z1231 Encounter for screening mammogram for malignant neoplasm of breast: Secondary | ICD-10-CM

## 2018-11-14 DIAGNOSIS — E2839 Other primary ovarian failure: Secondary | ICD-10-CM

## 2018-12-08 ENCOUNTER — Telehealth: Payer: Self-pay | Admitting: Family Medicine

## 2018-12-08 DIAGNOSIS — M797 Fibromyalgia: Secondary | ICD-10-CM

## 2018-12-08 DIAGNOSIS — E785 Hyperlipidemia, unspecified: Secondary | ICD-10-CM

## 2018-12-11 NOTE — Telephone Encounter (Signed)
Pt called stating she only has one pill and she will be taking it tonight. Pt states she will be home until 2:15. Pt states after then to please call her cell phone. Please advise.

## 2018-12-12 NOTE — Telephone Encounter (Signed)
Pt following up on refill request for these meds.  Pt states she does not have any left and needs today. Pt aware it may take 3 business days for refills.  [facility] - [place], [place] - [address] [phone] (Phone) [phone] (Fax)   Pt would like to know if dr Etter Sjogren will consider put refills so she does not have to call every month.

## 2018-12-13 NOTE — Telephone Encounter (Signed)
Medication sent on yesterday

## 2018-12-25 ENCOUNTER — Other Ambulatory Visit: Payer: Self-pay | Admitting: Family Medicine

## 2019-01-02 ENCOUNTER — Other Ambulatory Visit: Payer: Self-pay

## 2019-01-02 ENCOUNTER — Ambulatory Visit (INDEPENDENT_AMBULATORY_CARE_PROVIDER_SITE_OTHER): Payer: Medicare Other | Admitting: Family Medicine

## 2019-01-02 ENCOUNTER — Encounter: Payer: Self-pay | Admitting: Family Medicine

## 2019-01-02 DIAGNOSIS — Z20822 Contact with and (suspected) exposure to covid-19: Secondary | ICD-10-CM

## 2019-01-02 DIAGNOSIS — Z20828 Contact with and (suspected) exposure to other viral communicable diseases: Secondary | ICD-10-CM | POA: Diagnosis not present

## 2019-01-02 NOTE — Progress Notes (Signed)
Virtual Visit via Telephone Note  I connected with Jaclyn Bennett on 01/02/19 at 11:15 AM EDT by telephone and verified that I am speaking with the correct person using two identifiers.  Location: Patient: home  Provider: office    I discussed the limitations, risks, security and privacy concerns of performing an evaluation and management service by telephone and the availability of in person appointments. I also discussed with the patient that there may be a patient responsible charge related to this service. The patient expressed understanding and agreed to proceed.   History of Present Illness: Pt is home and c/o possible covid exposure.  She got her hair done wed and sat she found out the hair dresser was exposed to a pt with covid and the hair dresser was tested but her test is not back yet.    Observations/Objective: Pt is afebrile  No other vitals obtained   Assessment and Plan: 1. Exposure to Covid-19 Virus Pt would like to wait to be tested until her hair dresser gets her test back She is staying home in the meantime  - Novel Coronavirus, NAA ([facility])   Follow Up Instructions:    I discussed the assessment and treatment plan with the patient. The patient was provided an opportunity to ask questions and all were answered. The patient agreed with the plan and demonstrated an understanding of the instructions.   The patient was advised to call back or seek an in-person evaluation if the symptoms worsen or if the condition fails to improve as anticipated.  I provided 15 minutes of non-face-to-face time during this encounter.   [name], DO

## 2019-01-04 ENCOUNTER — Encounter: Payer: Self-pay | Admitting: Family Medicine

## 2019-01-08 ENCOUNTER — Other Ambulatory Visit: Payer: Self-pay | Admitting: Family Medicine

## 2019-01-08 DIAGNOSIS — M797 Fibromyalgia: Secondary | ICD-10-CM

## 2019-01-12 ENCOUNTER — Telehealth: Payer: Self-pay | Admitting: Cardiology

## 2019-01-12 NOTE — Telephone Encounter (Signed)
New message   Patient states that she feels that she has a weight on her chest. Patient states that she does not have any other symptoms. Please call.

## 2019-01-12 NOTE — Telephone Encounter (Signed)
LMTCB

## 2019-01-17 NOTE — Telephone Encounter (Signed)
TRIED TO CB-WENT RIGHT TO VM MAIL BOX IS FULL UNABLE TO CONTACT PT

## 2019-02-15 ENCOUNTER — Other Ambulatory Visit: Payer: Self-pay | Admitting: Cardiology

## 2019-02-15 NOTE — Telephone Encounter (Signed)
°*  STAT* If patient is at the pharmacy, call can be transferred to refill team.   1. Which medications need to be refilled? (please list name of each medication and dose if known)  Metoprolol   2. Which pharmacy/location (including street and city if local pharmacy) is medication to be sent to? [facility] [phone]  3. Do they need a 30 day or 90 day supply? 90 and refills

## 2019-02-16 MED ORDER — METOPROLOL SUCCINATE ER 25 MG PO TB24: 25.0000 mg | ORAL_TABLET | Freq: Every day | ORAL | 0 refills | Status: DC

## 2019-02-16 NOTE — Telephone Encounter (Signed)
Requested Prescriptions   Signed Prescriptions Disp Refills  . metoprolol succinate (TOPROL-XL) 25 MG 24 hr tablet 90 tablet 0    Sig: Take 1 tablet (25 mg total) by mouth at bedtime. PLEASE CLL FOR APPOINTMENT FOR FURTHER REFILLS.    Authorizing Provider: Lelon Perla    Ordering User: Raelene Bott, BRANDY L

## 2019-03-07 ENCOUNTER — Other Ambulatory Visit: Payer: Self-pay | Admitting: Family Medicine

## 2019-03-07 DIAGNOSIS — E039 Hypothyroidism, unspecified: Secondary | ICD-10-CM

## 2019-03-07 DIAGNOSIS — M797 Fibromyalgia: Secondary | ICD-10-CM

## 2019-03-14 ENCOUNTER — Telehealth: Payer: Self-pay | Admitting: Cardiology

## 2019-03-14 MED ORDER — LISINOPRIL 2.5 MG PO TABS: ORAL_TABLET | ORAL | 3 refills | Status: DC

## 2019-03-14 NOTE — Telephone Encounter (Signed)
°*  STAT* If patient is at the pharmacy, call can be transferred to refill team.   1. Which medications need to be refilled? (please list name of each medication and dose if known) lisinopril 2.5mg  tablet  2. Which pharmacy/location (including street and city if local pharmacy) is medication to be sent to?[facility]  3. Do they need a 30 day or 90 day supply? [place]

## 2019-03-14 NOTE — Telephone Encounter (Signed)
Refill sent to the pharmacy electronically.  

## 2019-04-24 NOTE — Progress Notes (Signed)
HPI: FU cardiomyopathy; previously seen for atypical CP; stress echo 9/15 showed baseline EF 45; inferior, septal and apical HK both at rest and with stress. Cardiac cath 9/15 showed normal coronary arteries. Last echo 10/19 showed ejection fraction 50 to [id number], grade 1 diastolic dysfunction and mild mitral regurgitation.  Since last seen,there is no chest pain.  Occasional mild dyspnea on exertion.  She does complain of fatigue.  Current Outpatient Medications  Medication Sig Dispense Refill  . aspirin 81 MG tablet Take 81 mg by mouth daily.      Marland Kitchen atorvastatin (LIPITOR) 20 MG tablet TAKE 1/2 TABLET BY MOUTH DAILY 45 tablet 1  . benzonatate (TESSALON) 100 MG capsule Take 1 capsule (100 mg total) by mouth 3 (three) times daily as needed for cough. 30 capsule 0  . Cholecalciferol (VITAMIN D) 1000 UNITS capsule Take 1,000 Units by mouth daily.      . cimetidine (TAGAMET) 200 MG tablet Take 0.5 tablets (100 mg total) by mouth daily. 45 tablet 1  . Coconut Oil 1000 MG CAPS Take 1,000 mg by mouth daily.     . Coenzyme Q10 (COQ10) 100 MG CAPS Take 1 capsule by mouth daily.     . cyclobenzaprine (FLEXERIL) 10 MG tablet TAKE 1 TABLET BY MOUTH DAILY 30 tablet 2  . estradiol (ESTRACE) 0.5 MG tablet Take 0.25 mg by mouth every other day.     . famciclovir (FAMVIR) 250 MG tablet TAKE 1/2 TABLET BY MOUTH DAILY 45 tablet 1  . levothyroxine (SYNTHROID) 75 MCG tablet TAKE ONE TABLET BY MOUTH EVERY MORNING BEFORE BREAKFAST 90 tablet 1  . lisinopril (ZESTRIL) 2.5 MG tablet TAKE ONE (1) TABLET EACH DAY 90 tablet 3  . loratadine (CLARITIN) 10 MG tablet Take 1 tablet (10 mg total) by mouth daily. 30 tablet 11  . metoprolol succinate (TOPROL-XL) 25 MG 24 hr tablet Take 1 tablet (25 mg total) by mouth at bedtime. PLEASE CLL FOR APPOINTMENT FOR FURTHER REFILLS. (Patient taking differently: Take 12.5 mg by mouth at bedtime. PLEASE CLL FOR APPOINTMENT FOR FURTHER REFILLS.) 90 tablet 0  . NON FORMULARY Take 2  tablets by mouth daily. Bone-Up: Calcium Supplement    . vitamin C (ASCORBIC ACID) 500 MG tablet Take 1,000 mg by mouth daily.     . vitamin E 400 UNIT capsule Take 400 Units by mouth daily.       No current facility-administered medications for this visit.      Past Medical History:  Diagnosis Date  . Acute asthmatic bronchitis   . Anxiety   . Fibromyalgia   . Herpes simplex type 2 infection 03/30/2016  . Hypercholesterolemia   . Hypothyroidism   . Interstitial cystitis     Past Surgical History:  Procedure Laterality Date  . LEFT HEART CATHETERIZATION WITH CORONARY ANGIOGRAM Bilateral 03/13/2014   Procedure: LEFT HEART CATHETERIZATION WITH CORONARY ANGIOGRAM;  Surgeon: Blane Ohara, MD;  Location: [facility] CATH LAB;  Service: Cardiovascular;  Laterality: Bilateral;  . TOTAL ABDOMINAL HYSTERECTOMY  1997    Social History   Socioeconomic History  . Marital status: Divorced    Spouse name: Not on file  . Number of children: 3  . Years of education: Not on file  . Highest education level: Not on file  Occupational History  . Occupation: Education officer, museum - substitute teaching now  Social Needs  . Financial resource strain: Not on file  . Food insecurity    Worry: Not on file  Inability: Not on file  . Transportation needs    Medical: Not on file    Non-medical: Not on file  Tobacco Use  . Smoking status: Never Smoker  . Smokeless tobacco: Never Used  Substance and Sexual Activity  . Alcohol use: Yes    Comment: Rare  . Drug use: No  . Sexual activity: Not on file  Lifestyle  . Physical activity    Days per week: Not on file    Minutes per session: Not on file  . Stress: Not on file  Relationships  . Social Herbalist on phone: Not on file    Gets together: Not on file    Attends religious service: Not on file    Active member of club or organization: Not on file    Attends meetings of clubs or organizations: Not on file    Relationship status: Not  on file  . Intimate partner violence    Fear of current or ex partner: Not on file    Emotionally abused: Not on file    Physically abused: Not on file    Forced sexual activity: Not on file  Other Topics Concern  . Not on file  Social History Narrative   Plays piano, 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   Declines flu and pneumonia shot 04-30-10    Family History  Problem Relation Age of Onset  . Other Mother        bronchiectasis  . Pneumonia Mother   . Heart failure Mother   . Alzheimer's disease Father   . Stroke Father   . Heart failure Father   . Pancreatic cancer Brother   . Hyperlipidemia Sister   . Other Sister        bronchiectasis    ROS: no fevers or chills, productive cough, hemoptysis, dysphasia, odynophagia, melena, hematochezia, dysuria, hematuria, rash, seizure activity, orthopnea, PND, pedal edema, claudication. Remaining systems are negative.  Physical Exam: Well-developed well-nourished in no acute distress.  Skin is warm and dry.  HEENT is normal.  Neck is supple.  Chest is clear to auscultation with normal expansion.  Cardiovascular exam is regular rate and rhythm.  Abdominal exam nontender or distended. No masses palpated. Extremities show no edema. neuro grossly intact  ECG-sinus rhythm at a rate of 75, inferolateral ST depression unchanged compared to March 22, 2018 personally reviewed  A/P  1 nonischemic cardiomyopathy-LV function improved on most recent echocardiogram.  Continue ACE inhibitor; she is describing fatigue.  I will discontinue Toprol to see if this improves her symptoms.  2 history of chest pain-symptoms atypical previously and no recurrences.  Catheterization showed no coronary disease.  3 hyperlipidemia-continue statin.  4 fatigue-we will discontinue Toprol to see if her symptoms improve.  Otherwise follow-up with primary care and may need to have TSH and hemoglobin checked.  Kirk Ruths, MD

## 2019-05-02 ENCOUNTER — Other Ambulatory Visit: Payer: Self-pay

## 2019-05-02 ENCOUNTER — Encounter: Payer: Self-pay | Admitting: Cardiology

## 2019-05-02 ENCOUNTER — Ambulatory Visit (INDEPENDENT_AMBULATORY_CARE_PROVIDER_SITE_OTHER): Payer: Medicare Other | Admitting: Cardiology

## 2019-05-02 VITALS — BP 114/57 | HR 77 | Ht 63.0 in | Wt 122.0 lb

## 2019-05-02 DIAGNOSIS — R5383 Other fatigue: Secondary | ICD-10-CM | POA: Diagnosis not present

## 2019-05-02 DIAGNOSIS — E7800 Pure hypercholesterolemia, unspecified: Secondary | ICD-10-CM

## 2019-05-02 DIAGNOSIS — I420 Dilated cardiomyopathy: Secondary | ICD-10-CM | POA: Diagnosis not present

## 2019-05-02 NOTE — Patient Instructions (Signed)
Medication Instructions:  STOP METOPROLOL  *If you need a refill on your cardiac medications before your next appointment, please call your pharmacy*  Lab Work: If you have labs (blood work) drawn today and your tests are completely normal, you will receive your results only by: Marland Kitchen MyChart Message (if you have MyChart) OR . A paper copy in the mail If you have any lab test that is abnormal or we need to change your treatment, we will call you to review the results.  Follow-Up: At [facility], you and your health needs are our priority.  As part of our continuing mission to provide you with exceptional heart care, we have created designated Provider Care Teams.  These Care Teams include your primary Cardiologist (physician) and Advanced Practice Providers (APPs -  Physician Assistants and Nurse Practitioners) who all work together to provide you with the care you need, when you need it.  Your next appointment:   12 month(s)  The format for your next appointment:   In Person  Provider:   Kirk Ruths, MD

## 2019-05-06 ENCOUNTER — Emergency Department (HOSPITAL_BASED_OUTPATIENT_CLINIC_OR_DEPARTMENT_OTHER)
Admission: EM | Admit: 2019-05-06 | Discharge: 2019-05-06 | Disposition: A | Discharge status: Home / Self Care | Payer: Medicare Other | Attending: Emergency Medicine | Admitting: Emergency Medicine

## 2019-05-06 ENCOUNTER — Encounter (HOSPITAL_BASED_OUTPATIENT_CLINIC_OR_DEPARTMENT_OTHER): Payer: Self-pay

## 2019-05-06 ENCOUNTER — Other Ambulatory Visit: Payer: Self-pay

## 2019-05-06 DIAGNOSIS — Z7982 Long term (current) use of aspirin: Secondary | ICD-10-CM | POA: Diagnosis not present

## 2019-05-06 DIAGNOSIS — Y998 Other external cause status: Secondary | ICD-10-CM | POA: Insufficient documentation

## 2019-05-06 DIAGNOSIS — Z79899 Other long term (current) drug therapy: Secondary | ICD-10-CM | POA: Insufficient documentation

## 2019-05-06 DIAGNOSIS — W5503XA Scratched by cat, initial encounter: Secondary | ICD-10-CM | POA: Diagnosis not present

## 2019-05-06 DIAGNOSIS — Y92019 Unspecified place in single-family (private) house as the place of occurrence of the external cause: Secondary | ICD-10-CM | POA: Insufficient documentation

## 2019-05-06 DIAGNOSIS — S6992XA Unspecified injury of left wrist, hand and finger(s), initial encounter: Secondary | ICD-10-CM | POA: Diagnosis present

## 2019-05-06 DIAGNOSIS — Y93K9 Activity, other involving animal care: Secondary | ICD-10-CM | POA: Insufficient documentation

## 2019-05-06 DIAGNOSIS — E039 Hypothyroidism, unspecified: Secondary | ICD-10-CM | POA: Diagnosis not present

## 2019-05-06 DIAGNOSIS — S50812A Abrasion of left forearm, initial encounter: Secondary | ICD-10-CM | POA: Diagnosis not present

## 2019-05-06 DIAGNOSIS — S60512A Abrasion of left hand, initial encounter: Secondary | ICD-10-CM | POA: Diagnosis not present

## 2019-05-06 MED ORDER — AMOXICILLIN-POT CLAVULANATE 875-125 MG PO TABS
1.0000 | ORAL_TABLET | Freq: Once | ORAL | Status: AC
  Administered 2019-05-06: 1 via ORAL
  Filled 2019-05-06: qty 1

## 2019-05-06 MED ORDER — AMOXICILLIN-POT CLAVULANATE 875-125 MG PO TABS: 1.0000 | ORAL_TABLET | Freq: Two times a day (BID) | ORAL | 0 refills | Status: DC

## 2019-05-06 NOTE — Discharge Instructions (Signed)
Return to the ED for signs of infection including redness, pus draining from the area, red streaks, fever.

## 2019-05-06 NOTE — ED Provider Notes (Signed)
[facility] Provider Note   CSN: [id number] Arrival date & time: 05/06/19  2045     History   Chief Complaint Chief Complaint  Patient presents with  . Abrasion    HPI Jaclyn Bennett is a 68 y.o. female with a past medical history of anxiety presents to the ED for cat scratch on left hand and forearm that occurred prior to arrival.  She was trying to pick up her granddaughter's cat when it scratched her.  States that the cat is up-to-date on vaccinations.  Patient states that she is up-to-date on her tetanus vaccine.  Denies any changes to range of motion of arm or finger.     HPI  Past Medical History:  Diagnosis Date  . Acute asthmatic bronchitis   . Anxiety   . Fibromyalgia   . Herpes simplex type 2 infection 03/30/2016  . Hypercholesterolemia   . Hypothyroidism   . Interstitial cystitis     Patient Active Problem List   Diagnosis Date Noted  . Fatigue 08/03/2018  . Hyperlipidemia LDL goal <100 10/31/2017  . Pharyngitis 10/31/2017  . Herpes simplex type 2 infection 03/30/2016  . Congestive dilated cardiomyopathy ([place]) 10/22/2015  . Nonspecific abnormal unspecified cardiovascular function study 02/26/2014  . Screening for ischemic heart disease 12/23/2013  . ALLERGIC RHINITIS 07/07/2009  . LUMP OR MASS IN BREAST 04/05/2008  . Hypothyroidism 08/25/2007  . HYPERCHOLESTEROLEMIA 08/25/2007  . ANXIETY 08/01/2007  . INTERSTITIAL CYSTITIS 08/01/2007  . Fibromyalgia 08/01/2007    Past Surgical History:  Procedure Laterality Date  . LEFT HEART CATHETERIZATION WITH CORONARY ANGIOGRAM Bilateral 03/13/2014   Procedure: LEFT HEART CATHETERIZATION WITH CORONARY ANGIOGRAM;  Surgeon: Blane Ohara, MD;  Location: [facility] CATH LAB;  Service: Cardiovascular;  Laterality: Bilateral;  . TOTAL ABDOMINAL HYSTERECTOMY  1997     OB History   No obstetric history on file.      Home Medications    Prior to Admission medications   Medication  Sig Start Date End Date Taking? Authorizing Provider  amoxicillin-clavulanate (AUGMENTIN) 875-125 MG tablet Take 1 tablet by mouth every 12 (twelve) hours. 05/06/19   Palmyra Rogacki, PA-C  aspirin 81 MG tablet Take 81 mg by mouth daily.      [provider]  atorvastatin (LIPITOR) 20 MG tablet TAKE 1/2 TABLET BY MOUTH DAILY 12/12/18   Carollee Herter, Alferd Apa, DO  benzonatate (TESSALON) 100 MG capsule Take 1 capsule (100 mg total) by mouth 3 (three) times daily as needed for cough. 05/30/18   Saguier, Percell Miller, PA-C  Cholecalciferol (VITAMIN D) 1000 UNITS capsule Take 1,000 Units by mouth daily.      [provider]  cimetidine (TAGAMET) 200 MG tablet Take 0.5 tablets (100 mg total) by mouth daily. 04/24/18   [name], DO  Coconut Oil 1000 MG CAPS Take 1,000 mg by mouth daily.     [provider]  Coenzyme Q10 (COQ10) 100 MG CAPS Take 1 capsule by mouth daily.     [provider]  cyclobenzaprine (FLEXERIL) 10 MG tablet TAKE 1 TABLET BY MOUTH DAILY 03/07/19   Carollee Herter, Alferd Apa, DO  estradiol (ESTRACE) 0.5 MG tablet Take 0.25 mg by mouth every other day.     [provider]  famciclovir (FAMVIR) 250 MG tablet TAKE 1/2 TABLET BY MOUTH DAILY 12/27/18   Carollee Herter, Alferd Apa, DO  levothyroxine (SYNTHROID) 75 MCG tablet TAKE ONE TABLET BY MOUTH EVERY MORNING BEFORE BREAKFAST 03/07/19  Carollee Herter, Yvonne R, DO  lisinopril (ZESTRIL) 2.5 MG tablet TAKE ONE (1) TABLET EACH DAY 03/14/19   Lelon Perla, MD  loratadine (CLARITIN) 10 MG tablet Take 1 tablet (10 mg total) by mouth daily. 10/31/17   [name], DO  NON FORMULARY Take 2 tablets by mouth daily. Bone-Up: Calcium Supplement    [provider]  vitamin C (ASCORBIC ACID) 500 MG tablet Take 1,000 mg by mouth daily.     [provider]  vitamin E 400 UNIT capsule Take 400 Units by mouth daily.      [provider]    Family History Family History  Problem  Relation Age of Onset  . Other Mother        bronchiectasis  . Pneumonia Mother   . Heart failure Mother   . Alzheimer's disease Father   . Stroke Father   . Heart failure Father   . Pancreatic cancer Brother   . Hyperlipidemia Sister   . Other Sister        bronchiectasis    Social History Social History   Tobacco Use  . Smoking status: Never Smoker  . Smokeless tobacco: Never Used  Substance Use Topics  . Alcohol use: Yes    Comment: Rare  . Drug use: No     Allergies   Iodine; Valtrex [valacyclovir hcl]; Anesthetics, amide; Benadryl allergy [diphenhydramine hcl (sleep)]; and Sulfonamide derivatives   Review of Systems Review of Systems  Constitutional: Negative for chills and fever.  Musculoskeletal: Negative for arthralgias and joint swelling.  Skin: Positive for wound.  Neurological: Negative for weakness and numbness.     Physical Exam Updated Vital Signs BP (!) 105/50 (BP Location: Right Arm)   Pulse 83   Temp 98.6 F (37 C) (Oral)   Resp 18   Ht 5\' 3"  (1.6 m)   Wt 55 kg   SpO2 98%   BMI 21.48 kg/m   Physical Exam Vitals signs and nursing note reviewed.  Constitutional:      General: She is not in acute distress.    Appearance: She is well-developed. She is not diaphoretic.  HENT:     Head: Normocephalic and atraumatic.  Eyes:     General: No scleral icterus.    Conjunctiva/sclera: Conjunctivae normal.  Neck:     Musculoskeletal: Normal range of motion.  Pulmonary:     Effort: Pulmonary effort is normal. No respiratory distress.  Skin:    Findings: Wound present. No rash.     Comments: Small puncture wound noted to dorsum of left hand and forearm.  No active bleeding. FROM of digits.  Neurological:     Mental Status: She is alert.      ED Treatments / Results  Labs (all labs ordered are listed, but only abnormal results are displayed) Labs Reviewed - No data to display  EKG None  Radiology No results found.  Procedures  Procedures (including critical care time)  Medications Ordered in ED Medications  amoxicillin-clavulanate (AUGMENTIN) 875-125 MG per tablet 1 tablet (1 tablet Oral Given 05/06/19 2227)     Initial Impression / Assessment and Plan / ED Course  I have reviewed the triage vital signs and the nursing notes.  Pertinent labs & imaging results that were available during my care of the patient were reviewed by me and considered in my medical decision making (see chart for details).        68 year old female presents to ED for puncture wound  from cat scratches on her left hand and forearm.  This occurred prior to arrival.  Patient declining x-rays to evaluate for foreign body.  She has full range of motion of joints distal and proximal to the wound.  No active bleeding.  Discussed risk of wound closure.  Will place on antibiotics after thorough irrigation.  Patient is hemodynamically stable, in NAD, and able to ambulate in the ED. Evaluation does not show pathology that would require ongoing emergent intervention or inpatient treatment. I explained the diagnosis to the patient. Pain has been managed and has no complaints prior to discharge. Patient is comfortable with above plan and is stable for discharge at this time. All questions were answered prior to disposition. Strict return precautions for returning to the ED were discussed. Encouraged follow up with PCP.   An After Visit Summary was printed and given to the patient.   Portions of this note were generated with [employer]. Dictation errors may occur despite best attempts at proofreading.   Final Clinical Impressions(s) / ED Diagnoses   Final diagnoses:  Cat scratch of left forearm, initial encounter  Cat scratch of left hand, initial encounter    ED Discharge Orders         Ordered    amoxicillin-clavulanate (AUGMENTIN) 875-125 MG tablet  Every 12 hours     05/06/19 2217           Delia Heady, PA-C 05/06/19  2235    Lajean Saver, MD 05/07/19 1321

## 2019-05-06 NOTE — ED Triage Notes (Signed)
Pt reports that she was scratched by her cat today, denies being bitten, also reports that the cat is up to date on rabies. Pt has laceration to left hand, and left forearm.

## 2019-05-28 ENCOUNTER — Telehealth: Payer: Self-pay | Admitting: Family Medicine

## 2019-05-28 NOTE — Telephone Encounter (Signed)
Copied from [place] [id number]. Topic: General - Other >> May 28, 2019  3:42 PM Keene Breath wrote: Reason for CRM: Patient called to ask the nurse to call regarding her prescriptions.  Patient might need an appt. But she is not sure.  Please call to discuss at [phone]

## 2019-05-30 ENCOUNTER — Other Ambulatory Visit: Payer: Self-pay

## 2019-05-30 DIAGNOSIS — I10 Essential (primary) hypertension: Secondary | ICD-10-CM

## 2019-05-30 DIAGNOSIS — E785 Hyperlipidemia, unspecified: Secondary | ICD-10-CM

## 2019-05-30 DIAGNOSIS — E039 Hypothyroidism, unspecified: Secondary | ICD-10-CM

## 2019-05-30 MED ORDER — LEVOTHYROXINE SODIUM 75 MCG PO TABS: 75.0000 ug | ORAL_TABLET | Freq: Every day | ORAL | 1 refills | Status: DC

## 2019-05-30 NOTE — Telephone Encounter (Signed)
Spoke with patient. Refill sent on medication. Orders placed for lab appointment in January

## 2019-06-18 ENCOUNTER — Other Ambulatory Visit (INDEPENDENT_AMBULATORY_CARE_PROVIDER_SITE_OTHER): Payer: Medicare HMO

## 2019-06-18 ENCOUNTER — Other Ambulatory Visit: Payer: Self-pay

## 2019-06-18 DIAGNOSIS — I10 Essential (primary) hypertension: Secondary | ICD-10-CM | POA: Diagnosis not present

## 2019-06-18 DIAGNOSIS — E785 Hyperlipidemia, unspecified: Secondary | ICD-10-CM

## 2019-06-18 LAB — LIPID PANEL
Cholesterol: 154 mg/dL (ref 0–200)
HDL: 46.7 mg/dL (ref >39.00)
LDL Cholesterol: 76 mg/dL (ref 0–99)
NonHDL: 107.01
Total CHOL/HDL Ratio: 3
Triglycerides: 154 mg/dL — ABNORMAL HIGH (ref 0.0–149.0)
VLDL: 30.8 mg/dL (ref 0.0–40.0)

## 2019-06-18 LAB — COMPREHENSIVE METABOLIC PANEL
ALT: 30 U/L (ref 0–35)
AST: 27 U/L (ref 0–37)
Albumin: 4.3 g/dL (ref 3.5–5.2)
Alkaline Phosphatase: 96 U/L (ref 39–117)
BUN: 14 mg/dL (ref 6–23)
CO2: 31 mEq/L (ref 19–32)
Calcium: 9.3 mg/dL (ref 8.4–10.5)
Chloride: 102 mEq/L (ref 96–112)
Creatinine, Ser: 0.78 mg/dL (ref 0.40–1.20)
GFR: 73.25 mL/min (ref >60.00)
Glucose, Bld: 97 mg/dL (ref 70–99)
Potassium: 3.9 mEq/L (ref 3.5–5.1)
Sodium: 141 mEq/L (ref 135–145)
Total Bilirubin: 0.4 mg/dL (ref 0.2–1.2)
Total Protein: 6.5 g/dL (ref 6.0–8.3)

## 2019-07-04 ENCOUNTER — Other Ambulatory Visit: Payer: Self-pay | Admitting: Family Medicine

## 2019-07-04 DIAGNOSIS — M797 Fibromyalgia: Secondary | ICD-10-CM

## 2019-08-07 ENCOUNTER — Other Ambulatory Visit: Payer: Self-pay | Admitting: Family Medicine

## 2019-08-07 DIAGNOSIS — E785 Hyperlipidemia, unspecified: Secondary | ICD-10-CM

## 2019-08-08 DIAGNOSIS — N8110 Cystocele, unspecified: Secondary | ICD-10-CM | POA: Diagnosis not present

## 2019-08-08 DIAGNOSIS — Z01419 Encounter for gynecological examination (general) (routine) without abnormal findings: Secondary | ICD-10-CM | POA: Diagnosis not present

## 2019-08-14 DIAGNOSIS — H2513 Age-related nuclear cataract, bilateral: Secondary | ICD-10-CM | POA: Diagnosis not present

## 2019-09-06 ENCOUNTER — Telehealth: Payer: Self-pay | Admitting: Family Medicine

## 2019-09-06 NOTE — Telephone Encounter (Signed)
Noted  

## 2019-09-06 NOTE — Telephone Encounter (Signed)
Per patient Dr Etter Sjogren -Cheri Rous will be receives a fax about one of her prescriptions.

## 2019-09-18 ENCOUNTER — Telehealth: Payer: Self-pay | Admitting: Family Medicine

## 2019-09-18 MED ORDER — FAMCICLOVIR 250 MG PO TABS: 125.0000 mg | ORAL_TABLET | Freq: Every day | ORAL | 0 refills | Status: DC

## 2019-09-18 MED ORDER — FAMCICLOVIR 250 MG PO TABS: 125.0000 mg | ORAL_TABLET | Freq: Every day | ORAL | 1 refills | Status: DC

## 2019-09-18 NOTE — Telephone Encounter (Signed)
Refill sent to both pharmacies

## 2019-09-18 NOTE — Telephone Encounter (Signed)
Medication: famciclovir (FAMVIR) 250 MG tablet [id number]    Has the patient contacted their pharmacy? Yes.   (If no, request that the patient contact the pharmacy for the refill.) (If yes, when and what did the pharmacy advise?)  Preferred Pharmacy (with phone number or street name):  [place], [place] Phone:  [phone]  Fax:  [phone]      Agent: Please be advised that RX refills may take up to 3 business days. We ask that you follow-up with your pharmacy.

## 2019-09-18 NOTE — Telephone Encounter (Signed)
Patient is also requesting a few pills be sent to [facility] b/c patient is completely out of medication.   Please advise  [facility] [place] - [address]  [address], [place] [address]  Phone:  [phone] Fax:  [phone]  DEA #:  --

## 2019-11-07 ENCOUNTER — Other Ambulatory Visit: Payer: Self-pay | Admitting: Obstetrics and Gynecology

## 2019-11-07 DIAGNOSIS — Z1231 Encounter for screening mammogram for malignant neoplasm of breast: Secondary | ICD-10-CM

## 2019-11-19 ENCOUNTER — Other Ambulatory Visit: Payer: Self-pay | Admitting: Family Medicine

## 2019-11-19 DIAGNOSIS — E785 Hyperlipidemia, unspecified: Secondary | ICD-10-CM

## 2019-12-04 ENCOUNTER — Ambulatory Visit
Admission: RE | Admit: 2019-12-04 | Discharge: 2019-12-04 | Disposition: A | Discharge status: Home / Self Care | Payer: Medicare HMO | Source: Ambulatory Visit | Attending: Obstetrics and Gynecology | Admitting: Obstetrics and Gynecology

## 2019-12-04 ENCOUNTER — Other Ambulatory Visit: Payer: Self-pay

## 2019-12-04 DIAGNOSIS — Z1231 Encounter for screening mammogram for malignant neoplasm of breast: Secondary | ICD-10-CM | POA: Diagnosis not present

## 2019-12-10 ENCOUNTER — Other Ambulatory Visit: Payer: Self-pay | Admitting: Cardiology

## 2019-12-28 ENCOUNTER — Other Ambulatory Visit: Payer: Self-pay | Admitting: Family Medicine

## 2019-12-28 DIAGNOSIS — M797 Fibromyalgia: Secondary | ICD-10-CM

## 2020-01-03 ENCOUNTER — Telehealth: Payer: Self-pay | Admitting: Cardiology

## 2020-01-03 NOTE — Telephone Encounter (Signed)
Pt c/o BP issue: STAT if pt c/o blurred vision, one-sided weakness or slurred speech  1. What are your last 5 BP readings?  Sun 1030am 130/69 Sun 1230pm 138/68 Sun 330pm 88/66 [name] 8pm 112/61 Mon 111/61 Wed 116/61 Today 104/61  2. Are you having any other symptoms (ex. Dizziness, headache, blurred vision, passed out)? Dizzy, lightheaded, headache, nausea   3. What is your BP issue? Jaclyn Bennett said for about 3 weeks now, she has been having this dizzy feeling, with a slight headache. She checked with her sister who is a retired Therapist, sports from [employer] and her sister advised her to start checking her BP. Jaclyn Bennett said that even now, while sititng on the couch, she has a lightheaded dizzy feeling and a dull headache. Jaclyn Bennett thought her symptoms could've been related her lisinopril (ZESTRIL) 2.5 MG tablet, maybe they had changed the manufacturer but the pharmacy said it had not changed.

## 2020-01-03 NOTE — Telephone Encounter (Signed)
Spoke with pt who report off and on for the past 3 weeks she been experiencing dizziness, a dull headache, and right arm pain. 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  She also report yesterday she kept feeling nauseated a little SOB during her routine walk.   Most recent BP: Sun 1030am 130/69 Sun 1230pm 138/68 [name] 330pm 88/66 Bearl Mulberry 111/73 Mon 111/61 Wed 116/61 Today 104/61   Will route to MD for recommendations.

## 2020-01-04 NOTE — Telephone Encounter (Signed)
Spoke with pt, Aware of dr crenshaw's recommendations.  °

## 2020-01-04 NOTE — Telephone Encounter (Signed)
Would continue to follow BP Jaclyn Bennett

## 2020-01-31 ENCOUNTER — Other Ambulatory Visit: Payer: Self-pay | Admitting: Family Medicine

## 2020-01-31 DIAGNOSIS — E039 Hypothyroidism, unspecified: Secondary | ICD-10-CM

## 2020-04-01 ENCOUNTER — Other Ambulatory Visit: Payer: Self-pay | Admitting: Family Medicine

## 2020-04-01 DIAGNOSIS — M797 Fibromyalgia: Secondary | ICD-10-CM

## 2020-04-04 ENCOUNTER — Encounter (HOSPITAL_BASED_OUTPATIENT_CLINIC_OR_DEPARTMENT_OTHER): Payer: Self-pay

## 2020-04-04 ENCOUNTER — Other Ambulatory Visit: Payer: Self-pay

## 2020-04-04 ENCOUNTER — Emergency Department (HOSPITAL_BASED_OUTPATIENT_CLINIC_OR_DEPARTMENT_OTHER)
Admission: EM | Admit: 2020-04-04 | Discharge: 2020-04-04 | Disposition: A | Discharge status: Home / Self Care | Payer: Medicare HMO | Attending: Emergency Medicine | Admitting: Emergency Medicine

## 2020-04-04 ENCOUNTER — Emergency Department (HOSPITAL_BASED_OUTPATIENT_CLINIC_OR_DEPARTMENT_OTHER): Payer: Medicare HMO

## 2020-04-04 DIAGNOSIS — M7918 Myalgia, other site: Secondary | ICD-10-CM

## 2020-04-04 DIAGNOSIS — W19XXXA Unspecified fall, initial encounter: Secondary | ICD-10-CM

## 2020-04-04 DIAGNOSIS — Z7982 Long term (current) use of aspirin: Secondary | ICD-10-CM | POA: Diagnosis not present

## 2020-04-04 DIAGNOSIS — Y92410 Unspecified street and highway as the place of occurrence of the external cause: Secondary | ICD-10-CM | POA: Insufficient documentation

## 2020-04-04 DIAGNOSIS — M25562 Pain in left knee: Secondary | ICD-10-CM | POA: Diagnosis not present

## 2020-04-04 DIAGNOSIS — M25531 Pain in right wrist: Secondary | ICD-10-CM | POA: Diagnosis not present

## 2020-04-04 DIAGNOSIS — M25532 Pain in left wrist: Secondary | ICD-10-CM | POA: Diagnosis not present

## 2020-04-04 DIAGNOSIS — E039 Hypothyroidism, unspecified: Secondary | ICD-10-CM | POA: Insufficient documentation

## 2020-04-04 DIAGNOSIS — M79641 Pain in right hand: Secondary | ICD-10-CM | POA: Diagnosis not present

## 2020-04-04 DIAGNOSIS — S6992XA Unspecified injury of left wrist, hand and finger(s), initial encounter: Secondary | ICD-10-CM | POA: Diagnosis not present

## 2020-04-04 DIAGNOSIS — S6991XA Unspecified injury of right wrist, hand and finger(s), initial encounter: Secondary | ICD-10-CM | POA: Diagnosis not present

## 2020-04-04 DIAGNOSIS — Z79899 Other long term (current) drug therapy: Secondary | ICD-10-CM | POA: Insufficient documentation

## 2020-04-04 DIAGNOSIS — M25561 Pain in right knee: Secondary | ICD-10-CM
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
## 2020-04-21 DIAGNOSIS — M79641 Pain in right hand: Secondary | ICD-10-CM | POA: Diagnosis not present

## 2020-04-21 DIAGNOSIS — S8001XA Contusion of right knee, initial encounter: Secondary | ICD-10-CM | POA: Diagnosis not present

## 2020-04-21 DIAGNOSIS — M79642 Pain in left hand: Secondary | ICD-10-CM | POA: Diagnosis not present

## 2020-04-21 DIAGNOSIS — M797 Fibromyalgia: Secondary | ICD-10-CM | POA: Diagnosis not present

## 2020-05-02 NOTE — Progress Notes (Signed)
HPI: FU cardiomyopathy; previously seen for atypical CP; stress echo 9/15 showed baseline EF 45; inferior, septal and apical HK both at rest and with stress. Cardiac cath 9/15 showed normal coronary arteries. Last echo 10/19 showed ejection fraction 50 to 13%, grade 1 diastolic dysfunction and mild mitral regurgitation.  Since last seen,she does have dyspnea with long walks.  No orthopnea, PND, pedal edema, exertional chest pain or syncope.  She does describe fatigue.  Current Outpatient Medications  Medication Sig Dispense Refill  . aspirin 81 MG tablet Take 81 mg by mouth daily.      Marland Kitchen atorvastatin (LIPITOR) 20 MG tablet TAKE 1/2 TABLET BY MOUTH EVERY DAY 45 tablet 0  . Cholecalciferol (VITAMIN D) 1000 UNITS capsule Take 5,000 Units by mouth daily.     . Coconut Oil 1000 MG CAPS Take 1,000 mg by mouth daily.     . Coenzyme Q10 (COQ10) 100 MG CAPS Take 1 capsule by mouth daily.     . COLLAGEN PO Take 1 capsule by mouth daily.    . cyclobenzaprine (FLEXERIL) 10 MG tablet Take 1 tablet (10 mg total) by mouth daily. 90 tablet 0  . estradiol (ESTRACE) 0.5 MG tablet Take 0.25 mg by mouth every other day.     . famciclovir (FAMVIR) 250 MG tablet Take 0.5 tablets (125 mg total) by mouth daily. 45 tablet 0  . levothyroxine (SYNTHROID) 75 MCG tablet TAKE ONE TABLET BY MOUTH EVERY MORNING BEFORE BREAKFAST 90 tablet 2  . lisinopril (ZESTRIL) 2.5 MG tablet TAKE ONE (1) TABLET BY MOUTH EVERY DAY 90 tablet 1  . Multiple Vitamins-Minerals (ZINC PO) Take by mouth daily.    . NON FORMULARY Take 2 tablets by mouth daily. Bone-Up: Calcium Supplement    . vitamin C (ASCORBIC ACID) 500 MG tablet Take 1,000 mg by mouth daily.     . vitamin E 400 UNIT capsule Take 400 Units by mouth daily.       No current facility-administered medications for this visit.     Past Medical History:  Diagnosis Date  . Acute asthmatic bronchitis   . Anxiety   . Fibromyalgia   . Herpes simplex type 2 infection  03/30/2016  . Hypercholesterolemia   . Hypothyroidism   . Interstitial cystitis     Past Surgical History:  Procedure Laterality Date  . LEFT HEART CATHETERIZATION WITH CORONARY ANGIOGRAM Bilateral 03/13/2014   Procedure: LEFT HEART CATHETERIZATION WITH CORONARY ANGIOGRAM;  Surgeon: Blane Ohara, MD;  Location: [facility] CATH LAB;  Service: Cardiovascular;  Laterality: Bilateral;  . TOTAL ABDOMINAL HYSTERECTOMY  1997    Social History   Socioeconomic History  . Marital status: Divorced    Spouse name: Not on file  . Number of children: 3  . Years of education: Not on file  . Highest education level: Not on file  Occupational History  . Occupation: Education officer, museum - substitute teaching now  Tobacco Use  . Smoking status: Never Smoker  . Smokeless tobacco: Never Used  Vaping Use  . Vaping Use: Never used  Substance and Sexual Activity  . Alcohol use: Yes    Comment: Rare  . Drug use: No  . Sexual activity: Not on file  Other Topics Concern  . Not on file  Social History Narrative   Plays piano, 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   Declines flu and pneumonia shot 04-30-10   Social Determinants of Health   Financial Resource Strain:   . Difficulty of Paying  Living Expenses: Not on file  Food Insecurity:   . Worried About Charity fundraiser in the Last Year: Not on file  . Ran Out of Food in the Last Year: Not on file  Transportation Needs:   . Lack of Transportation (Medical): Not on file  . Lack of Transportation (Non-Medical): Not on file  Physical Activity:   . Days of Exercise per Week: Not on file  . Minutes of Exercise per Session: Not on file  Stress:   . Feeling of Stress : Not on file  Social Connections:   . Frequency of Communication with Friends and Family: Not on file  . Frequency of Social Gatherings with Friends and Family: Not on file  . Attends Religious Services: Not on file  . Active Member of Clubs or Organizations: Not on file  . Attends Archivist  Meetings: Not on file  . Marital Status: Not on file  Intimate Partner Violence:   . Fear of Current or Ex-Partner: Not on file  . Emotionally Abused: Not on file  . Physically Abused: Not on file  . Sexually Abused: Not on file    Family History  Problem Relation Age of Onset  . Other Mother        bronchiectasis  . Pneumonia Mother   . Heart failure Mother   . Alzheimer's disease Father   . Stroke Father   . Heart failure Father   . Pancreatic cancer Brother   . Hyperlipidemia Sister   . Other Sister        bronchiectasis    ROS: Fatigue but no fevers or chills, productive cough, hemoptysis, dysphasia, odynophagia, melena, hematochezia, dysuria, hematuria, rash, seizure activity, orthopnea, PND, pedal edema, claudication. Remaining systems are negative.  Physical Exam: Well-developed well-nourished in no acute distress.  Skin is warm and dry.  HEENT is normal.  Neck is supple.  Chest is clear to auscultation with normal expansion.  Cardiovascular exam is regular rate and rhythm.  Abdominal exam nontender or distended. No masses palpated. Extremities show no edema. neuro grossly intact  ECG-sinus rhythm at a rate of 79, cannot rule out septal infarct, marked ST depression and T wave inversion in the inferolateral leads.  No significant change in May 04, 2019.  Personally reviewed  A/P  1 nonischemic cardiomyopathy-LV function has improved on most recent echocardiogram.  Continue ACE inhibitor.  Beta-blocker discontinued previously due to fatigue.  She states she is having more fatigue.  We will repeat echocardiogram to reassess LV function.  2 history of chest pain-previous catheterization revealed no coronary disease and she has had no recurrences.  3 hyperlipidemia-continue statin.  Kirk Ruths, MD

## 2020-05-14 ENCOUNTER — Other Ambulatory Visit: Payer: Self-pay

## 2020-05-14 ENCOUNTER — Ambulatory Visit: Payer: Medicare HMO | Admitting: Cardiology

## 2020-05-14 ENCOUNTER — Encounter: Payer: Self-pay | Admitting: Cardiology

## 2020-05-14 VITALS — BP 100/60 | HR 79 | Ht 63.0 in | Wt 115.0 lb

## 2020-05-14 DIAGNOSIS — E7800 Pure hypercholesterolemia, unspecified: Secondary | ICD-10-CM

## 2020-05-14 DIAGNOSIS — I420 Dilated cardiomyopathy: Secondary | ICD-10-CM

## 2020-05-14 DIAGNOSIS — R5383 Other fatigue: Secondary | ICD-10-CM

## 2020-05-14 NOTE — Patient Instructions (Signed)
  Testing/Procedures:  Your physician has requested that you have an echocardiogram. Echocardiography is a painless test that uses sound waves to create images of your heart. It provides your doctor with information about the size and shape of your heart and how well your heart's chambers and valves are working. This procedure takes approximately one hour. There are no restrictions for this procedure.[facility]    Follow-Up: At [facility], you and your health needs are our priority.  As part of our continuing mission to provide you with exceptional heart care, we have created designated Provider Care Teams.  These Care Teams include your primary Cardiologist (physician) and Advanced Practice Providers (APPs -  Physician Assistants and Nurse Practitioners) who all work together to provide you with the care you need, when you need it.  We recommend signing up for the patient portal called "MyChart".  Sign up information is provided on this After Visit Summary.  MyChart is used to connect with patients for Virtual Visits (Telemedicine).  Patients are able to view lab/test results, encounter notes, upcoming appointments, etc.  Non-urgent messages can be sent to your provider as well.   To learn more about what you can do with MyChart, go to [email or web address].    Your next appointment:   12 month(s)  The format for your next appointment:   In Person  Provider:   Kirk Ruths, MD

## 2020-06-18 ENCOUNTER — Encounter: Payer: Self-pay | Admitting: Family Medicine

## 2020-06-18 ENCOUNTER — Telehealth (INDEPENDENT_AMBULATORY_CARE_PROVIDER_SITE_OTHER): Payer: Medicare HMO | Admitting: Family Medicine

## 2020-06-18 ENCOUNTER — Other Ambulatory Visit: Payer: Self-pay

## 2020-06-18 VITALS — BP 111/69 | HR 98 | Temp 99.6°F | Wt 115.0 lb

## 2020-06-18 DIAGNOSIS — M797 Fibromyalgia: Secondary | ICD-10-CM | POA: Diagnosis not present

## 2020-06-18 DIAGNOSIS — J40 Bronchitis, not specified as acute or chronic: Secondary | ICD-10-CM | POA: Diagnosis not present

## 2020-06-18 DIAGNOSIS — J069 Acute upper respiratory infection, unspecified: Secondary | ICD-10-CM

## 2020-06-18 MED ORDER — FLUTICASONE PROPIONATE 50 MCG/ACT NA SUSP: 2.0000 | Freq: Every day | NASAL | 6 refills | Status: DC

## 2020-06-18 MED ORDER — LORATADINE 10 MG PO TABS: 10.0000 mg | ORAL_TABLET | Freq: Every day | ORAL | 11 refills | Status: DC

## 2020-06-18 MED ORDER — BENZONATATE 100 MG PO CAPS: 200.0000 mg | ORAL_CAPSULE | Freq: Three times a day (TID) | ORAL | 0 refills | Status: DC | PRN

## 2020-06-18 MED ORDER — CYCLOBENZAPRINE HCL 10 MG PO TABS: 10.0000 mg | ORAL_TABLET | Freq: Every day | ORAL | 0 refills | Status: DC

## 2020-06-18 MED ORDER — DOXYCYCLINE HYCLATE 100 MG PO TABS: 100.0000 mg | ORAL_TABLET | Freq: Two times a day (BID) | ORAL | 0 refills | Status: DC

## 2020-06-18 NOTE — Progress Notes (Signed)
.  Virtual Visit via Video Note  I connected with Jaclyn Bennett on 06/18/20 at  9:40 AM EST by a video enabled telemedicine application and verified that I am speaking with the correct person using two identifiers.  Location: Patient: home alone  Provider: home    I discussed the limitations of evaluation and management by telemedicine and the availability of in person appointments. The patient expressed understanding and agreed to proceed.  History of Present Illness: Pt is home c/o cough and chills yesterday   Cough is dry  + nasal congestion  She is taking tylenol for fever  Observations/Objective: 99.6 temp Vitals:   06/18/20 0845  BP: 111/69  Pulse: 98  Temp: 99.6 F (37.6 C)  pt has laryngitis  Pt is in nad       Assessment and Plan:  1. Bronchitis abx and cough med per orders  covid test to be done She has not been vaccinated for covid  Call / f/u if no improvement  - benzonatate (TESSALON PERLES) 100 MG capsule; Take 2 capsules (200 mg total) by mouth 3 (three) times daily as needed for cough.  Dispense: 30 capsule; Refill: 0 - doxycycline (VIBRA-TABS) 100 MG tablet; Take 1 tablet (100 mg total) by mouth 2 (two) times daily.  Dispense: 20 tablet; Refill: 0 - fluticasone (FLONASE) 50 MCG/ACT nasal spray; Place 2 sprays into both nostrils daily.  Dispense: 16 g; Refill: 6 - loratadine (CLARITIN) 10 MG tablet; Take 1 tablet (10 mg total) by mouth daily.  Dispense: 30 tablet; Refill: 11  2. Upper respiratory tract infection, unspecified type  - fluticasone (FLONASE) 50 MCG/ACT nasal spray; Place 2 sprays into both nostrils daily.  Dispense: 16 g; Refill: 6 - loratadine (CLARITIN) 10 MG tablet; Take 1 tablet (10 mg total) by mouth daily.  Dispense: 30 tablet; Refill: 11  3. Fibromyalgia Refill muscle relaxer  - cyclobenzaprine (FLEXERIL) 10 MG tablet; Take 1 tablet (10 mg total) by mouth daily.  Dispense: 90 tablet; Refill: 0 Follow Up Instructions:    I  discussed the assessment and treatment plan with the patient. The patient was provided an opportunity to ask questions and all were answered. The patient agreed with the plan and demonstrated an understanding of the instructions.   The patient was advised to call back or seek an in-person evaluation if the symptoms worsen or if the condition fails to improve as anticipated.  I provided 25 minutes of non-face-to-face time during this encounter.   Donato Schultz, DO

## 2020-06-20 ENCOUNTER — Other Ambulatory Visit: Payer: Self-pay

## 2020-06-20 ENCOUNTER — Telehealth (INDEPENDENT_AMBULATORY_CARE_PROVIDER_SITE_OTHER): Payer: Medicare HMO | Admitting: Family Medicine

## 2020-06-20 VITALS — BP 131/54 | HR 87 | Temp 96.8°F | Ht 63.0 in | Wt 115.0 lb

## 2020-06-20 DIAGNOSIS — J40 Bronchitis, not specified as acute or chronic: Secondary | ICD-10-CM

## 2020-06-20 DIAGNOSIS — B370 Candidal stomatitis: Secondary | ICD-10-CM | POA: Diagnosis not present

## 2020-06-20 MED ORDER — NYSTATIN 100000 UNIT/ML MT SUSP: 5.0000 mL | Freq: Four times a day (QID) | OROMUCOSAL | 0 refills | Status: DC

## 2020-06-20 MED ORDER — AMOXICILLIN-POT CLAVULANATE 875-125 MG PO TABS: 1.0000 | ORAL_TABLET | Freq: Two times a day (BID) | ORAL | 0 refills | Status: DC

## 2020-06-20 MED ORDER — FLUCONAZOLE 150 MG PO TABS: ORAL_TABLET | ORAL | 0 refills | Status: DC

## 2020-06-20 NOTE — Progress Notes (Signed)
Virtual Visit via Video Note  I connected with Jaclyn Bennett on 06/20/20 at  1:40 PM EST by a video enabled telemedicine application and verified that I am speaking with the correct person using two identifiers.  Location: Patient: home alone  Provider: office    I discussed the limitations of evaluation and management by telemedicine and the availability of in person appointments. The patient expressed understanding and agreed to proceed.  History of Present Illness: Pt is home and c/o developing sore throat and tongue with doxycycline  She would like abx changeds    Observations/Objective: Vitals:   06/20/20 1324  BP: (!) 131/54  Pulse: 87  Temp: (!) 96.8 F (36 C)   Pt is in nad Tongue is white-- difficult to see throat on video   Assessment and Plan: 1. Bronchitis Change abx--- to augmentin  Diflucan for possible vaginitis  con't with fessalon for cough  - amoxicillin-clavulanate (AUGMENTIN) 875-125 MG tablet; Take 1 tablet by mouth 2 (two) times daily.  Dispense: 20 tablet; Refill: 0 - fluconazole (DIFLUCAN) 150 MG tablet; 1 po x1, may repeat in 3 days prn  Dispense: 2 tablet; Refill: 0  2. Thrush  - nystatin (MYCOSTATIN) 100000 UNIT/ML suspension; Take 5 mLs (500,000 Units total) by mouth 4 (four) times daily. Swish and spit  Dispense: 60 mL; Refill: 0   Follow Up Instructions:    I discussed the assessment and treatment plan with the patient. The patient was provided an opportunity to ask questions and all were answered. The patient agreed with the plan and demonstrated an understanding of the instructions.   The patient was advised to call back or seek an in-person evaluation if the symptoms worsen or if the condition fails to improve as anticipated.  I provided 25 minutes of non-face-to-face time during this encounter.   Donato Schultz, DO

## 2020-06-22 ENCOUNTER — Encounter: Payer: Self-pay | Admitting: Family Medicine

## 2020-07-02 ENCOUNTER — Other Ambulatory Visit: Payer: Self-pay

## 2020-07-02 ENCOUNTER — Ambulatory Visit (HOSPITAL_BASED_OUTPATIENT_CLINIC_OR_DEPARTMENT_OTHER)
Admission: RE | Admit: 2020-07-02 | Discharge: 2020-07-02 | Disposition: A | Discharge status: Home / Self Care | Payer: Medicare HMO | Source: Ambulatory Visit | Attending: Cardiology | Admitting: Cardiology

## 2020-07-02 DIAGNOSIS — I420 Dilated cardiomyopathy: Secondary | ICD-10-CM | POA: Insufficient documentation

## 2020-07-02 LAB — ECHOCARDIOGRAM COMPLETE
Area-P 1/2: 3.74 cm2
Calc EF: 43.8 %
P 1/2 time: 669 msec
S' Lateral: 3.4 cm
Single Plane A2C EF: 51.3 %
Single Plane A4C EF: 42.4 %

## 2020-07-07 ENCOUNTER — Telehealth: Payer: Self-pay | Admitting: Cardiology

## 2020-07-07 NOTE — Telephone Encounter (Signed)
New message:    Patient calling to ask if some one to call her concerning her ECHO and her energy level. Please call patient back.

## 2020-07-07 NOTE — Telephone Encounter (Signed)
Pt called stating she saw ECHO results on mychart and concerned due to mildly reduced EF. Pt state she reported to Dr. Stanford Breed at last OV that she experiences fatigue easily and it takes her forever to recover after cleaning the house. Pt is questioning if MD recommends anything further, and should she be concerned that her EF may continue to reduce.

## 2020-07-07 NOTE — Telephone Encounter (Signed)
LV function mildly reduced but this has been seen previously.  Would continue present medications. Kirk Ruths

## 2020-07-07 NOTE — Telephone Encounter (Signed)
Spoke with pt, aware of low EF% back to 2015. Questions answered.

## 2020-07-14 DIAGNOSIS — H2513 Age-related nuclear cataract, bilateral: Secondary | ICD-10-CM | POA: Diagnosis not present

## 2020-07-24 ENCOUNTER — Other Ambulatory Visit: Payer: Self-pay | Admitting: Family Medicine

## 2020-07-24 DIAGNOSIS — E785 Hyperlipidemia, unspecified: Secondary | ICD-10-CM

## 2020-07-28 ENCOUNTER — Other Ambulatory Visit: Payer: Self-pay | Admitting: Family Medicine

## 2020-07-28 DIAGNOSIS — E039 Hypothyroidism, unspecified: Secondary | ICD-10-CM

## 2020-09-01 ENCOUNTER — Other Ambulatory Visit: Payer: Self-pay | Admitting: Cardiology

## 2020-09-22 ENCOUNTER — Other Ambulatory Visit: Payer: Self-pay | Admitting: Family Medicine

## 2020-09-23 ENCOUNTER — Encounter: Payer: Self-pay | Admitting: Family Medicine

## 2020-09-23 ENCOUNTER — Other Ambulatory Visit: Payer: Self-pay

## 2020-09-23 ENCOUNTER — Ambulatory Visit (INDEPENDENT_AMBULATORY_CARE_PROVIDER_SITE_OTHER): Payer: Medicare Other | Admitting: Family Medicine

## 2020-09-23 VITALS — BP 110/62 | HR 73 | Temp 98.2°F | Resp 18 | Ht 63.0 in | Wt 116.4 lb

## 2020-09-23 DIAGNOSIS — E785 Hyperlipidemia, unspecified: Secondary | ICD-10-CM

## 2020-09-23 DIAGNOSIS — E039 Hypothyroidism, unspecified: Secondary | ICD-10-CM | POA: Diagnosis not present

## 2020-09-23 DIAGNOSIS — M797 Fibromyalgia: Secondary | ICD-10-CM | POA: Diagnosis not present

## 2020-09-23 MED ORDER — CYCLOBENZAPRINE HCL 10 MG PO TABS: 10.0000 mg | ORAL_TABLET | Freq: Every day | ORAL | 1 refills | Status: DC

## 2020-09-23 NOTE — Assessment & Plan Note (Signed)
Encouraged heart healthy diet, increase exercise, avoid trans fats, consider a krill oil cap daily 

## 2020-09-23 NOTE — Progress Notes (Signed)
Patient ID: Jaclyn Bennett, female    DOB: Jan 17, 1951  Age: 70 y.o. MRN: [id number]    Subjective:  Subjective  HPI Jaclyn Bennett presents for office visit today. She reports that she has trouble sleeping due to her fibromyalgia. She states that she needs medication refill. She denies any chest pain, SOB, fever, abdominal pain, cough, chills, sore throat, dysuria, urinary incontinence, back pain, HA, or N/VD. She states that on a past visit to her cardiologist Dr. Stanford Breed, she was dx with congestive dilated cardiomyopathy which is contributing to her tiredness.   Review of Systems  Constitutional: Negative for chills, fatigue and fever.  HENT: Negative for congestion, rhinorrhea, sinus pressure, sinus pain and sore throat.   Eyes: Negative for pain.  Respiratory: Negative for cough and shortness of breath.   Cardiovascular: Negative for chest pain, palpitations and leg swelling.  Gastrointestinal: Negative for abdominal pain, blood in stool, diarrhea, nausea and vomiting.  Genitourinary: Negative for decreased urine volume, flank pain, frequency, vaginal bleeding and vaginal discharge.  Musculoskeletal: Negative for back pain.  Neurological: Negative for headaches.    History Past Medical History:  Diagnosis Date  . Acute asthmatic bronchitis   . Anxiety   . Fibromyalgia   . Herpes simplex type 2 infection 03/30/2016  . Hypercholesterolemia   . Hypothyroidism   . Interstitial cystitis     She has a past surgical history that includes Total abdominal hysterectomy (1997) and left heart catheterization with coronary angiogram (Bilateral, 03/13/2014).   Her family history includes Alzheimer's disease in her father; Heart failure in her father and mother; Hyperlipidemia in her sister; Other in her mother and sister; Pancreatic cancer in her brother; Pneumonia in her mother; Stroke in her father.She reports that she has never smoked. She has never used smokeless tobacco. She  reports current alcohol use. She reports that she does not use drugs.  Current Outpatient Medications on File Prior to Visit  Medication Sig Dispense Refill  . aspirin 81 MG tablet Take 81 mg by mouth daily.    Marland Kitchen atorvastatin (LIPITOR) 20 MG tablet TAKE 1/2 TABLET BY MOUTH EVERY DAY 15 tablet 0  . Cholecalciferol (VITAMIN D) 1000 UNITS capsule Take 5,000 Units by mouth daily.    . Coconut Oil 1000 MG CAPS Take 1,000 mg by mouth daily.     . Coenzyme Q10 (COQ10) 100 MG CAPS Take 1 capsule by mouth daily.    . COLLAGEN PO Take 1 capsule by mouth daily.    Marland Kitchen estradiol (ESTRACE) 0.5 MG tablet Take 0.25 mg by mouth every other day.     . famciclovir (FAMVIR) 250 MG tablet Take 0.5 tablets (125 mg total) by mouth daily. 15 tablet 4  . levothyroxine (SYNTHROID) 75 MCG tablet Take 1 tablet (75 mcg total) by mouth daily before breakfast. 90 tablet 1  . lisinopril (ZESTRIL) 2.5 MG tablet TAKE ONE (1) TABLET BY MOUTH EVERY DAY 90 tablet 1  . Multiple Vitamins-Minerals (ZINC PO) Take by mouth daily.    . NON FORMULARY Take 2 tablets by mouth daily. Bone-Up: Calcium Supplement    . vitamin C (ASCORBIC ACID) 500 MG tablet Take 1,000 mg by mouth daily.    . vitamin E 400 UNIT capsule Take 400 Units by mouth daily.     No current facility-administered medications on file prior to visit.     Objective:  Objective  Physical Exam Constitutional:      General: She is not in acute distress.  Appearance: Normal appearance. She is not ill-appearing or toxic-appearing.  HENT:     Head: Normocephalic and atraumatic.     Right Ear: Tympanic membrane, ear canal and external ear normal.     Left Ear: Tympanic membrane, ear canal and external ear normal.     Nose: No congestion or rhinorrhea.  Eyes:     Extraocular Movements: Extraocular movements intact.     Pupils: Pupils are equal, round, and reactive to light.  Cardiovascular:     Rate and Rhythm: Normal rate and regular rhythm.     Pulses: Normal  pulses.     Heart sounds: Normal heart sounds. No murmur heard.   Pulmonary:     Effort: Pulmonary effort is normal. No respiratory distress.     Breath sounds: Normal breath sounds. No wheezing, rhonchi or rales.  Abdominal:     General: Bowel sounds are normal.     Palpations: Abdomen is soft. There is no mass.     Tenderness: There is no abdominal tenderness. There is no guarding.     Hernia: No hernia is present.  Musculoskeletal:        General: Normal range of motion.     Cervical back: Normal range of motion and neck supple.  Skin:    General: Skin is warm and dry.  Neurological:     Mental Status: She is alert and oriented to person, place, and time.  Psychiatric:        Behavior: Behavior normal.    BP 110/62 (BP Location: Left Arm, Patient Position: Sitting, Cuff Size: Normal)   Pulse 73   Temp 98.2 F (36.8 C) (Oral)   Resp 18   Ht 5\' 3"  (1.6 m)   Wt 116 lb 6.4 oz (52.8 kg)   SpO2 99%   BMI 20.62 kg/m  Wt Readings from Last 3 Encounters:  09/23/20 116 lb 6.4 oz (52.8 kg)  06/20/20 115 lb (52.2 kg)  06/18/20 115 lb (52.2 kg)     Lab Results  Component Value Date   WBC 5.7 08/03/2018   HGB 13.2 08/03/2018   HCT 39.7 08/03/2018   PLT 345.0 08/03/2018   GLUCOSE 97 06/18/2019   CHOL 154 06/18/2019   TRIG 154.0 (H) 06/18/2019   HDL 46.70 06/18/2019   LDLDIRECT 114.0 08/03/2018   LDLCALC 76 06/18/2019   ALT 30 06/18/2019   AST 27 06/18/2019   NA 141 06/18/2019   K 3.9 06/18/2019   CL 102 06/18/2019   CREATININE 0.78 06/18/2019   BUN 14 06/18/2019   CO2 31 06/18/2019   TSH 1.76 08/03/2018   INR 1.03 03/04/2014   HGBA1C 5.8 08/12/2016    ECHOCARDIOGRAM COMPLETE  Result Date: 07/02/2020    ECHOCARDIOGRAM REPORT   Patient Name:   Jaclyn Bennett Date of Exam: 07/02/2020 Medical Rec #:  [id number]           Height:       63.0 in Accession #:    [id number]          Weight:       115.0 lb Date of Birth:  [birth year]            BSA:          1.528 m  Patient Age:    18 years            BP:           111/69 mmHg Patient Gender: F  HR:           78 bpm. Exam Location:  [place] [facility] Procedure: 2D Echo, 3D Echo, Cardiac Doppler and Color Doppler Indications:    I42.0 Dilated cardiomyopathy  History:        Patient has prior history of Echocardiogram examinations, most                 recent 03/30/2018. Cardiomyopathy, Signs/Symptoms:Dyspnea and                 Fatigue; Risk Factors:Dyslipidemia and Non-Smoker.  Sonographer:    Geradine Girt Referring Phys: [place]  1. Left ventricular ejection fraction, by estimation, is 40 to 45%. The left ventricle has mildly decreased function. The left ventricle demonstrates global hypokinesis. Left ventricular diastolic parameters are consistent with Grade I diastolic dysfunction (impaired relaxation).  2. Right ventricular systolic function is normal. The right ventricular size is normal. There is normal pulmonary artery systolic pressure.  3. The mitral valve is normal in structure. Trivial mitral valve regurgitation. No evidence of mitral stenosis.  4. The aortic valve is normal in structure. Aortic valve regurgitation is not visualized. No aortic stenosis is present.  5. The inferior vena cava is normal in size with greater than 50% respiratory variability, suggesting right atrial pressure of 3 mmHg. FINDINGS  Left Ventricle: Left ventricular ejection fraction, by estimation, is 40 to 45%. The left ventricle has mildly decreased function. The left ventricle demonstrates global hypokinesis. The left ventricular internal cavity size was normal in size. There is  no left ventricular hypertrophy. Left ventricular diastolic parameters are consistent with Grade I diastolic dysfunction (impaired relaxation). Right Ventricle: The right ventricular size is normal. No increase in right ventricular wall thickness. Right ventricular systolic function is normal. There is normal pulmonary artery  systolic pressure. The tricuspid regurgitant velocity is 2.09 m/s, and  with an assumed right atrial pressure of 3 mmHg, the estimated right ventricular systolic pressure is 46.8 mmHg. Left Atrium: Left atrial size was normal in size. Right Atrium: Right atrial size was normal in size. Pericardium: There is no evidence of pericardial effusion. Mitral Valve: The mitral valve is normal in structure. Trivial mitral valve regurgitation. No evidence of mitral valve stenosis. Tricuspid Valve: The tricuspid valve is normal in structure. Tricuspid valve regurgitation is trivial. No evidence of tricuspid stenosis. Aortic Valve: The aortic valve is normal in structure. Aortic valve regurgitation is not visualized. Aortic regurgitation PHT measures 669 msec. No aortic stenosis is present. Pulmonic Valve: The pulmonic valve was normal in structure. Pulmonic valve regurgitation is not visualized. No evidence of pulmonic stenosis. Aorta: The aortic root is normal in size and structure. Venous: The inferior vena cava is normal in size with greater than 50% respiratory variability, suggesting right atrial pressure of 3 mmHg. IAS/Shunts: No atrial level shunt detected by color flow Doppler.  LEFT VENTRICLE PLAX 2D LVIDd:         4.15 cm     Diastology LVIDs:         3.40 cm     LV e' medial:    6.42 cm/s LV PW:         0.81 cm     LV E/e' medial:  10.1 LV IVS:        0.97 cm     LV e' lateral:   8.81 cm/s LVOT diam:     1.90 cm     LV E/e' lateral: 7.4 LV SV:  41 LV SV Index:   27 LVOT Area:     2.84 cm                             3D Volume EF: LV Volumes (MOD)           3D EF:        40 % LV vol d, MOD A2C: 66.7 ml LV EDV:       83 ml LV vol d, MOD A4C: 76.2 ml LV ESV:       50 ml LV vol s, MOD A2C: 32.5 ml LV SV:        33 ml LV vol s, MOD A4C: 43.9 ml LV SV MOD A2C:     34.2 ml LV SV MOD A4C:     76.2 ml LV SV MOD BP:      31.9 ml RIGHT VENTRICLE RV S prime:     13.90 cm/s TAPSE (M-mode): 1.6 cm LEFT ATRIUM              Index       RIGHT ATRIUM          Index LA Vol (A2C):   25.6 ml 16.75 ml/m RA Area:     7.42 cm LA Vol (A4C):   13.5 ml 8.83 ml/m  RA Volume:   11.10 ml 7.26 ml/m LA Biplane Vol: 19.5 ml 12.76 ml/m  AORTIC VALVE LVOT Vmax:   69.80 cm/s LVOT Vmean:  43.000 cm/s LVOT VTI:    0.144 m AI PHT:      669 msec  AORTA Ao Root diam: 2.70 cm Ao Asc diam:  2.60 cm MITRAL VALVE               TRICUSPID VALVE MV Area (PHT): 3.74 cm    TR Peak grad:   17.5 mmHg MV Decel Time: 203 msec    TR Vmax:        209.00 cm/s MV E velocity: 65.10 cm/s MV A velocity: 73.30 cm/s  SHUNTS MV E/A ratio:  0.89        Systemic VTI:  0.14 m                            Systemic Diam: 1.90 cm Kardie Tobb DO Electronically signed by Berniece Salines DO Signature Date/Time: 07/02/2020/4:03:07 PM    Final      Assessment & Plan:  Plan   Meds ordered this encounter  Medications  . cyclobenzaprine (FLEXERIL) 10 MG tablet    Sig: Take 1 tablet (10 mg total) by mouth daily.    Dispense:  90 tablet    Refill:  1    Requested drug refills are authorized, however, the patient needs further evaluation and/or laboratory testing before further refills are given. Ask her to make an appointment for this.    Problem List Items Addressed This Visit      Unprioritized   Fibromyalgia   Relevant Medications   cyclobenzaprine (FLEXERIL) 10 MG tablet   Hyperlipidemia LDL goal <100    Encouraged heart healthy diet, increase exercise, avoid trans fats, consider a krill oil cap daily      Hypothyroidism    rto 3 months to repeat labs Lab Results  Component Value Date   TSH 1.76 08/03/2018          Other Visit Diagnoses    Hyperlipidemia, unspecified  hyperlipidemia type    -  Primary      Follow-up: Return in about 3 months (around 12/23/2020), or if symptoms worsen or fail to improve, for hyperlipidemia, hypertension.   I,David Hanna,acting as a Education administrator for [employer], DO.,have documented all relevant documentation on the  behalf of [name], DO,as directed by  [name], DO while in the presence of [place], DO, have reviewed all documentation for this visit. The documentation on 09/23/20 for the exam, diagnosis, procedures, and orders are all accurate and complete.

## 2020-09-23 NOTE — Patient Instructions (Signed)
Myofascial Pain Syndrome and Fibromyalgia Myofascial pain syndrome and fibromyalgia are both pain disorders. This pain may be felt mainly in your muscles.  Myofascial pain syndrome: ? Always has tender points in the muscle that will cause pain when pressed (trigger points). The pain may come and go. ? Usually affects your neck, upper back, and shoulder areas. The pain often radiates into your arms and hands.  Fibromyalgia: ? Has muscle pains and tenderness that come and go. ? Is often associated with fatigue and sleep problems. ? Has trigger points. ? Tends to be long-lasting (chronic), but is not life-threatening. Fibromyalgia and myofascial pain syndrome are not the same. However, they often occur together. If you have both conditions, each can make the other worse. Both are common and can cause enough pain and fatigue to make day-to-day activities difficult. Both can be hard to diagnose because their symptoms are common in many other conditions. What are the causes? The exact causes of these conditions are not known. What increases the risk? You are more likely to develop this condition if:  You have a family history of the condition.  You have certain triggers, such as: ? Spine disorders. ? An injury (trauma) or other physical stressors. ? Being under a lot of stress. ? Medical conditions such as osteoarthritis, rheumatoid arthritis, or lupus. What are the signs or symptoms? Fibromyalgia The main symptom of fibromyalgia is widespread pain and tenderness in your muscles. Pain is sometimes described as stabbing, shooting, or burning. You may also have:  Tingling or numbness.  Sleep problems and fatigue.  Problems with attention and concentration (fibro fog). Other symptoms may include:  Bowel and bladder problems.  Headaches.  Visual problems.  Problems with odors and noises.  Depression or mood changes.  Painful menstrual periods (dysmenorrhea).  Dry skin or  eyes. These symptoms can vary over time. Myofascial pain syndrome Symptoms of myofascial pain syndrome include:  Tight, ropy bands of muscle.  Uncomfortable sensations in muscle areas. These may include aching, cramping, burning, numbness, tingling, and weakness.  Difficulty moving certain parts of the body freely (poor range of motion). How is this diagnosed? This condition may be diagnosed by your symptoms and medical history. You will also have a physical exam. In general:  Fibromyalgia is diagnosed if you have pain, fatigue, and other symptoms for more than 3 months, and symptoms cannot be explained by another condition.  Myofascial pain syndrome is diagnosed if you have trigger points in your muscles, and those trigger points are tender and cause pain elsewhere in your body (referred pain). How is this treated? Treatment for these conditions depends on the type that you have.  For fibromyalgia: ? Pain medicines, such as NSAIDs. ? Medicines for treating depression. ? Medicines for treating seizures. ? Medicines that relax the muscles.  For myofascial pain: ? Pain medicines, such as NSAIDs. ? Cooling and stretching of muscles. ? Trigger point injections. ? Sound wave (ultrasound) treatments to stimulate muscles. Treating these conditions often requires a team of health care providers. These may include:  Your primary care provider.  Physical therapist.  Complementary health care providers, such as massage therapists or acupuncturists.  Psychiatrist for cognitive behavioral therapy.   Follow these instructions at home: Medicines  Take over-the-counter and prescription medicines only as told by your health care provider.  Do not drive or use heavy machinery while taking prescription pain medicine.  If you are taking prescription pain medicine, take actions to prevent or treat constipation. Your  health care provider may recommend that you: ? Drink enough fluid to keep  your urine pale yellow. ? Eat foods that are high in fiber, such as fresh fruits and vegetables, whole grains, and beans. ? Limit foods that are high in fat and processed sugars, such as fried or sweet foods. ? Take an over-the-counter or prescription medicine for constipation. Lifestyle  Exercise as directed by your health care provider or physical therapist.  Practice relaxation techniques to control your stress. You may want to try: ? Biofeedback. ? Visual imagery. ? Hypnosis. ? Muscle relaxation. ? Yoga. ? Meditation.  Maintain a healthy lifestyle. This includes eating a healthy diet and getting enough sleep.  Do not use any products that contain nicotine or tobacco, such as cigarettes and e-cigarettes. If you need help quitting, ask your health care provider.   General instructions  Talk to your health care provider about complementary treatments, such as acupuncture or massage.  Consider joining a support group with others who are diagnosed with this condition.  Do not do activities that stress or strain your muscles. This includes repetitive motions and heavy lifting.  Keep all follow-up visits as told by your health care provider. This is important. Where to find more information  National Fibromyalgia Association: www.fmaware.[place]: www.arthritis.org  American Chronic Pain Association: www.theacpa.org Contact a health care provider if:  You have new symptoms.  Your symptoms get worse or your pain is severe.  You have side effects from your medicines.  You have trouble sleeping.  Your condition is causing depression or anxiety. Summary  Myofascial pain syndrome and fibromyalgia are pain disorders.  Myofascial pain syndrome has tender points in the muscle that will cause pain when pressed (trigger points). Fibromyalgia also has muscle pains and tenderness that come and go, but this condition is often associated with fatigue and sleep  disturbances.  Fibromyalgia and myofascial pain syndrome are not the same but often occur together, causing pain and fatigue that make day-to-day activities difficult.  Treatment for fibromyalgia includes taking medicines to relax the muscles and medicines for pain, depression, or seizures. Treatment for myofascial pain syndrome includes taking medicines for pain, cooling and stretching of muscles, and injecting medicines into trigger points.  Follow your health care provider's instructions for taking medicines and maintaining a healthy lifestyle. This information is not intended to replace advice given to you by your health care provider. Make sure you discuss any questions you have with your health care provider. Document Revised: 09/22/2018 Document Reviewed: 06/15/2017 Elsevier Patient Education  2021 [employer].

## 2020-09-23 NOTE — Assessment & Plan Note (Signed)
rto 3 months to repeat labs Lab Results  Component Value Date   TSH 1.76 08/03/2018

## 2020-09-30 ENCOUNTER — Telehealth: Payer: Self-pay | Admitting: Family Medicine

## 2020-09-30 NOTE — Telephone Encounter (Signed)
Caller : [facility]  Call Back @ [phone]  Patient states she was asked to make an appointment last week for a medication refill, patient states she made appointment last week with PCP. Per patient PCP stated that she did not need an appointment for medication refill. Patient would like to make Supervisor aware of this.

## 2020-10-01 NOTE — Telephone Encounter (Signed)
In her chart I put she needed repeat labs in 3 months--- its in the ov

## 2020-10-01 NOTE — Telephone Encounter (Signed)
Patient called back. I informed her that her concerns was sent to our Supervisor, And that she would be contacted after the Supervisor speaks to Dr. Etter Sjogren.

## 2020-10-02 NOTE — Telephone Encounter (Signed)
Spoke with patient and she wanted to make sure no one, including her provider, was in trouble from her calling the office.  After we chatted for a bit I expressed to her it was a good idea she came to see Dr. Etter Sjogren because it had been over two years since she had an in person visit with her PCP and had only been seen for virtual acute visits.  Patient agreed as she has seen her cardiologist for some heart concerns and she was able to talk with Dr. Etter Sjogren in person regarding those along with her concerns about possible travel to [place] since her son has purchased land there. Patient expressed appreciation for my return call and I advised her to please reach out to me with any further concerns.

## 2020-10-09 ENCOUNTER — Telehealth: Payer: Self-pay | Admitting: Cardiology

## 2020-10-09 NOTE — Telephone Encounter (Signed)
Spoke with patient of Dr. Stanford Breed - reports the following symptoms as noted below -- chest heaviness, bloating in abdomen, legs weak, shortness of breath, decreased energy   -- symptoms going on for a few weeks but not reported during her last visit with PCP a few weeks ago  She asked for an appointment - no openings at [facility] or [facility] with MD No APP visits next week  Recent home BP readings: 116/62, 128/66 -- does not check routinely She does not weigh routinely   Advised ED precautions She wants Dr. Stanford Breed to review her concerns and advise

## 2020-10-09 NOTE — Telephone Encounter (Signed)
Pt c/o of Chest Pain: STAT if CP now or developed within 24 hours  1. Are you having CP right now? yes  2. Are you experiencing any other symptoms (ex. SOB, nausea, vomiting, sweating)? SOB , fatigue,   3. How long have you been experiencing CP? Several days  4. Is your CP continuous or coming and going? continuous  5. Have you taken Nitroglycerin? No, does not have it   Patient states she has been having a heaviness in her chest. She states she is having it now and also gets SOB and is very fatigued. She states she "just doesn't feel right". She states her stomach is also bloating.

## 2020-10-14 NOTE — Progress Notes (Signed)
Cardiology Clinic Note   Patient Name: Jaclyn Bennett Date of Encounter: 10/16/2020  Primary Care Provider:  Carollee Herter, Alferd Apa, DO Primary Cardiologist:  Kirk Ruths, MD  Patient Profile    Jaclyn Bennett 70 year old female presents the clinic today for evaluation of her chest pain.  Past Medical History    Past Medical History:  Diagnosis Date  . Acute asthmatic bronchitis   . Anxiety   . Fibromyalgia   . Herpes simplex type 2 infection 03/30/2016  . Hypercholesterolemia   . Hypothyroidism   . Interstitial cystitis    Past Surgical History:  Procedure Laterality Date  . LEFT HEART CATHETERIZATION WITH CORONARY ANGIOGRAM Bilateral 03/13/2014   Procedure: LEFT HEART CATHETERIZATION WITH CORONARY ANGIOGRAM;  Surgeon: Blane Ohara, MD;  Location: [facility] CATH LAB;  Service: Cardiovascular;  Laterality: Bilateral;  . TOTAL ABDOMINAL HYSTERECTOMY  1997    Allergies  Allergies  Allergen Reactions  . Iodine Anaphylaxis  . Valtrex [Valacyclovir Hcl] Other (See Comments)    Severe nausea   . Anesthetics, Amide Nausea And Vomiting  . Benadryl Allergy [Diphenhydramine Hcl (Sleep)]     Makes patient hyper  . Sulfonamide Derivatives     REACTION: throat swelling and itching    History of Present Illness    Ms. Sprunger has a PMH of cardiomyopathy, atypical chest pain, and HLD.  She had a stress echocardiogram 9/15 which showed a EF of 45%, inferior, septal, and apical hypokinesis both at rest and during stress.  Her cardiac catheterization 9/15 showed normal coronary arteries.  An echocardiogram 10/19 showed an EF of 50-55%, G1 DD, mild mitral valve regurgitation.  She was seen by Dr. Stanford Breed on 05/14/2020.  During that time she reported dyspnea with long walks.  She denied orthopnea, PND, lower extremity swelling, exertional chest discomfort and syncope.  She reported fatigue.  Echocardiogram 07/02/2020 showed an LVEF of 40-45%, G1 DD, trivial, and mitral valve  regurgitation.  She contacted the nurse triage line on 10/09/2020.  She reported chest heaviness, bloating in her abdomen, weak legs, shortness of breath, and decreased energy.  Her blood pressure was 116/62 128/66.  She was contacted by clinic RN 10/14/2020 and reported that she was feeling better.  She was instructed to keep her follow-up appointment.  She presents the clinic today for follow-up evaluation states she has been having some increased shortness of breath and fatigue.  She reported chest heaviness, shortness of breath, and decreased energy over the last week.  When asked about recent illnesses, sick contacts and fevers she reports that her son was admitted to the hospital last week with chest pain.  This caused her to have a great deal of stress.  She reports that 4 years ago she also had a significant amount of stress with the passing of her daughter.  She remains very physically active playing the organ at church, going to Bible study, and staying active with her grandchildren.  We reviewed her echocardiogram and gave her a copy of her EKG.  I will order a BMP, CBC, echocardiogram, TSH, and have her follow-up in 1 month.  Today she denies chest pain, shortness of breath, lower extremity edema, fatigue, palpitations, melena, hematuria, hemoptysis, diaphoresis, weakness, presyncope, syncope, orthopnea, and PND.   Home Medications    Prior to Admission medications   Medication Sig Start Date End Date Taking? Authorizing Provider  aspirin 81 MG tablet Take 81 mg by mouth daily.    [provider]  atorvastatin (  LIPITOR) 20 MG tablet TAKE 1/2 TABLET BY MOUTH EVERY DAY 07/24/20   Carollee Herter, Alferd Apa, DO  Cholecalciferol (VITAMIN D) 1000 UNITS capsule Take 5,000 Units by mouth daily.    [provider]  Coconut Oil 1000 MG CAPS Take 1,000 mg by mouth daily.     [provider]  Coenzyme Q10 (COQ10) 100 MG CAPS Take 1 capsule by mouth daily.    [provider]  COLLAGEN PO Take 1 capsule by mouth daily.    [provider]  cyclobenzaprine (FLEXERIL) 10 MG tablet Take 1 tablet (10 mg total) by mouth daily. 09/23/20   [name], DO  estradiol (ESTRACE) 0.5 MG tablet Take 0.25 mg by mouth every other day.     [provider]  famciclovir (FAMVIR) 250 MG tablet Take 0.5 tablets (125 mg total) by mouth daily. 09/22/20   [name], DO  levothyroxine (SYNTHROID) 75 MCG tablet Take 1 tablet (75 mcg total) by mouth daily before breakfast. 07/28/20   Carollee Herter, Alferd Apa, DO  lisinopril (ZESTRIL) 2.5 MG tablet TAKE ONE (1) TABLET BY MOUTH EVERY DAY 09/01/20   Lelon Perla, MD  Multiple Vitamins-Minerals (ZINC PO) Take by mouth daily.    [provider]  NON FORMULARY Take 2 tablets by mouth daily. Bone-Up: Calcium Supplement    [provider]  vitamin C (ASCORBIC ACID) 500 MG tablet Take 1,000 mg by mouth daily.    [provider]  vitamin E 400 UNIT capsule Take 400 Units by mouth daily.    [provider]    Family History    Family History  Problem Relation Age of Onset  . Other Mother        bronchiectasis  . Pneumonia Mother   . Heart failure Mother   . Alzheimer's disease Father   . Stroke Father   . Heart failure Father   . Pancreatic cancer Brother   . Hyperlipidemia Sister   . Other Sister        bronchiectasis   She indicated that her mother is deceased. She indicated that her father is deceased. She indicated that the status of her brother is unknown. She indicated that her maternal grandmother is deceased. She indicated that her maternal grandfather is deceased. She indicated that her paternal grandmother is deceased. She indicated that her paternal grandfather is deceased.  Social History    Social History   Socioeconomic History  . Marital status: Divorced    Spouse name: Not on file  . Number of children: 3  . Years of education: Not on file  .  Highest education level: Not on file  Occupational History  . Occupation: Education officer, museum - substitute teaching now  Tobacco Use  . Smoking status: Never Smoker  . Smokeless tobacco: Never Used  Vaping Use  . Vaping Use: Never used  Substance and Sexual Activity  . Alcohol use: Yes    Comment: Rare  . Drug use: No  . Sexual activity: Not on file  Other Topics Concern  . Not on file  Social History Narrative   Plays piano, 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   Declines flu and pneumonia shot 04-30-10   Social Determinants of Health   Financial Resource Strain: Not on file  Food Insecurity: Not on file  Transportation Needs: Not on file  Physical Activity: Not on file  Stress: Not on file  Social Connections: Not on file  Intimate Partner Violence:  Not on file     Review of Systems    General:  No chills, fever, night sweats or weight changes.  Cardiovascular:  No chest pain, dyspnea on exertion, edema, orthopnea, palpitations, paroxysmal nocturnal dyspnea. Dermatological: No rash, lesions/masses Respiratory: No cough, dyspnea Urologic: No hematuria, dysuria Abdominal:   No nausea, vomiting, diarrhea, bright red blood per rectum, melena, or hematemesis Neurologic:  No visual changes, wkns, changes in mental status. All other systems reviewed and are otherwise negative except as noted above.  Physical Exam    VS:  BP 102/62   Pulse 80   Ht 5\' 3"  (1.6 m)   Wt 116 lb (52.6 kg)   SpO2 100%   BMI 20.55 kg/m  , BMI Body mass index is 20.55 kg/m. GEN: Well nourished, well developed, in no acute distress. HEENT: normal. Neck: Supple, no JVD, carotid bruits, or masses. Cardiac: RRR, no murmurs, rubs, or gallops. No clubbing, cyanosis, edema.  Radials/DP/PT 2+ and equal bilaterally.  Respiratory:  Respirations regular and unlabored, clear to auscultation bilaterally. GI: Soft, nontender, nondistended, BS + x 4. MS: no deformity or atrophy. Skin: warm and dry, no rash. Neuro:  Strength  and sensation are intact. Psych: Normal affect.  Accessory Clinical Findings    Recent Labs: No results found for requested labs within last 8760 hours.   Recent Lipid Panel    Component Value Date/Time   CHOL 154 06/18/2019 0834   TRIG 154.0 (H) 06/18/2019 0834   HDL 46.70 06/18/2019 0834   CHOLHDL 3 06/18/2019 0834   VLDL 30.8 06/18/2019 0834   LDLCALC 76 06/18/2019 0834   LDLDIRECT 114.0 08/03/2018 1056    ECG personally reviewed by me today-normal sinus rhythm left axis deviation septal infarct undetermined age ST abnormality possible inferior injury 80 bpm- No acute changes  Echocardiogram 07/02/2020 IMPRESSIONS    1. Left ventricular ejection fraction, by estimation, is 40 to 45%. The  left ventricle has mildly decreased function. The left ventricle  demonstrates global hypokinesis. Left ventricular diastolic parameters are  consistent with Grade I diastolic  dysfunction (impaired relaxation).  2. Right ventricular systolic function is normal. The right ventricular  size is normal. There is normal pulmonary artery systolic pressure.  3. The mitral valve is normal in structure. Trivial mitral valve  regurgitation. No evidence of mitral stenosis.  4. The aortic valve is normal in structure. Aortic valve regurgitation is  not visualized. No aortic stenosis is present.  5. The inferior vena cava is normal in size with greater than 50%  respiratory variability, suggesting right atrial pressure of 3 mmHg.  Assessment & Plan   1.  Chest discomfort- no chest pain today.  Contact nurse triage line  10/09/2020.  She reported chest heaviness, bloating in her abdomen, weak legs, shortness of breath, and decreased energy.  Her blood pressure was 116/62 128/66.  She was contacted by clinic RN 10/14/2020 and reported that she was feeling better. Continue to monitor. Heart healthy low-sodium diet-salty 6 given Increase physical activity as tolerated   Nonischemic  cardiomyopathy- continues with increased fatigue.  Notes increased shortness of breath over the last week.  Reports her son was admitted to the hospital with chest pain which caused her an increased amount of stress.  Euvolemic today.  Echocardiogram 07/02/2020 showed LVEF 40-45%, G1 DD, and trivial mitral valve regurgitation. Continue lisinopril Heart healthy low-sodium diet-salty 6 given Increase physical activity as tolerated Repeat echocardiogram Order BMP, CBC, TSH  Hyperlipidemia-LDL 76 on 06/18/2019 Continue atorvastatin,  co-Q10 Heart healthy low-sodium diet-salty 6 given Increase physical activity as tolerated   Disposition: Follow-up with Dr. Stanford Breed or me in 1  month.   Jossie Ng. Velera Lansdale NP-C    10/16/2020, 4:37 PM [place] [address] Office [phone] Fax [phone]  Notice: This dictation was prepared with Dragon dictation along with smaller phrase technology. Any transcriptional errors that result from this process are unintentional and may not be corrected upon review.  I spent 17 minutes examining this patient, reviewing medications, and using patient centered shared decision making involving her cardiac care.  Prior to her visit I spent greater than 20 minutes reviewing her past medical history,  medications, and prior cardiac tests.

## 2020-10-14 NOTE — Telephone Encounter (Signed)
Spoke with pt, she reports she is feeling better, Follow up scheduled.

## 2020-10-16 ENCOUNTER — Encounter: Payer: Self-pay | Admitting: General Practice

## 2020-10-16 ENCOUNTER — Ambulatory Visit: Payer: Medicare Other | Admitting: General Practice

## 2020-10-16 ENCOUNTER — Other Ambulatory Visit: Payer: Self-pay

## 2020-10-16 VITALS — BP 102/62 | HR 80 | Ht 63.0 in | Wt 116.0 lb

## 2020-10-16 DIAGNOSIS — I420 Dilated cardiomyopathy: Secondary | ICD-10-CM | POA: Diagnosis not present

## 2020-10-16 DIAGNOSIS — R0789 Other chest pain: Secondary | ICD-10-CM | POA: Diagnosis not present

## 2020-10-16 DIAGNOSIS — Z79899 Other long term (current) drug therapy: Secondary | ICD-10-CM | POA: Diagnosis not present

## 2020-10-16 DIAGNOSIS — E7800 Pure hypercholesterolemia, unspecified: Secondary | ICD-10-CM

## 2020-10-16 NOTE — Patient Instructions (Signed)
Medication Instructions:  The current medical regimen is effective;  continue present plan and medications as directed. Please refer to the Current Medication list given to you today.  *If you need a refill on your cardiac medications before your next appointment, please call your pharmacy*  Lab Work:     CBC,BMET AND TSH TODAY      Testing/Procedures: Echocardiogram - Your physician has requested that you have an echocardiogram. Echocardiography is a painless test that uses sound waves to create images of your heart. It provides your doctor with information about the size and shape of your heart and how well your heart's chambers and valves are working. This procedure takes approximately one hour. There are no restrictions for this procedure. This will be performed at our [facility] location - [address], [address].  Special Instructions PLEASE MAINTAIN PHYSICAL ACTIVITY AS TOLERATED  Follow-Up: Your next appointment:  1 month(s) In Person with Kirk Ruths, MD OR IF UNAVAILABLE [place], FNP-C  At [facility], you and your health needs are our priority.  As part of our continuing mission to provide you with exceptional heart care, we have created designated Provider Care Teams.  These Care Teams include your primary Cardiologist (physician) and Advanced Practice Providers (APPs -  Physician Assistants and Nurse Practitioners) who all work together to provide you with the care you need, when you need it.  We recommend signing up for the patient portal called "MyChart".  Sign up information is provided on this After Visit Summary.  MyChart is used to connect with patients for Virtual Visits (Telemedicine).  Patients are able to view lab/test results, encounter notes, upcoming appointments, etc.  Non-urgent messages can be sent to your provider as well.   To learn more about what you can do with MyChart, go to [email or web address].

## 2020-10-17 LAB — BASIC METABOLIC PANEL
BUN/Creatinine Ratio: 25 (ref 12–28)
BUN: 18 mg/dL (ref 8–27)
CO2: 28 mmol/L (ref 20–29)
Calcium: 9.8 mg/dL (ref 8.7–10.3)
Chloride: 101 mmol/L (ref 96–106)
Creatinine, Ser: 0.73 mg/dL (ref 0.57–1.00)
Glucose: 107 mg/dL — ABNORMAL HIGH (ref 65–99)
Potassium: 4 mmol/L (ref 3.5–5.2)
Sodium: 141 mmol/L (ref 134–144)
eGFR: 88 mL/min/{1.73_m2} (ref >59)

## 2020-10-17 LAB — CBC
Hematocrit: 37.8 % (ref 34.0–46.6)
Hemoglobin: 12.9 g/dL (ref 11.1–15.9)
MCH: 30.7 pg (ref 26.6–33.0)
MCHC: 34.1 g/dL (ref 31.5–35.7)
MCV: 90 fL (ref 79–97)
Platelets: 347 10*3/uL (ref 150–450)
RBC: 4.2 x10E6/uL (ref 3.77–5.28)
RDW: 11.6 % — ABNORMAL LOW (ref 11.7–15.4)
WBC: 6.5 10*3/uL (ref 3.4–10.8)

## 2020-10-17 LAB — TSH: TSH: 2.03 u[IU]/mL (ref 0.450–4.500)

## 2020-10-21 ENCOUNTER — Other Ambulatory Visit: Payer: Self-pay | Admitting: Obstetrics and Gynecology

## 2020-10-21 DIAGNOSIS — E2839 Other primary ovarian failure: Secondary | ICD-10-CM

## 2020-10-21 DIAGNOSIS — Z1231 Encounter for screening mammogram for malignant neoplasm of breast: Secondary | ICD-10-CM

## 2020-11-11 ENCOUNTER — Ambulatory Visit (HOSPITAL_COMMUNITY): Payer: Medicare Other | Attending: Internal Medicine

## 2020-11-11 ENCOUNTER — Other Ambulatory Visit: Payer: Self-pay

## 2020-11-11 DIAGNOSIS — I420 Dilated cardiomyopathy: Secondary | ICD-10-CM | POA: Diagnosis not present

## 2020-11-11 LAB — ECHOCARDIOGRAM COMPLETE
Area-P 1/2: 2.69 cm2
S' Lateral: 2.9 cm

## 2020-11-13 ENCOUNTER — Telehealth: Payer: Self-pay | Admitting: General Practice

## 2020-11-13 NOTE — Telephone Encounter (Signed)
    Pt would like ask Jaclyn Bennett if she still need to come in on Tuesday for f/u since she already saw her echo result on mychart

## 2020-11-13 NOTE — Telephone Encounter (Signed)
This RN returned pt's call, patient would like to know if she should keep her appointment for 6/7 since she was able to see her echo results on MyChart. This RN relayed the following information from Coletta Memos, NP's note from her appointment on 10/16/2020:  Nonischemic cardiomyopathy- continues with increased fatigue.  Notes increased shortness of breath over the last week.  Reports her son was admitted to the hospital with chest pain which caused her an increased amount of stress.  Euvolemic today.  Echocardiogram 07/02/2020 showed LVEF 40-45%, G1 DD, and trivial mitral valve regurgitation. Continue lisinopril Heart healthy low-sodium diet-salty 6 given Increase physical activity as tolerated Repeat echocardiogram Order BMP, CBC, TSH  Disposition: Follow-up with Dr. Stanford Breed or me in 1  month.    This RN let patient know it was Jesse's recommendation for her to return a month from her previous appointment. Patient let this RN know she would like to keep the appointment. All questions/concerns addressed at this time.

## 2020-11-17 NOTE — Progress Notes (Signed)
Cardiology Clinic Note   Patient Name: Jaclyn Bennett Date of Encounter: 11/18/2020  Primary Care Provider:  Carollee Herter, Alferd Apa, DO Primary Cardiologist:  Kirk Ruths, MD  Patient Profile    Jaclyn Bennett 70 year old female presents the clinic today for evaluation of her chest pain.  Past Medical History    Past Medical History:  Diagnosis Date  . Acute asthmatic bronchitis   . Anxiety   . Fibromyalgia   . Herpes simplex type 2 infection 03/30/2016  . Hypercholesterolemia   . Hypothyroidism   . Interstitial cystitis    Past Surgical History:  Procedure Laterality Date  . LEFT HEART CATHETERIZATION WITH CORONARY ANGIOGRAM Bilateral 03/13/2014   Procedure: LEFT HEART CATHETERIZATION WITH CORONARY ANGIOGRAM;  Surgeon: Blane Ohara, MD;  Location: [facility] CATH LAB;  Service: Cardiovascular;  Laterality: Bilateral;  . TOTAL ABDOMINAL HYSTERECTOMY  1997    Allergies  Allergies  Allergen Reactions  . Iodine Anaphylaxis  . Valtrex [Valacyclovir Hcl] Other (See Comments)    Severe nausea   . Anesthetics, Amide Nausea And Vomiting  . Benadryl Allergy [Diphenhydramine Hcl (Sleep)]     Makes patient hyper  . Sulfonamide Derivatives     REACTION: throat swelling and itching    History of Present Illness    Ms. Hibbitts has a PMH of cardiomyopathy, atypical chest pain, and HLD.  She had a stress echocardiogram 9/15 which showed a EF of 45%, inferior, septal, and apical hypokinesis both at rest and during stress.  Her cardiac catheterization 9/15 showed normal coronary arteries.  An echocardiogram 10/19 showed an EF of 50-55%, G1 DD, mild mitral valve regurgitation.  She was seen by Dr. Stanford Breed on 05/14/2020.  During that time she reported dyspnea with long walks.  She denied orthopnea, PND, lower extremity swelling, exertional chest discomfort and syncope.  She reported fatigue.  Echocardiogram 07/02/2020 showed an LVEF of 40-45%, G1 DD, trivial, and mitral valve  regurgitation.  She contacted the nurse triage line on 10/09/2020.  She reported chest heaviness, bloating in her abdomen, weak legs, shortness of breath, and decreased energy.  Her blood pressure was 116/62 128/66.  She was contacted by clinic RN 10/14/2020 and reported that she was feeling better.  She was instructed to keep her follow-up appointment.  She presented to the clinic 10/16/2020 for follow-up evaluation stated she has been having some increased shortness of breath and fatigue.  She reported chest heaviness, shortness of breath, and decreased energy over the last week.  When asked about recent illnesses, sick contacts and fevers she reported that her son was admitted to the hospital last week with chest pain.  This caused her to have a great deal of stress.  She reported that 4 years ago she also had a significant amount of stress with the passing of her daughter.  She remained very physically active playing the organ at church, going to Bible study, and staying active with her grandchildren.  We reviewed her echocardiogram and gave her a copy of her EKG.  I will ordered a BMP, CBC, echocardiogram, TSH, and have her follow-up in 1 month.  Her repeat echocardiogram showed an improved LVEF of 55 to 60%, G1 DD, and trivial mitral valve regurgitation.  She presents the clinic today for follow-up evaluation states she feels well.  She continues to be very physically active and active in her church.  She does not have any further shortness of breath.  She reports that her stressors with her family  are somewhat less.  She reports that her middle son has bought property in [place] and is in the process of developing it.  He would like her to come visit soon.  She is worried about the stress of the trip.  Her PCP is recommended that she cannot go.  We reviewed her echocardiogram and recent lab work.  She expressed understanding.  I will have her follow-up in 12 months.  Today she denies chest pain,  shortness of breath, lower extremity edema, fatigue, palpitations, melena, hematuria, hemoptysis, diaphoresis, weakness, presyncope, syncope, orthopnea, and PND.   Home Medications    Prior to Admission medications   Medication Sig Start Date End Date Taking? Authorizing Provider  aspirin 81 MG tablet Take 81 mg by mouth daily.    [provider]  atorvastatin (LIPITOR) 20 MG tablet TAKE 1/2 TABLET BY MOUTH EVERY DAY 07/24/20   Carollee Herter, Alferd Apa, DO  Cholecalciferol (VITAMIN D) 1000 UNITS capsule Take 5,000 Units by mouth daily.    [provider]  Coconut Oil 1000 MG CAPS Take 1,000 mg by mouth daily.     [provider]  Coenzyme Q10 (COQ10) 100 MG CAPS Take 1 capsule by mouth daily.    [provider]  COLLAGEN PO Take 1 capsule by mouth daily.    [provider]  cyclobenzaprine (FLEXERIL) 10 MG tablet Take 1 tablet (10 mg total) by mouth daily. 09/23/20   [name], DO  estradiol (ESTRACE) 0.5 MG tablet Take 0.25 mg by mouth every other day.     [provider]  famciclovir (FAMVIR) 250 MG tablet Take 0.5 tablets (125 mg total) by mouth daily. 09/22/20   [name], DO  levothyroxine (SYNTHROID) 75 MCG tablet Take 1 tablet (75 mcg total) by mouth daily before breakfast. 07/28/20   Carollee Herter, Alferd Apa, DO  lisinopril (ZESTRIL) 2.5 MG tablet TAKE ONE (1) TABLET BY MOUTH EVERY DAY 09/01/20   Lelon Perla, MD  Multiple Vitamins-Minerals (ZINC PO) Take by mouth daily.    [provider]  NON FORMULARY Take 2 tablets by mouth daily. Bone-Up: Calcium Supplement    [provider]  vitamin C (ASCORBIC ACID) 500 MG tablet Take 1,000 mg by mouth daily.    [provider]  vitamin E 400 UNIT capsule Take 400 Units by mouth daily.    [provider]    Family History    Family History  Problem Relation Age of Onset  . Other Mother        bronchiectasis  . Pneumonia Mother    . Heart failure Mother   . Alzheimer's disease Father   . Stroke Father   . Heart failure Father   . Pancreatic cancer Brother   . Hyperlipidemia Sister   . Other Sister        bronchiectasis   She indicated that her mother is deceased. She indicated that her father is deceased. She indicated that the status of her brother is unknown. She indicated that her maternal grandmother is deceased. She indicated that her maternal grandfather is deceased. She indicated that her paternal grandmother is deceased. She indicated that her paternal grandfather is deceased.  Social History    Social History   Socioeconomic History  . Marital status: Divorced    Spouse name: Not on file  . Number of children: 3  . Years of education: Not on file  . Highest education level: Not on file  Occupational History  . Occupation: Education officer, museum - substitute teaching now  Tobacco Use  . Smoking status: Never Smoker  . Smokeless tobacco: Never Used  Vaping Use  . Vaping Use: Never used  Substance and Sexual Activity  . Alcohol use: Yes    Comment: Rare  . Drug use: No  . Sexual activity: Not on file  Other Topics Concern  . Not on file  Social History Narrative   Plays piano, 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   Declines flu and pneumonia shot 04-30-10   Social Determinants of Health   Financial Resource Strain: Not on file  Food Insecurity: Not on file  Transportation Needs: Not on file  Physical Activity: Not on file  Stress: Not on file  Social Connections: Not on file  Intimate Partner Violence: Not on file     Review of Systems    General:  No chills, fever, night sweats or weight changes.  Cardiovascular:  No chest pain, dyspnea on exertion, edema, orthopnea, palpitations, paroxysmal nocturnal dyspnea. Dermatological: No rash, lesions/masses Respiratory: No cough, dyspnea Urologic: No hematuria, dysuria Abdominal:   No nausea, vomiting, diarrhea, bright red blood per rectum, melena, or  hematemesis Neurologic:  No visual changes, wkns, changes in mental status. All other systems reviewed and are otherwise negative except as noted above.  Physical Exam    VS:  BP 120/80 (BP Location: Left Arm)   Pulse 82   Ht 5\' 3"  (1.6 m)   Wt 117 lb (53.1 kg)   SpO2 99%   BMI 20.73 kg/m  , BMI Body mass index is 20.73 kg/m. GEN: Well nourished, well developed, in no acute distress. HEENT: normal. Neck: Supple, no JVD, carotid bruits, or masses. Cardiac: RRR, no murmurs, rubs, or gallops. No clubbing, cyanosis, edema.  Radials/DP/PT 2+ and equal bilaterally.  Respiratory:  Respirations regular and unlabored, clear to auscultation bilaterally. GI: Soft, nontender, nondistended, BS + x 4. MS: no deformity or atrophy. Skin: warm and dry, no rash. Neuro:  Strength and sensation are intact. Psych: Normal affect.  Accessory Clinical Findings    Recent Labs: 10/16/2020: BUN 18; Creatinine, Ser 0.73; Hemoglobin 12.9; Platelets 347; Potassium 4.0; Sodium 141; TSH 2.030   Recent Lipid Panel    Component Value Date/Time   CHOL 154 06/18/2019 0834   TRIG 154.0 (H) 06/18/2019 0834   HDL 46.70 06/18/2019 0834   CHOLHDL 3 06/18/2019 0834   VLDL 30.8 06/18/2019 0834   LDLCALC 76 06/18/2019 0834   LDLDIRECT 114.0 08/03/2018 1056    ECG personally reviewed by me today-none today.  EKG 10/16/2020 normal sinus rhythm left axis deviation septal infarct undetermined age ST abnormality possible inferior injury 80 bpm- No acute changes  Echocardiogram 07/02/2020 IMPRESSIONS    1. Left ventricular ejection fraction, by estimation, is 40 to 45%. The  left ventricle has mildly decreased function. The left ventricle  demonstrates global hypokinesis. Left ventricular diastolic parameters are  consistent with Grade I diastolic  dysfunction (impaired relaxation).  2. Right ventricular systolic function is normal. The right ventricular  size is normal. There is normal pulmonary artery  systolic pressure.  3. The mitral valve is normal in structure. Trivial mitral valve  regurgitation. No evidence of mitral stenosis.  4. The aortic valve is normal in structure. Aortic valve regurgitation is  not visualized. No aortic stenosis is present.  5. The inferior vena cava is normal in size with greater than 50%  respiratory variability, suggesting right atrial pressure of 3 mmHg.  Echocardiogram 11/11/2020 IMPRESSIONS    1. Left ventricular ejection fraction, by estimation, is 55 to 60%. The  left ventricle has normal function. The left ventricle has no regional  wall motion abnormalities. Left ventricular diastolic parameters are  consistent with Grade I diastolic  dysfunction (impaired relaxation).  2. Right ventricular systolic function is normal. The right ventricular  size is normal. Tricuspid regurgitation signal is inadequate for assessing  PA pressure.  3. The mitral valve is grossly normal. Trivial mitral valve  regurgitation.  4. The aortic valve is tricuspid. Aortic valve regurgitation is not  visualized.  5. Possible small PFO with left to right shunting. The atrial septum is  aneurysmal.   Comparison(s): Changes from prior study are noted. 07/02/2020: LVEF 40-45%.   Assessment & Plan   1.  Nonischemic cardiomyopathy- no increased DOE or activity intolerance.  Follow-up echocardiogram showed improved EF at 55-60%.  Details above.  Follow-up lab work reassuring. Continue lisinopril Heart healthy low-sodium diet-salty 6 given Increase physical activity as tolerated   Chest discomfort- no recurrence/resolved.  Contacted nurse triage line 10/09/2020.  She reported chest heaviness, bloating, leg weakness, shortness of breath, and decreased amount of energy. Continue to monitor.  Hyperlipidemia- LDL 76 from 06/18/2019 Continue co-Q10, atorvastatin Heart healthy low-sodium diet Maintain physical activity  Disposition: Follow-up with Dr. Stanford Breed or me in  12 months.   Jossie Ng. Dyani Babel NP-C    11/18/2020, 2:28 PM [facility] [address] Office [phone] Fax [phone]  Notice: This dictation was prepared with Dragon dictation along with smaller phrase technology. Any transcriptional errors that result from this process are unintentional and may not be corrected upon review.  I spent 12 minutes examining this patient, reviewing medications, and using patient centered shared decision making involving her cardiac care.  Prior to her visit I spent greater than 20 minutes reviewing her past medical history,  medications, and prior cardiac tests.

## 2020-11-18 ENCOUNTER — Ambulatory Visit: Payer: Medicare Other | Admitting: General Practice

## 2020-11-18 ENCOUNTER — Other Ambulatory Visit: Payer: Self-pay

## 2020-11-18 ENCOUNTER — Encounter: Payer: Self-pay | Admitting: General Practice

## 2020-11-18 VITALS — BP 120/80 | HR 82 | Ht 63.0 in | Wt 117.0 lb

## 2020-11-18 DIAGNOSIS — E7800 Pure hypercholesterolemia, unspecified: Secondary | ICD-10-CM | POA: Diagnosis not present

## 2020-11-18 DIAGNOSIS — I420 Dilated cardiomyopathy: Secondary | ICD-10-CM | POA: Diagnosis not present

## 2020-11-18 DIAGNOSIS — R0789 Other chest pain: Secondary | ICD-10-CM | POA: Diagnosis not present

## 2020-11-18 NOTE — Patient Instructions (Signed)
Medication Instructions:  The current medical regimen is effective;  continue present plan and medications as directed. Please refer to the Current Medication list given to you today.  *If you need a refill on your cardiac medications before your next appointment, please call your pharmacy*  Lab Work:   Testing/Procedures:  NONE    NONE  Follow-Up: Your next appointment:  1 year(s) In Person with You may see Kirk Ruths, MD  IF UNAVAILABLE JESSE CLEAVER, FNP-C or Sande Rives, PA-C    Please call our office 2 months in advance to schedule this appointment   At [facility], you and your health needs are our priority.  As part of our continuing mission to provide you with exceptional heart care, we have created designated Provider Care Teams.  These Care Teams include your primary Cardiologist (physician) and Advanced Practice Providers (APPs -  Physician Assistants and Nurse Practitioners) who all work together to provide you with the care you need, when you need it.

## 2020-11-25 ENCOUNTER — Other Ambulatory Visit: Payer: Self-pay | Admitting: Cardiology

## 2020-11-25 ENCOUNTER — Other Ambulatory Visit: Payer: Self-pay | Admitting: Family Medicine

## 2020-11-25 DIAGNOSIS — E785 Hyperlipidemia, unspecified: Secondary | ICD-10-CM

## 2020-12-23 ENCOUNTER — Other Ambulatory Visit: Payer: Medicare Other

## 2021-01-06 ENCOUNTER — Ambulatory Visit (INDEPENDENT_AMBULATORY_CARE_PROVIDER_SITE_OTHER): Payer: Medicare Other | Admitting: Family Medicine

## 2021-01-06 ENCOUNTER — Other Ambulatory Visit: Payer: Self-pay

## 2021-01-06 VITALS — BP 110/70 | HR 78 | Temp 98.0°F | Resp 18 | Ht 63.0 in | Wt 115.0 lb

## 2021-01-06 DIAGNOSIS — M797 Fibromyalgia: Secondary | ICD-10-CM | POA: Diagnosis not present

## 2021-01-06 DIAGNOSIS — M79602 Pain in left arm: Secondary | ICD-10-CM | POA: Diagnosis not present

## 2021-01-06 MED ORDER — CYCLOBENZAPRINE HCL 10 MG PO TABS: 10.0000 mg | ORAL_TABLET | Freq: Every day | ORAL | 1 refills | Status: DC

## 2021-01-06 NOTE — Progress Notes (Signed)
Subjective:   By signing my name below, I, Jaclyn Bennett, attest that this documentation has been prepared under the direction and in the presence of Dr. Roma Schanz, DO. 01/06/2021    Patient ID: Jaclyn Bennett, female    DOB: 1951/05/25, 70 y.o.   MRN: [id number]  Chief Complaint  Patient presents with   Arm Pain    X1 week, left arm, Pt states no falls or injury. Pt states pain is shoulder down to wrist. No chest pain     HPI Patient is in today for a office visit. She complains of her entire left arm hurting for the past week. It worsens when lifting her arm, playing the piano and folding arm behind her. She manages her arm by taking tylenol and ibuprofen and applying a heating pad to her arm but notes that it bothers her because her arm heats up fast from the heating pad. She does not use any topical treatments to manage her symptoms. She has a history of fibromyalgia but has stopped taking cyclobenzaprine to manage her symptoms. She has not used physical therapy to manage her fibromyalgia symptoms.  She sleeps on her left side.  She denies having any chest pain or any recent injury. She started taking magnesium supplement every night after she stopped taking cyclobenzaprine. She soon after started taking 3 mg melatonin at night as well.   Past Medical History:  Diagnosis Date   Acute asthmatic bronchitis    Anxiety    Fibromyalgia    Herpes simplex type 2 infection 03/30/2016   Hypercholesterolemia    Hypothyroidism    Interstitial cystitis     Past Surgical History:  Procedure Laterality Date   LEFT HEART CATHETERIZATION WITH CORONARY ANGIOGRAM Bilateral 03/13/2014   Procedure: LEFT HEART CATHETERIZATION WITH CORONARY ANGIOGRAM;  Surgeon: Blane Ohara, MD;  Location: [facility] CATH LAB;  Service: Cardiovascular;  Laterality: Bilateral;   TOTAL ABDOMINAL HYSTERECTOMY  1997    Family History  Problem Relation Age of Onset   Other Mother        bronchiectasis    Pneumonia Mother    Heart failure Mother    Alzheimer's disease Father    Stroke Father    Heart failure Father    Pancreatic cancer Brother    Hyperlipidemia Sister    Other Sister        bronchiectasis    Social History   Socioeconomic History   Marital status: Divorced    Spouse name: Not on file   Number of children: 3   Years of education: Not on file   Highest education level: Not on file  Occupational History   Occupation: school teacher - substitute teaching now  Tobacco Use   Smoking status: Never   Smokeless tobacco: Never  Vaping Use   Vaping Use: Never used  Substance and Sexual Activity   Alcohol use: Yes    Comment: Rare   Drug use: No   Sexual activity: Not on file  Other Topics Concern   Not on file  Social History Narrative   Plays piano, 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   Declines flu and pneumonia shot 04-30-10   Social Determinants of Health   Financial Resource Strain: Not on file  Food Insecurity: Not on file  Transportation Needs: Not on file  Physical Activity: Not on file  Stress: Not on file  Social Connections: Not on file  Intimate Partner Violence: Not on file    Outpatient Medications Prior to  Visit  Medication Sig Dispense Refill   aspirin 81 MG tablet Take 81 mg by mouth daily.     atorvastatin (LIPITOR) 20 MG tablet TAKE 1/2 TABLET BY MOUTH EVERY DAY 45 tablet 0   Cholecalciferol (VITAMIN D) 1000 UNITS capsule Take 5,000 Units by mouth daily.     Coconut Oil 1000 MG CAPS Take 1,000 mg by mouth daily.      Coenzyme Q10 (COQ10) 100 MG CAPS Take 1 capsule by mouth daily.     COLLAGEN PO Take 1 capsule by mouth daily.     estradiol (ESTRACE) 0.5 MG tablet Take 0.25 mg by mouth every other day.      famciclovir (FAMVIR) 250 MG tablet Take 0.5 tablets (125 mg total) by mouth daily. 15 tablet 4   levothyroxine (SYNTHROID) 75 MCG tablet Take 1 tablet (75 mcg total) by mouth daily before breakfast. 90 tablet 1   lisinopril (ZESTRIL) 2.5 MG tablet  TAKE ONE (1) TABLET BY MOUTH EVERY DAY 90 tablet 3   Multiple Vitamins-Minerals (ZINC PO) Take by mouth daily.     NON FORMULARY Take 2 tablets by mouth daily. Bone-Up: Calcium Supplement     vitamin C (ASCORBIC ACID) 500 MG tablet Take 1,000 mg by mouth daily.     vitamin E 400 UNIT capsule Take 400 Units by mouth daily.     cyclobenzaprine (FLEXERIL) 10 MG tablet Take 1 tablet (10 mg total) by mouth daily. 90 tablet 1   No facility-administered medications prior to visit.    Allergies  Allergen Reactions   Iodine Anaphylaxis   Valtrex [Valacyclovir Hcl] Other (See Comments)    Severe nausea    Anesthetics, Amide Nausea And Vomiting   Benadryl Allergy [Diphenhydramine Hcl (Sleep)]     Makes patient hyper   Sulfonamide Derivatives     REACTION: throat swelling and itching    Review of Systems  Constitutional:  Negative for fever and malaise/fatigue.  HENT:  Negative for congestion.   Eyes:  Negative for blurred vision.  Respiratory:  Negative for shortness of breath.   Cardiovascular:  Negative for chest pain, palpitations and leg swelling.  Gastrointestinal:  Negative for abdominal pain, blood in stool and nausea.  Genitourinary:  Negative for dysuria and frequency.  Musculoskeletal:  Positive for myalgias (Entire left arm and behind left shoulder). Negative for falls.  Skin:  Negative for rash.  Neurological:  Negative for dizziness, loss of consciousness and headaches.  Endo/Heme/Allergies:  Negative for environmental allergies.  Psychiatric/Behavioral:  Negative for depression. The patient is not nervous/anxious.       Objective:    Physical Exam Vitals and nursing note reviewed.  Constitutional:      General: She is not in acute distress.    Appearance: Normal appearance. She is not ill-appearing.  HENT:     Head: Normocephalic and atraumatic.     Right Ear: External ear normal.     Left Ear: External ear normal.  Eyes:     Extraocular Movements: Extraocular  movements intact.     Pupils: Pupils are equal, round, and reactive to light.  Cardiovascular:     Rate and Rhythm: Normal rate and regular rhythm.     Pulses: Normal pulses.     Heart sounds: Normal heart sounds. No murmur heard.   No gallop.  Pulmonary:     Effort: Pulmonary effort is normal. No respiratory distress.     Breath sounds: Normal breath sounds. No wheezing, rhonchi or rales.  Musculoskeletal:  General: Tenderness present. No swelling or signs of injury.     Right lower leg: No edema.     Left lower leg: No edema.  Skin:    General: Skin is warm and dry.  Neurological:     Mental Status: She is alert and oriented to person, place, and time.  Psychiatric:        Behavior: Behavior normal.    BP 110/70 (BP Location: Right Arm, Patient Position: Sitting, Cuff Size: Normal)   Pulse 78   Temp 98 F (36.7 C) (Oral)   Resp 18   Ht _0  (1.6 m)   Wt 115 lb (52.2 kg)   SpO2 99%   BMI 20.37 kg/m  Wt Readings from Last 3 Encounters:  01/06/21 115 lb (52.2 kg)  11/18/20 117 lb (53.1 kg)  10/16/20 116 lb (52.6 kg)    Diabetic Foot Exam - Simple   No data filed    Lab Results  Component Value Date   WBC 6.5 10/16/2020   HGB 12.9 10/16/2020   HCT 37.8 10/16/2020   PLT 347 10/16/2020   GLUCOSE 107 (H) 10/16/2020   CHOL 154 06/18/2019   TRIG 154.0 (H) 06/18/2019   HDL 46.70 06/18/2019   LDLDIRECT 114.0 08/03/2018   LDLCALC 76 06/18/2019   ALT 30 06/18/2019   AST 27 06/18/2019   NA 141 10/16/2020   K 4.0 10/16/2020   CL 101 10/16/2020   CREATININE 0.73 10/16/2020   BUN 18 10/16/2020   CO2 28 10/16/2020   TSH 2.030 10/16/2020   INR 1.03 03/04/2014   HGBA1C 5.8 08/12/2016    Lab Results  Component Value Date   TSH 2.030 10/16/2020   Lab Results  Component Value Date   WBC 6.5 10/16/2020   HGB 12.9 10/16/2020   HCT 37.8 10/16/2020   MCV 90 10/16/2020   PLT 347 10/16/2020   Lab Results  Component Value Date   NA 141 10/16/2020   K 4.0  10/16/2020   CO2 28 10/16/2020   GLUCOSE 107 (H) 10/16/2020   BUN 18 10/16/2020   CREATININE 0.73 10/16/2020   BILITOT 0.4 06/18/2019   ALKPHOS 96 06/18/2019   AST 27 06/18/2019   ALT 30 06/18/2019   PROT 6.5 06/18/2019   ALBUMIN 4.3 06/18/2019   CALCIUM 9.8 10/16/2020   EGFR 88 10/16/2020   GFR 73.25 06/18/2019   Lab Results  Component Value Date   CHOL 154 06/18/2019   Lab Results  Component Value Date   HDL 46.70 06/18/2019   Lab Results  Component Value Date   LDLCALC 76 06/18/2019   Lab Results  Component Value Date   TRIG 154.0 (H) 06/18/2019   Lab Results  Component Value Date   CHOLHDL 3 06/18/2019   Lab Results  Component Value Date   HGBA1C 5.8 08/12/2016       Assessment & Plan:   Problem List Items Addressed This Visit       Unprioritized   Fibromyalgia   Relevant Medications   cyclobenzaprine (FLEXERIL) 10 MG tablet   cyclobenzaprine (FLEXERIL) 10 MG tablet   Other Visit Diagnoses     Left arm pain    -  Primary   Relevant Orders   Ambulatory referral to Sports Medicine        Meds ordered this encounter  Medications   cyclobenzaprine (FLEXERIL) 10 MG tablet    Sig: Take 1 tablet (10 mg total) by mouth daily.    Dispense:  90  tablet    Refill:  1    Requested drug refills are authorized, however, the patient needs further evaluation and/or laboratory testing before further refills are given. Ask her to make an appointment for this.   cyclobenzaprine (FLEXERIL) 10 MG tablet    Sig: Take 1 tablet (10 mg total) by mouth daily.    Dispense:  90 tablet    Refill:  1    Requested drug refills are authorized, however, the patient needs further evaluation and/or laboratory testing before further refills are given. Ask her to make an appointment for this.    I, Dr. Roma Schanz, DO, personally preformed the services described in this documentation.  All medical record entries made by the scribe were at my direction and in my  presence.  I have reviewed the chart and discharge instructions (if applicable) and agree that the record reflects my personal performance and is accurate and complete. 01/06/2021   I,Jaclyn Bennett,acting as a Education administrator for [employer], DO.,have documented all relevant documentation on the behalf of [name], DO,as directed by  [name], DO while in the presence of [name], DO.   [name], DO

## 2021-01-06 NOTE — Patient Instructions (Signed)

## 2021-01-07 ENCOUNTER — Encounter: Payer: Self-pay | Admitting: Family Medicine

## 2021-01-07 DIAGNOSIS — G8929 Other chronic pain: Secondary | ICD-10-CM | POA: Insufficient documentation

## 2021-01-07 DIAGNOSIS — M5412 Radiculopathy, cervical region: Secondary | ICD-10-CM | POA: Insufficient documentation

## 2021-01-07 DIAGNOSIS — M79602 Pain in left arm: Secondary | ICD-10-CM | POA: Insufficient documentation

## 2021-01-07 NOTE — Assessment & Plan Note (Signed)
Restart cyclobenzaprine  Heat/ ice Sport med referral

## 2021-01-23 ENCOUNTER — Ambulatory Visit (INDEPENDENT_AMBULATORY_CARE_PROVIDER_SITE_OTHER): Payer: Medicare Other

## 2021-01-23 ENCOUNTER — Other Ambulatory Visit: Payer: Self-pay

## 2021-01-23 ENCOUNTER — Ambulatory Visit (INDEPENDENT_AMBULATORY_CARE_PROVIDER_SITE_OTHER): Payer: Medicare Other | Admitting: Sports Medicine

## 2021-01-23 DIAGNOSIS — M25512 Pain in left shoulder: Secondary | ICD-10-CM | POA: Diagnosis not present

## 2021-01-23 DIAGNOSIS — G8929 Other chronic pain: Secondary | ICD-10-CM

## 2021-01-23 MED ORDER — ACETAMINOPHEN ER 650 MG PO TBCR: 650.0000 mg | EXTENDED_RELEASE_TABLET | Freq: Three times a day (TID) | ORAL | 3 refills | Status: DC | PRN

## 2021-01-23 NOTE — Assessment & Plan Note (Signed)
This is a very pleasant 70 year old female, for several months she has had pain in her left shoulder, localized anteriorly and posteriorly at the joint line, worse with most motions including abduction, external rotation. She does get radiation of her pain to the elbow as is typical with a shoulder pain generator, nothing overtly radicular into the hand or fingertips. On exam she has pain with external rotation, abduction, minimally positive Neer's, Hawkins, empty can signs. Otherwise good rotator cuff strength, minimally positive speeds test, negative Yergason test. I do think she has glenohumeral osteoarthritis, we will increase her Tylenol to arthritis strength 2-3 times a day, adding left shoulder x-rays, [place] exercises. Return to see me in 3 to 4 weeks, we will probably do a glenohumeral joint injection if no better as I think this is her main pain generator.

## 2021-01-23 NOTE — Progress Notes (Signed)
    Procedures performed today:    None.  Independent interpretation of notes and tests performed by another provider:   None.  Brief History, Exam, Impression, and Recommendations:    Chronic left shoulder pain This is a very pleasant 70 year old female, for several months she has had pain in her left shoulder, localized anteriorly and posteriorly at the joint line, worse with most motions including abduction, external rotation. She does get radiation of her pain to the elbow as is typical with a shoulder pain generator, nothing overtly radicular into the hand or fingertips. On exam she has pain with external rotation, abduction, minimally positive Neer's, Hawkins, empty can signs. Otherwise good rotator cuff strength, minimally positive speeds test, negative Yergason test. I do think she has glenohumeral osteoarthritis, we will increase her Tylenol to arthritis strength 2-3 times a day, adding left shoulder x-rays, [place] exercises. Return to see me in 3 to 4 weeks, we will probably do a glenohumeral joint injection if no better as I think this is her main pain generator.    ___________________________________________ Gwen Her. Dianah Field, M.D., ABFM., CAQSM. Primary Care and [place] Instructor of [place] of [facility]

## 2021-02-13 ENCOUNTER — Other Ambulatory Visit: Payer: Self-pay

## 2021-02-13 ENCOUNTER — Ambulatory Visit (INDEPENDENT_AMBULATORY_CARE_PROVIDER_SITE_OTHER): Payer: Medicare Other | Admitting: Sports Medicine

## 2021-02-13 ENCOUNTER — Ambulatory Visit (INDEPENDENT_AMBULATORY_CARE_PROVIDER_SITE_OTHER): Payer: Medicare Other

## 2021-02-13 DIAGNOSIS — M5412 Radiculopathy, cervical region: Secondary | ICD-10-CM

## 2021-02-13 DIAGNOSIS — M542 Cervicalgia: Secondary | ICD-10-CM | POA: Diagnosis not present

## 2021-02-13 MED ORDER — DEXAMETHASONE 4 MG PO TABS: 4.0000 mg | ORAL_TABLET | Freq: Three times a day (TID) | ORAL | 0 refills | Status: DC

## 2021-02-13 MED ORDER — GABAPENTIN 100 MG PO CAPS: ORAL_CAPSULE | ORAL | 11 refills | Status: DC

## 2021-02-13 NOTE — Assessment & Plan Note (Signed)
Jaclyn Bennett returns, she is a pleasant 70 year old female, I saw her about a month and a half ago for left-sided shoulder, forearm pain, we suspected a glenohumeral pain generator, over the last 6 weeks pain has declared itself is radicular, with radiation from the shoulder all the way down to the dorsum of the hand with electricity type sensations over the dorsal forearm, there are no discrete areas of tenderness to palpation, and her shoulder exam is for the most part unremarkable, no overt paresthesias into the hand. Adding Decadron, patient declines prednisone, Neurontin at night, discontinue Unisom and Flexeril. Cervical spine x-rays, formal physical therapy, return to see me in 6 weeks, MRI if no better.

## 2021-02-13 NOTE — Progress Notes (Signed)
    Procedures performed today:    None.  Independent interpretation of notes and tests performed by another provider:   None.  Brief History, Exam, Impression, and Recommendations:    Radiculitis of left cervical region Murl returns, she is a pleasant 70 year old female, I saw her about a month and a half ago for left-sided shoulder, forearm pain, we suspected a glenohumeral pain generator, over the last 6 weeks pain has declared itself is radicular, with radiation from the shoulder all the way down to the dorsum of the hand with electricity type sensations over the dorsal forearm, there are no discrete areas of tenderness to palpation, and her shoulder exam is for the most part unremarkable, no overt paresthesias into the hand. Adding Decadron, patient declines prednisone, Neurontin at night, discontinue Unisom and Flexeril. Cervical spine x-rays, formal physical therapy, return to see me in 6 weeks, MRI if no better.    ___________________________________________ Gwen Her. Dianah Field, M.D., ABFM., CAQSM. Primary Care and [place] Instructor of [place] of [facility]

## 2021-02-17 ENCOUNTER — Telehealth: Payer: Self-pay

## 2021-02-17 DIAGNOSIS — L821 Other seborrheic keratosis: Secondary | ICD-10-CM | POA: Diagnosis not present

## 2021-02-17 DIAGNOSIS — D1801 Hemangioma of skin and subcutaneous tissue: Secondary | ICD-10-CM | POA: Diagnosis not present

## 2021-02-17 DIAGNOSIS — D225 Melanocytic nevi of trunk: Secondary | ICD-10-CM | POA: Diagnosis not present

## 2021-02-17 NOTE — Telephone Encounter (Signed)
Noted, I do not have a concern for fracture so we will simply watch this for now.

## 2021-02-17 NOTE — Telephone Encounter (Signed)
Call Report: There is a possible linear lucency traversing the inferior endplate of C5. This may reflect summation artifact, degenerative changes or nondisplaced fracture. Consider further dedicated evaluation with CT cervical spine if clinically warranted

## 2021-02-20 ENCOUNTER — Telehealth: Payer: Self-pay

## 2021-02-20 NOTE — Telephone Encounter (Signed)
Patient called to report headaches and flushing with the medication. She stated that over all she just doesn't feel good. Per Dr. Darene Lamer, patient needs to complete the course of medication as prescribed. Patient aware and verbalized understanding.

## 2021-02-23 ENCOUNTER — Ambulatory Visit: Payer: Medicare Other | Admitting: Rehabilitative and Restorative Service Providers"

## 2021-02-23 ENCOUNTER — Other Ambulatory Visit: Payer: Self-pay

## 2021-02-23 ENCOUNTER — Encounter: Payer: Self-pay | Admitting: Rehabilitative and Restorative Service Providers"

## 2021-02-23 DIAGNOSIS — M79602 Pain in left arm: Secondary | ICD-10-CM | POA: Diagnosis not present

## 2021-02-23 DIAGNOSIS — M5412 Radiculopathy, cervical region: Secondary | ICD-10-CM | POA: Diagnosis not present

## 2021-02-23 DIAGNOSIS — R29898 Other symptoms and signs involving the musculoskeletal system: Secondary | ICD-10-CM

## 2021-02-23 DIAGNOSIS — R293 Abnormal posture: Secondary | ICD-10-CM
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
## 2021-02-23 NOTE — Patient Instructions (Signed)
Access Code: [email or web address] URL: [email or web address] Date: 02/23/2021 Prepared by: Gillermo Murdoch  Exercises Seated Cervical Retraction - 3 x daily - 7 x weekly - 1 sets - 5-10 reps - 3-5 hold Standing Scapular Retraction - 3 x daily - 7 x weekly - 10 reps - 1 sets - 10 hold Shoulder External Rotation and Scapular Retraction - 3 x daily - 7 x weekly - 10 reps - 1 sets - hold  Patient Education Biomedical scientist Office Posture TENS Unit Trigger Point Dry Needling

## 2021-02-27 ENCOUNTER — Ambulatory Visit: Payer: Medicare Other | Admitting: Rehabilitative and Restorative Service Providers"

## 2021-02-27 ENCOUNTER — Other Ambulatory Visit: Payer: Self-pay

## 2021-02-27 ENCOUNTER — Encounter: Payer: Self-pay | Admitting: Rehabilitative and Restorative Service Providers"

## 2021-02-27 DIAGNOSIS — M5412 Radiculopathy, cervical region: Secondary | ICD-10-CM

## 2021-02-27 DIAGNOSIS — M79602 Pain in left arm: Secondary | ICD-10-CM

## 2021-02-27 DIAGNOSIS — R29898 Other symptoms and signs involving the musculoskeletal system: Secondary | ICD-10-CM

## 2021-02-27 DIAGNOSIS — R293 Abnormal posture: Secondary | ICD-10-CM

## 2021-02-27 NOTE — Therapy (Signed)
[place], [place], [address] Phone: [phone]   Fax:  [phone]  Physical Therapy Treatment  Patient Details  Name: Jaclyn Bennett MRN: [id number] Date of Birth: 08-03-1950 Referring Provider (PT): Dr Dianah Field   Encounter Date: 02/27/2021   PT End of Session - 02/27/21 1408     Visit Number 2    Number of Visits 12    Date for PT Re-Evaluation 04/06/21    PT Start Time [id number]    PT Stop Time 1445    PT Time Calculation (min) 40 min    Activity Tolerance Patient tolerated treatment well             Past Medical History:  Diagnosis Date   Acute asthmatic bronchitis    Anxiety    Fibromyalgia    Herpes simplex type 2 infection 03/30/2016   Hypercholesterolemia    Hypothyroidism    Interstitial cystitis     Past Surgical History:  Procedure Laterality Date   LEFT HEART CATHETERIZATION WITH CORONARY ANGIOGRAM Bilateral 03/13/2014   Procedure: LEFT HEART CATHETERIZATION WITH CORONARY ANGIOGRAM;  Surgeon: Blane Ohara, MD;  Location: [facility] CATH LAB;  Service: Cardiovascular;  Laterality: Bilateral;   TOTAL ABDOMINAL HYSTERECTOMY  1997    There were no vitals filed for this visit.   Subjective Assessment - 02/27/21 1408     Subjective Pain in the Lt arm all the time. Does not think exercises are doing any good. Does not think the massage works - makes her sore after. Has an electrical device from her sister (H-Wave). Looked at device and assisted patient in trial in the clinic. Discussed options for therapy and whether patient will continue with PT.    Currently in Pain? Yes    Pain Score 3     Pain Location Arm    Pain Orientation Left    Pain Descriptors / Indicators Aching;Burning;Tingling    Pain Type Acute pain                               [facility] Adult PT Treatment/Exercise - 02/27/21 0001       Therapeutic Activites    Therapeutic Activities  Other Therapeutic Activities    Other Therapeutic Activities education re- use of laptop and avoiding placing her laptop on her lap      Neuro Re-ed    Neuro Re-ed Details  postural correction      Shoulder Exercises: Standing   Other Standing Exercises axial extension 10 sec x 5; scap squeeze 10 sec x 5; L's x 5 with foam roll      Moist Heat Therapy   Number Minutes Moist Heat 10 Minutes    Moist Heat Location Cervical;Elbow   Lt elbow     Electrical Stimulation   Electrical Stimulation Location Lt cervical and upper trap area    Electrical Stimulation Action TENS    Electrical Stimulation Parameters to tolerance    Electrical Stimulation Goals Pain;Tone      Manual Therapy   Manual therapy comments pt supine    Joint Mobilization PA mobs Grade I/II - not tolerated    Soft tissue mobilization through the posterior cervical and Lt upper trap/leveator - not tolerated    Myofascial Release Lt shoulder  PT Long Term Goals - 02/23/21 1457       PT LONG TERM GOAL #1   Title Decrease pain and tingling Lt UE by 75-100% allowing patient to return to all normal fuctional activities including    Time 6    Period Weeks    Status New    Target Date 04/06/21      PT LONG TERM GOAL #2   Title Increase cervical ROM in extension, Lt cervical rotation, Lt lateral flexion to WFL's    Time 6    Period Weeks    Status New    Target Date 04/06/21      PT LONG TERM GOAL #3   Title Improve posture and alignment with patient to demonstrate improved cervical alignmentwith posterior shoudler gridle engaged    Time 6    Period Weeks    Status New    Target Date 04/06/21      PT LONG TERM GOAL #4   Title Independent in HEP (including aquatic program as indicated)    Time 6    Period Weeks    Status New    Target Date 04/06/21      PT LONG TERM GOAL #5   Title Improve functional limitation score to 62    Time 6    Period Weeks    Status New     Target Date 04/06/21                   Plan - 02/27/21 1508     Clinical Impression Statement Patient returns reporting that the Lt UE pain is the same. She does not think the exercises helped at all. Wants to try massage - which she did not tolerate. Looked at the electrical device from her sister and attempted to help her use the H-Wave. Patient then decided she wanted to try the TENs unit which was applied with moist heat. Discussed position for use of laptop avoiding placing the laptop on her lap.    Rehab Potential Good    PT Frequency 2x / week    PT Duration 6 weeks    PT Treatment/Interventions ADLs/Self Care Home Management;Aquatic Therapy;Cryotherapy;Electrical Stimulation;Iontophoresis '4mg'$ /ml Dexamethasone;Moist Heat;Ultrasound;Therapeutic activities;Therapeutic exercise;Neuromuscular re-education;Patient/family education;Manual techniques;Dry needling;Taping    PT Next Visit Plan review HEP; trial of DN/manual work; postural correction; stretching; posterior shoulder girdle strengthening; modalities as indicated    PT Home Exercise Plan [email or web address]    Consulted and Agree with Plan of Care Patient             Patient will benefit from skilled therapeutic intervention in order to improve the following deficits and impairments:     Visit Diagnosis: Cervical radiculitis  Pain of left upper extremity  Other symptoms and signs involving the musculoskeletal system  Abnormal posture     Problem List Patient Active Problem List   Diagnosis Date Noted   Radiculitis of left cervical region 01/07/2021   Fatigue 08/03/2018   Hyperlipidemia LDL goal <100 10/31/2017   Pharyngitis 10/31/2017   Herpes simplex type 2 infection 03/30/2016   Congestive dilated cardiomyopathy ([place]) 10/22/2015   Nonspecific abnormal unspecified cardiovascular function study 02/26/2014   Screening for ischemic heart disease 12/23/2013   ALLERGIC RHINITIS 07/07/2009   LUMP OR MASS IN  BREAST 04/05/2008   Hypothyroidism 08/25/2007   HYPERCHOLESTEROLEMIA 08/25/2007   ANXIETY 08/01/2007   INTERSTITIAL CYSTITIS 08/01/2007   Fibromyalgia 08/01/2007    Arriah Wadle Nilda Simmer, PT, MPH  02/27/2021, 3:12 PM  [facility] [place] [address]  [place], [place], [address] Phone: [phone]   Fax:  [phone]  Name: Jaclyn Bennett MRN: [id number] Date of Birth: 02-19-1951

## 2021-03-02 ENCOUNTER — Encounter: Payer: Self-pay | Admitting: Rehabilitative and Restorative Service Providers"

## 2021-03-02 ENCOUNTER — Other Ambulatory Visit: Payer: Self-pay

## 2021-03-02 ENCOUNTER — Ambulatory Visit: Payer: Medicare Other | Admitting: Rehabilitative and Restorative Service Providers"

## 2021-03-02 ENCOUNTER — Other Ambulatory Visit: Payer: Self-pay | Admitting: Family Medicine

## 2021-03-02 DIAGNOSIS — R293 Abnormal posture: Secondary | ICD-10-CM | POA: Diagnosis not present

## 2021-03-02 DIAGNOSIS — R29898 Other symptoms and signs involving the musculoskeletal system: Secondary | ICD-10-CM

## 2021-03-02 DIAGNOSIS — M5412 Radiculopathy, cervical region: Secondary | ICD-10-CM

## 2021-03-02 DIAGNOSIS — M79602 Pain in left arm: Secondary | ICD-10-CM | POA: Diagnosis not present

## 2021-03-02 NOTE — Therapy (Addendum)
[address] [place], [place], [address] Phone: [phone]   Fax:  [phone]  Physical Therapy Treatment and Discharge Summary    PHYSICAL THERAPY DISCHARGE SUMMARY  Visits from Start of Care: 3  Current functional level related to goals / functional outcomes: See progress note    Remaining deficits: Unchanged    Education / Equipment: HEP    Patient agrees to discharge. Patient goals were not met. Patient is being discharged due to not returning since the last visit. Amaira Safley P. Helene Kelp PT, MPH 04/16/21 2:09 PM   Patient Details  Name: Jaclyn Bennett MRN: [id number] Date of Birth: 09/21/1950 Referring Provider (PT): Dr Dianah Field   Encounter Date: 03/02/2021   PT End of Session - 03/02/21 1433     Visit Number 3    Number of Visits 12    Date for PT Re-Evaluation 04/06/21    PT Start Time 1432    PT Stop Time 1500    PT Time Calculation (min) 28 min    Activity Tolerance Patient tolerated treatment well             Past Medical History:  Diagnosis Date   Acute asthmatic bronchitis    Anxiety    Fibromyalgia    Herpes simplex type 2 infection 03/30/2016   Hypercholesterolemia    Hypothyroidism    Interstitial cystitis     Past Surgical History:  Procedure Laterality Date   LEFT HEART CATHETERIZATION WITH CORONARY ANGIOGRAM Bilateral 03/13/2014   Procedure: LEFT HEART CATHETERIZATION WITH CORONARY ANGIOGRAM;  Surgeon: Blane Ohara, MD;  Location: [facility] CATH LAB;  Service: Cardiovascular;  Laterality: Bilateral;   TOTAL ABDOMINAL HYSTERECTOMY  1997    There were no vitals filed for this visit.       [facility] PT Assessment - 03/02/21 0001       Assessment   Medical Diagnosis Cervical radiculopathy    Referring Provider (PT) Dr Dianah Field    Onset Date/Surgical Date 11/12/20    Hand Dominance Right    Next MD Visit 03/27/21    Prior Therapy none      Sensation    Additional Comments tingling Lt UE to hand      Posture/Postural Control   Posture Comments head forward; shoulders rounded and elevated; scapulae abducted and rotated along the thoracic wall      AROM   Right/Left Shoulder --   tight end range elevation Lt > Rt   Cervical Flexion 62    Cervical Extension 35    Cervical - Right Side Bend 32    Cervical - Left Side Bend 35    Cervical - Right Rotation 64    Cervical - Left Rotation 54      Palpation   Spinal mobility hypomobility thoracic and cervical spine with PA mobs    Palpation comment muscular tightness in the Lt > Rt ant/lat/post cervical musculature; pecs; upper trap                           [facility] Adult PT Treatment/Exercise - 03/02/21 0001       Shoulder Exercises: Standing   Extension Strengthening;Both;10 reps;Theraband    Theraband Level (Shoulder Extension) Level 2 (Red)    Row Strengthening;Both;10 reps;Theraband    Theraband Level (Shoulder Row) Level 2 (Red)    Retraction Strengthening;Both;10 reps;Theraband    Theraband Level (Shoulder Retraction) Level 2 (Red)  Moist Heat Therapy   Number Minutes Moist Heat 10 Minutes    Moist Heat Location Cervical;Elbow   Lt elbow     Electrical Stimulation   Electrical Stimulation Location Lt cervical and upper trap area    Electrical Stimulation Action TENS    Electrical Stimulation Parameters to tolerance    Electrical Stimulation Goals Pain;Tone                     PT Education - 03/02/21 1455     Education Details HEP    Person(s) Educated Patient    Methods Explanation;Demonstration;Tactile cues;Verbal cues;Handout    Comprehension Verbalized understanding;Returned demonstration;Verbal cues required;Tactile cues required                 PT Long Term Goals - 02/23/21 1457       PT LONG TERM GOAL #1   Title Decrease pain and tingling Lt UE by 75-100% allowing patient to return to all normal fuctional activities  including    Time 6    Period Weeks    Status New    Target Date 04/06/21      PT LONG TERM GOAL #2   Title Increase cervical ROM in extension, Lt cervical rotation, Lt lateral flexion to WFL's    Time 6    Period Weeks    Status New    Target Date 04/06/21      PT LONG TERM GOAL #3   Title Improve posture and alignment with patient to demonstrate improved cervical alignmentwith posterior shoudler gridle engaged    Time 6    Period Weeks    Status New    Target Date 04/06/21      PT LONG TERM GOAL #4   Title Independent in HEP (including aquatic program as indicated)    Time 6    Period Weeks    Status New    Target Date 04/06/21      PT LONG TERM GOAL #5   Title Improve functional limitation score to 62    Time 6    Period Weeks    Status New    Target Date 04/06/21                   Plan - 03/02/21 1451     Clinical Impression Statement Patient returns reporting no change in Lt UE tingling and pain. She does not want to try additional treatment. We have tired postural correction; manual work; strengthening; TENS and heat. Patient does not want to try dry needling and does not feel that any of the things we have tried.    Rehab Potential Good    PT Frequency 2x / week    PT Duration 6 weeks    PT Treatment/Interventions ADLs/Self Care Home Management;Aquatic Therapy;Cryotherapy;Electrical Stimulation;Iontophoresis 5m/ml Dexamethasone;Moist Heat;Ultrasound;Therapeutic activities;Therapeutic exercise;Neuromuscular re-education;Patient/family education;Manual techniques;Dry needling;Taping    PT Next Visit Plan note to MD - hold therapy    PT Home Exercise Plan [email or web address]    Consulted and Agree with Plan of Care Patient             Patient will benefit from skilled therapeutic intervention in order to improve the following deficits and impairments:     Visit Diagnosis: Cervical radiculitis  Pain of left upper extremity  Other symptoms and signs  involving the musculoskeletal system  Abnormal posture     Problem List Patient Active Problem List   Diagnosis Date Noted   Radiculitis of left cervical  region 01/07/2021   Fatigue 08/03/2018   Hyperlipidemia LDL goal <100 10/31/2017   Pharyngitis 10/31/2017   Herpes simplex type 2 infection 03/30/2016   Congestive dilated cardiomyopathy ([place]) 10/22/2015   Nonspecific abnormal unspecified cardiovascular function study 02/26/2014   Screening for ischemic heart disease 12/23/2013   ALLERGIC RHINITIS 07/07/2009   LUMP OR MASS IN BREAST 04/05/2008   Hypothyroidism 08/25/2007   HYPERCHOLESTEROLEMIA 08/25/2007   ANXIETY 08/01/2007   INTERSTITIAL CYSTITIS 08/01/2007   Fibromyalgia 08/01/2007    Juanjesus Pepperman Nilda Simmer, PT, MPH  03/02/2021, 2:57 PM  [facility] [place], [place], [address] Phone: [phone]   Fax:  [phone]  Name: Victoriah Wilds MRN: [id number] Date of Birth: 11-19-1950

## 2021-03-02 NOTE — Patient Instructions (Signed)
Access Code: [email or web address] URL: [email or web address] Date: 03/02/2021 Prepared by: Gillermo Murdoch  Exercises Seated Cervical Retraction - 3 x daily - 7 x weekly - 1 sets - 5-10 reps - 3-5 hold Standing Scapular Retraction - 3 x daily - 7 x weekly - 10 reps - 1 sets - 10 hold Shoulder External Rotation and Scapular Retraction - 3 x daily - 7 x weekly - 10 reps - 1 sets - hold Shoulder External Rotation and Scapular Retraction with Resistance - 2 x daily - 7 x weekly - 10 reps - 3 sets Scapular Retraction with Resistance - 2 x daily - 7 x weekly - 10 reps - 3 sets Scapular Retraction with Resistance Advanced - 2 x daily - 7 x weekly - 10 reps - 3 sets

## 2021-03-05 ENCOUNTER — Encounter: Payer: Medicare Other | Admitting: Rehabilitative and Restorative Service Providers"

## 2021-03-09 ENCOUNTER — Encounter: Payer: Medicare Other | Admitting: Rehabilitative and Restorative Service Providers"

## 2021-03-17 ENCOUNTER — Other Ambulatory Visit: Payer: Self-pay

## 2021-03-17 ENCOUNTER — Ambulatory Visit (INDEPENDENT_AMBULATORY_CARE_PROVIDER_SITE_OTHER): Payer: Medicare Other | Admitting: Sports Medicine

## 2021-03-17 DIAGNOSIS — M5412 Radiculopathy, cervical region: Secondary | ICD-10-CM | POA: Diagnosis not present

## 2021-03-17 NOTE — Progress Notes (Signed)
    Procedures performed today:    None.  Independent interpretation of notes and tests performed by another provider:   None.  Brief History, Exam, Impression, and Recommendations:    Radiculitis of left cervical region Jaclyn Bennett returns, she is a pleasant 70 year old female, we have been treating her for left-sided cervical radiculitis, she has not responded well to gabapentin, Unisom, Flexeril, prednisone, Decadron. We will avoid medications, she did not respond well to physical therapy either. She is not interested in MRIs or epidural injections. I will print out some home conditioning, she can do this herself, work on posture, return to see me as needed.    ___________________________________________ Gwen Her. Dianah Field, M.D., ABFM., CAQSM. Primary Care and [place] Instructor of [employer] [facility] [employer] [facility]

## 2021-03-17 NOTE — Assessment & Plan Note (Addendum)
Jaclyn Bennett returns, she is a pleasant 70 year old female, we have been treating her for left-sided cervical radiculitis, she has not responded well to gabapentin, Unisom, Flexeril, prednisone, Decadron. We will avoid medications, she did not respond well to physical therapy either. She is not interested in MRIs or epidural injections. I will print out some home conditioning, she can do this herself, work on posture, return to see me as needed.

## 2021-03-27 ENCOUNTER — Ambulatory Visit: Payer: Medicare Other | Admitting: Sports Medicine

## 2021-04-03 ENCOUNTER — Other Ambulatory Visit: Payer: Self-pay

## 2021-04-03 ENCOUNTER — Ambulatory Visit
Admission: RE | Admit: 2021-04-03 | Discharge: 2021-04-03 | Disposition: A | Discharge status: Home / Self Care | Payer: Medicare Other | Source: Ambulatory Visit | Attending: Obstetrics and Gynecology | Admitting: Obstetrics and Gynecology

## 2021-04-03 DIAGNOSIS — Z780 Asymptomatic menopausal state: Secondary | ICD-10-CM | POA: Diagnosis not present

## 2021-04-03 DIAGNOSIS — M85852 Other specified disorders of bone density and structure, left thigh: Secondary | ICD-10-CM | POA: Diagnosis not present

## 2021-04-03 DIAGNOSIS — Z1231 Encounter for screening mammogram for malignant neoplasm of breast: Secondary | ICD-10-CM

## 2021-04-03 DIAGNOSIS — E2839 Other primary ovarian failure: Secondary | ICD-10-CM

## 2021-04-14 ENCOUNTER — Ambulatory Visit (INDEPENDENT_AMBULATORY_CARE_PROVIDER_SITE_OTHER): Payer: Medicare Other

## 2021-04-14 VITALS — Ht 63.0 in | Wt 116.0 lb

## 2021-04-14 DIAGNOSIS — Z0000 Encounter for general adult medical examination without abnormal findings: Secondary | ICD-10-CM

## 2021-04-14 NOTE — Patient Instructions (Signed)
Jaclyn Bennett , Thank you for taking time to complete your Medicare Wellness Visit. I appreciate your ongoing commitment to your health goals. Please review the following plan we discussed and let me know if I can assist you in the future.   Screening recommendations/referrals: Colonoscopy: Declined  Mammogram: Completed 04/03/2021-Due 04/03/2022 Bone Density: Completed 04/03/2021-Due 04/03/2022 Recommended yearly ophthalmology/optometry visit for glaucoma screening and checkup Recommended yearly dental visit for hygiene and checkup  Vaccinations: Influenza vaccine: Declined Pneumococcal vaccine: Due-May obtain vaccine at our office or your local pharmacy. Tdap vaccine: Discuss with pharmacy Shingles vaccine: Discuss with pharmacy   Covid-19:Declined  Advanced directives: Please bring a copy of Living Will and/or Healthcare Power of Attorney for your chart.   Conditions/risks identified: See problem list  Next appointment: Follow up in one year for your annual wellness visit    Preventive Care 65 Years and Older, Female Preventive care refers to lifestyle choices and visits with your health care provider that can promote health and wellness. What does preventive care include? A yearly physical exam. This is also called an annual well check. Dental exams once or twice a year. Routine eye exams. Ask your health care provider how often you should have your eyes checked. Personal lifestyle choices, including: Daily care of your teeth and gums. Regular physical activity. Eating a healthy diet. Avoiding tobacco and drug use. Limiting alcohol use. Practicing safe sex. Taking low-dose aspirin every day. Taking vitamin and mineral supplements as recommended by your health care provider. What happens during an annual well check? The services and screenings done by your health care provider during your annual well check will depend on your age, overall health, lifestyle risk factors, and  family history of disease. Counseling  Your health care provider may ask you questions about your: Alcohol use. Tobacco use. Drug use. Emotional well-being. Home and relationship well-being. Sexual activity. Eating habits. History of falls. Memory and ability to understand (cognition). Work and work Statistician. Reproductive health. Screening  You may have the following tests or measurements: Height, weight, and BMI. Blood pressure. Lipid and cholesterol levels. These may be checked every 5 years, or more frequently if you are over 83 years old. Skin check. Lung cancer screening. You may have this screening every year starting at age 40 if you have a 30-pack-year history of smoking and currently smoke or have quit within the past 15 years. Fecal occult blood test (FOBT) of the stool. You may have this test every year starting at age 59. Flexible sigmoidoscopy or colonoscopy. You may have a sigmoidoscopy every 5 years or a colonoscopy every 10 years starting at age 52. Hepatitis C blood test. Hepatitis B blood test. Sexually transmitted disease (STD) testing. Diabetes screening. This is done by checking your blood sugar (glucose) after you have not eaten for a while (fasting). You may have this done every 1-3 years. Bone density scan. This is done to screen for osteoporosis. You may have this done starting at age 14. Mammogram. This may be done every 1-2 years. Talk to your health care provider about how often you should have regular mammograms. Talk with your health care provider about your test results, treatment options, and if necessary, the need for more tests. Vaccines  Your health care provider may recommend certain vaccines, such as: Influenza vaccine. This is recommended every year. Tetanus, diphtheria, and acellular pertussis (Tdap, Td) vaccine. You may need a Td booster every 10 years. Zoster vaccine. You may need this after age 77. Pneumococcal  13-valent conjugate  (PCV13) vaccine. One dose is recommended after age 61. Pneumococcal polysaccharide (PPSV23) vaccine. One dose is recommended after age 4. Talk to your health care provider about which screenings and vaccines you need and how often you need them. This information is not intended to replace advice given to you by your health care provider. Make sure you discuss any questions you have with your health care provider. Document Released: 06/27/2015 Document Revised: 02/18/2016 Document Reviewed: 04/01/2015 Elsevier Interactive Patient Education  2017 [place] Prevention in the Home Falls can cause injuries. They can happen to people of all ages. There are many things you can do to make your home safe and to help prevent falls. What can I do on the outside of my home? Regularly fix the edges of walkways and driveways and fix any cracks. Remove anything that might make you trip as you walk through a door, such as a raised step or threshold. Trim any bushes or trees on the path to your home. Use bright outdoor lighting. Clear any walking paths of anything that might make someone trip, such as rocks or tools. Regularly check to see if handrails are loose or broken. Make sure that both sides of any steps have handrails. Any raised decks and porches should have guardrails on the edges. Have any leaves, snow, or ice cleared regularly. Use sand or salt on walking paths during winter. Clean up any spills in your garage right away. This includes oil or grease spills. What can I do in the bathroom? Use night lights. Install grab bars by the toilet and in the tub and shower. Do not use towel bars as grab bars. Use non-skid mats or decals in the tub or shower. If you need to sit down in the shower, use a plastic, non-slip stool. Keep the floor dry. Clean up any water that spills on the floor as soon as it happens. Remove soap buildup in the tub or shower regularly. Attach bath mats securely with  double-sided non-slip rug tape. Do not have throw rugs and other things on the floor that can make you trip. What can I do in the bedroom? Use night lights. Make sure that you have a light by your bed that is easy to reach. Do not use any sheets or blankets that are too big for your bed. They should not hang down onto the floor. Have a firm chair that has side arms. You can use this for support while you get dressed. Do not have throw rugs and other things on the floor that can make you trip. What can I do in the kitchen? Clean up any spills right away. Avoid walking on wet floors. Keep items that you use a lot in easy-to-reach places. If you need to reach something above you, use a strong step stool that has a grab bar. Keep electrical cords out of the way. Do not use floor polish or wax that makes floors slippery. If you must use wax, use non-skid floor wax. Do not have throw rugs and other things on the floor that can make you trip. What can I do with my stairs? Do not leave any items on the stairs. Make sure that there are handrails on both sides of the stairs and use them. Fix handrails that are broken or loose. Make sure that handrails are as long as the stairways. Check any carpeting to make sure that it is firmly attached to the stairs. Fix any carpet  that is loose or worn. Avoid having throw rugs at the top or bottom of the stairs. If you do have throw rugs, attach them to the floor with carpet tape. Make sure that you have a light switch at the top of the stairs and the bottom of the stairs. If you do not have them, ask someone to add them for you. What else can I do to help prevent falls? Wear shoes that: Do not have high heels. Have rubber bottoms. Are comfortable and fit you well. Are closed at the toe. Do not wear sandals. If you use a stepladder: Make sure that it is fully opened. Do not climb a closed stepladder. Make sure that both sides of the stepladder are locked  into place. Ask someone to hold it for you, if possible. Clearly mark and make sure that you can see: Any grab bars or handrails. First and last steps. Where the edge of each step is. Use tools that help you move around (mobility aids) if they are needed. These include: Canes. Walkers. Scooters. Crutches. Turn on the lights when you go into a dark area. Replace any light bulbs as soon as they burn out. Set up your furniture so you have a clear path. Avoid moving your furniture around. If any of your floors are uneven, fix them. If there are any pets around you, be aware of where they are. Review your medicines with your doctor. Some medicines can make you feel dizzy. This can increase your chance of falling. Ask your doctor what other things that you can do to help prevent falls. This information is not intended to replace advice given to you by your health care provider. Make sure you discuss any questions you have with your health care provider. Document Released: 03/27/2009 Document Revised: 11/06/2015 Document Reviewed: 07/05/2014 Elsevier Interactive Patient Education  2017 [employer].

## 2021-04-14 NOTE — Progress Notes (Signed)
Subjective:   Jaclyn Bennett is a 70 y.o. female who presents for Medicare Annual (Subsequent) preventive examination.  I connected with Jaclyn Bennett today by telephone and verified that I am speaking with the correct person using two identifiers. Location patient: home Location provider: work Persons participating in the virtual visit: patient, Marine scientist.    I discussed the limitations, risks, security and privacy concerns of performing an evaluation and management service by telephone and the availability of in person appointments. I also discussed with the patient that there may be a patient responsible charge related to this service. The patient expressed understanding and verbally consented to this telephonic visit.    Interactive audio and video telecommunications were attempted between this provider and patient, however failed, due to patient having technical difficulties OR patient did not have access to video capability.  We continued and completed visit with audio only.  Some vital signs may be absent or patient reported.   Time Spent with patient on telephone encounter: 20 minutes   Review of Systems     Cardiac Risk Factors include: advanced age (>38men, >58 women);dyslipidemia     Objective:    Today's Vitals   04/14/21 1340  Weight: 116 lb (52.6 kg)  Height: 5\' 3"  (1.6 m)   Body mass index is 20.55 kg/m.  Advanced Directives 04/14/2021 04/04/2020 05/06/2019 02/15/2017  Does Patient Have a Medical Advance Directive? Yes Yes No Yes  Type of Paramedic of [place];Living will - - Living will;Healthcare Power of [place] in Chart? No - copy requested - - No - copy requested  Would patient like information on creating a medical advance directive? - - No - Patient declined -    Current Medications (verified) Outpatient Encounter Medications as of 04/14/2021  Medication Sig   aspirin 81 MG tablet Take 81 mg by  mouth daily.   atorvastatin (LIPITOR) 20 MG tablet TAKE 1/2 TABLET BY MOUTH EVERY DAY   Cholecalciferol (VITAMIN D) 1000 UNITS capsule Take 5,000 Units by mouth daily.   Coconut Oil 1000 MG CAPS Take 1,000 mg by mouth daily.    Coenzyme Q10 (COQ10) 100 MG CAPS Take 1 capsule by mouth daily.   COLLAGEN PO Take 1 capsule by mouth daily.   estradiol (ESTRACE) 0.5 MG tablet Take 0.25 mg by mouth every other day.    famciclovir (FAMVIR) 250 MG tablet TAKE 1/2 TABLET BY MOUTH DAILY   levothyroxine (SYNTHROID) 75 MCG tablet Take 1 tablet (75 mcg total) by mouth daily before breakfast.   lisinopril (ZESTRIL) 2.5 MG tablet TAKE ONE (1) TABLET BY MOUTH EVERY DAY   Multiple Vitamins-Minerals (ZINC PO) Take by mouth daily.   NON FORMULARY Take 2 tablets by mouth daily. Bone-Up: Calcium Supplement   vitamin C (ASCORBIC ACID) 500 MG tablet Take 1,000 mg by mouth daily.   vitamin E 400 UNIT capsule Take 400 Units by mouth daily.   [DISCONTINUED] acetaminophen (TYLENOL) 650 MG CR tablet Take 1 tablet (650 mg total) by mouth every 8 (eight) hours as needed for pain.   No facility-administered encounter medications on file as of 04/14/2021.    Allergies (verified) Iodine; Valtrex [valacyclovir hcl]; Anesthetics, amide; Benadryl allergy [diphenhydramine hcl (sleep)]; and Sulfonamide derivatives   History: Past Medical History:  Diagnosis Date   Acute asthmatic bronchitis    Anxiety    Fibromyalgia    Herpes simplex type 2 infection 03/30/2016   Hypercholesterolemia    Hypothyroidism  Interstitial cystitis    Past Surgical History:  Procedure Laterality Date   LEFT HEART CATHETERIZATION WITH CORONARY ANGIOGRAM Bilateral 03/13/2014   Procedure: LEFT HEART CATHETERIZATION WITH CORONARY ANGIOGRAM;  Surgeon: Blane Ohara, MD;  Location: [facility] CATH LAB;  Service: Cardiovascular;  Laterality: Bilateral;   TOTAL ABDOMINAL HYSTERECTOMY  1997   Family History  Problem Relation Age of Onset   Other  Mother        bronchiectasis   Pneumonia Mother    Heart failure Mother    Alzheimer's disease Father    Stroke Father    Heart failure Father    Pancreatic cancer Brother    Hyperlipidemia Sister    Other Sister        bronchiectasis   Social History   Socioeconomic History   Marital status: Divorced    Spouse name: Not on file   Number of children: 3   Years of education: Not on file   Highest education level: Not on file  Occupational History   Occupation: school teacher - substitute teaching now  Tobacco Use   Smoking status: Never   Smokeless tobacco: Never  Vaping Use   Vaping Use: Never used  Substance and Sexual Activity   Alcohol use: Not Currently    Comment: Rare   Drug use: No   Sexual activity: Not on file  Other Topics Concern   Not on file  Social History Narrative   Plays piano, 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   Declines flu and pneumonia shot 04-30-10   Social Determinants of Health   Financial Resource Strain: Low Risk    Difficulty of Paying Living Expenses: Not hard at all  Food Insecurity: No Food Insecurity   Worried About Charity fundraiser in the Last Year: Never true   [place] in the Last Year: Never true  Transportation Needs: No Transportation Needs   Lack of Transportation (Medical): No   Lack of Transportation (Non-Medical): No  Physical Activity: Insufficiently Active   Days of Exercise per Week: 7 days   Minutes of Exercise per Session: 20 min  Stress: No Stress Concern Present   Feeling of Stress : Not at all  Social Connections: Moderately Integrated   Frequency of Communication with Friends and Family: More than three times a week   Frequency of Social Gatherings with Friends and Family: More than three times a week   Attends Religious Services: More than 4 times per year   Active Member of [employer] or Organizations: Yes   Attends Music therapist: More than 4 times per year   Marital Status: Divorced    Tobacco  Counseling Counseling given: Not Answered   Clinical Intake:  Pre-visit preparation completed: Yes  Pain : No/denies pain     BMI - recorded: 20.55 Nutritional Status: BMI of 19-24  Normal Nutritional Risks: None Diabetes: No  How often do you need to have someone help you when you read instructions, pamphlets, or other written materials from your doctor or pharmacy?: 1 - Never  Diabetic?No  Interpreter Needed?: No  Information entered by :: Caroleen Hamman LPN   Activities of Daily Living In your present state of health, do you have any difficulty performing the following activities: 04/14/2021 01/06/2021  Hearing? N N  Vision? N N  Difficulty concentrating or making decisions? N N  Walking or climbing stairs? N N  Dressing or bathing? N N  Doing errands, shopping? N N  Conservation officer, nature and  eating ? N -  Using the Toilet? N -  In the past six months, have you accidently leaked urine? N -  Do you have problems with loss of bowel control? N -  Managing your Medications? N -  Managing your Finances? N -  Housekeeping or managing your Housekeeping? N -  Some recent data might be hidden    Patient Care Team: Carollee Herter, Alferd Apa, DO as PCP - General (Family Medicine) Stanford Breed Denice Bors, MD as PCP - Cardiology (Cardiology) Noralee Space, MD as Consulting Physician (Pulmonary Disease) Lelon Perla, MD as Consulting Physician (Cardiology) Paula Compton, MD as Consulting Physician (Obstetrics and Gynecology) Bjorn Loser, MD as Consulting Physician (Urology)  Indicate any recent Medical Services you may have received from other than [facility] providers in the past year (date may be approximate).     Assessment:   This is a routine wellness examination for [facility].  Hearing/Vision screen Hearing Screening - Comments:: No issues Vision Screening - Comments:: Wears glasses Last eye exam-07/2020-Dr. DeAllan  Dietary issues and exercise activities  discussed: Current Exercise Habits: Home exercise routine, Type of exercise: walking, Time (Minutes): 25, Frequency (Times/Week): 7, Weekly Exercise (Minutes/Week): 175, Exercise limited by: None identified   Goals Addressed               This Visit's Progress     Patient Stated     patient (pt-stated)   On track     Maintain current health by staying active, sleeping more.        Depression Screen PHQ 2/9 Scores 04/14/2021 09/23/2020 08/03/2018 02/15/2017 02/15/2017 08/09/2016 08/04/2015  PHQ - 2 Score 0 0 0 0 0 0 0  PHQ- 9 Score - - 1 - 0 - -    Fall Risk Fall Risk  04/14/2021 09/23/2020 02/15/2017 08/09/2016 08/04/2015  Falls in the past year? 0 0 No No No  Number falls in past yr: 0 0 - - -  Injury with Fall? 0 0 - - -  Risk for fall due to : - - - - -  Follow up Falls prevention discussed - - - -    FALL RISK PREVENTION PERTAINING TO THE HOME:  Any stairs in or around the home? Yes  If so, are there any without handrails? No  Home free of loose throw rugs in walkways, pet beds, electrical cords, etc? Yes  Adequate lighting in your home to reduce risk of falls? Yes   ASSISTIVE DEVICES UTILIZED TO PREVENT FALLS:  Life alert? No  Use of a cane, walker or w/c? No  Grab bars in the bathroom? No  Shower chair or bench in shower? Yes  Elevated toilet seat or a handicapped toilet? No   TIMED UP AND GO:  Was the test performed? No . Phone visit   Cognitive Function:Normal cognitive status assessed by  this Nurse Health Advisor. No abnormalities found.          Immunizations Immunization History  Administered Date(s) Administered   Tdap 12/03/2010    TDAP status: Due, Education has been provided regarding the importance of this vaccine. Advised may receive this vaccine at local pharmacy or Health Dept. Aware to provide a copy of the vaccination record if obtained from local pharmacy or Health Dept. Verbalized acceptance and understanding.  Flu Vaccine status: Declined,  Education has been provided regarding the importance of this vaccine but patient still declined. Advised may receive this vaccine at local pharmacy or Health Dept. Aware to  provide a copy of the vaccination record if obtained from local pharmacy or Health Dept. Verbalized acceptance and understanding.  Pneumococcal vaccine status: Due, Education has been provided regarding the importance of this vaccine. Advised may receive this vaccine at local pharmacy or Health Dept. Aware to provide a copy of the vaccination record if obtained from local pharmacy or Health Dept. Verbalized acceptance and understanding.  Covid-19 vaccine status: Declined, Education has been provided regarding the importance of this vaccine but patient still declined. Advised may receive this vaccine at local pharmacy or Health Dept.or vaccine clinic. Aware to provide a copy of the vaccination record if obtained from local pharmacy or Health Dept. Verbalized acceptance and understanding.  Qualifies for Shingles Vaccine? Yes   Zostavax completed No   Shingrix Completed?: No.    Education has been provided regarding the importance of this vaccine. Patient has been advised to call insurance company to determine out of pocket expense if they have not yet received this vaccine. Advised may also receive vaccine at local pharmacy or Health Dept. Verbalized acceptance and understanding.  Screening Tests Health Maintenance  Topic Date Due   Pneumonia Vaccine 51+ Years old (1 - PCV) Never done   Hepatitis C Screening  Never done   Zoster Vaccines- Shingrix (1 of 2) Never done   TETANUS/TDAP  12/02/2020   COVID-19 Vaccine (1) 04/30/2021 (Originally 01/15/1951)   INFLUENZA VACCINE  09/11/2021 (Originally 01/12/2021)   COLONOSCOPY (Pts 45-46yrs Insurance coverage will need to be confirmed)  09/23/2021 (Originally 06/14/2020)   MAMMOGRAM  04/04/2023   DEXA SCAN  04/04/2023   HPV VACCINES  Aged Out    Health Maintenance  Health Maintenance  Due  Topic Date Due   Pneumonia Vaccine 86+ Years old (1 - PCV) Never done   Hepatitis C Screening  Never done   Zoster Vaccines- Shingrix (1 of 2) Never done   TETANUS/TDAP  12/02/2020    Colorectal cancer screening: Due-Declined  Mammogram status: Completed bilateral 04/03/2021. Repeat every year  Bone Density status: Completed 04/03/2021. Results reflect: Bone density results: OSTEOPENIA. Repeat every 2 years.  Lung Cancer Screening: (Low Dose CT Chest recommended if Age 31-80 years, 30 pack-year currently smoking OR have quit w/in 15years.) does not qualify.     Additional Screening:  Hepatitis C Screening: does qualify; Patient to discuss with PCP at next office visit  Vision Screening: Recommended annual ophthalmology exams for early detection of glaucoma and other disorders of the eye. Is the patient up to date with their annual eye exam?  Yes  Who is the provider or what is the name of the office in which the patient attends annual eye exams? Dr. Isac Caddy   Dental Screening: Recommended annual dental exams for proper oral hygiene  Community Resource Referral / Chronic Care Management: CRR required this visit?  No   CCM required this visit?  No      Plan:     I have personally reviewed and noted the following in the patient's chart:   Medical and social history Use of alcohol, tobacco or illicit drugs  Current medications and supplements including opioid prescriptions.  Functional ability and status Nutritional status Physical activity Advanced directives List of other physicians Hospitalizations, surgeries, and ER visits in previous 12 months Vitals Screenings to include cognitive, depression, and falls Referrals and appointments  In addition, I have reviewed and discussed with patient certain preventive protocols, quality metrics, and best practice recommendations. A written personalized care plan for preventive services as  well as general preventive  health recommendations were provided to patient.   Due to this being a telephonic visit, the after visit summary with patients personalized plan was offered to patient via mail or my-chart. Patient would like to access on my-chart.   Marta Antu, LPN   [id number]/10/516  Nurse Health Advisor  Nurse Notes: None

## 2021-04-20 DIAGNOSIS — H2513 Age-related nuclear cataract, bilateral: Secondary | ICD-10-CM | POA: Diagnosis not present

## 2021-04-21 ENCOUNTER — Other Ambulatory Visit: Payer: Self-pay | Admitting: Family Medicine

## 2021-04-21 DIAGNOSIS — E039 Hypothyroidism, unspecified: Secondary | ICD-10-CM

## 2021-05-04 NOTE — Progress Notes (Signed)
HPI: FU cardiomyopathy; previously seen for atypical CP; stress echo 9/15 showed baseline EF 45; inferior, septal and apical HK both at rest and with stress. Cardiac cath 9/15 showed normal coronary arteries. Echocardiogram May 2022 showed normal LV function, grade 1 diastolic dysfunction, aneurysmal atrial septum with possible small PFO.  Since last seen, the patient has dyspnea with more extreme activities but not with routine activities. It is relieved with rest. It is not associated with chest pain. There is no orthopnea, PND or pedal edema. There is no syncope or palpitations. There is no exertional chest pain.   Current Outpatient Medications  Medication Sig Dispense Refill   aspirin 81 MG tablet Take 81 mg by mouth daily.     atorvastatin (LIPITOR) 20 MG tablet TAKE 1/2 TABLET BY MOUTH EVERY DAY 45 tablet 0   Cholecalciferol (VITAMIN D) 1000 UNITS capsule Take 5,000 Units by mouth daily.     Coconut Oil 1000 MG CAPS Take 1,000 mg by mouth daily.      Coenzyme Q10 (COQ10) 100 MG CAPS Take 1 capsule by mouth daily.     COLLAGEN PO Take 1 capsule by mouth daily.     estradiol (ESTRACE) 0.5 MG tablet Take 0.25 mg by mouth every other day.      famciclovir (FAMVIR) 250 MG tablet TAKE 1/2 TABLET BY MOUTH DAILY 15 tablet 5   levothyroxine (SYNTHROID) 75 MCG tablet TAKE ONE TABLET BY MOUTH EVERY MORNING BEFORE BREAKFAST 90 tablet 1   lisinopril (ZESTRIL) 2.5 MG tablet TAKE ONE (1) TABLET BY MOUTH EVERY DAY 90 tablet 3   Multiple Vitamins-Minerals (ZINC PO) Take by mouth daily.     NON FORMULARY Take 2 tablets by mouth daily. Bone-Up: Calcium Supplement     vitamin C (ASCORBIC ACID) 500 MG tablet Take 1,000 mg by mouth daily.     vitamin E 400 UNIT capsule Take 400 Units by mouth daily.     No current facility-administered medications for this visit.     Past Medical History:  Diagnosis Date   Acute asthmatic bronchitis    Anxiety    Fibromyalgia    Herpes simplex type 2  infection 03/30/2016   Hypercholesterolemia    Hypothyroidism    Interstitial cystitis     Past Surgical History:  Procedure Laterality Date   LEFT HEART CATHETERIZATION WITH CORONARY ANGIOGRAM Bilateral 03/13/2014   Procedure: LEFT HEART CATHETERIZATION WITH CORONARY ANGIOGRAM;  Surgeon: Blane Ohara, MD;  Location: [facility] CATH LAB;  Service: Cardiovascular;  Laterality: Bilateral;   TOTAL ABDOMINAL HYSTERECTOMY  1997    Social History   Socioeconomic History   Marital status: Divorced    Spouse name: Not on file   Number of children: 3   Years of education: Not on file   Highest education level: Not on file  Occupational History   Occupation: school teacher - substitute teaching now  Tobacco Use   Smoking status: Never   Smokeless tobacco: Never  Vaping Use   Vaping Use: Never used  Substance and Sexual Activity   Alcohol use: Not Currently    Comment: Rare   Drug use: No   Sexual activity: Not on file  Other Topics Concern   Not on file  Social History Narrative   Plays piano, 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   Declines flu and pneumonia shot 04-30-10   Social Determinants of Health   Financial Resource Strain: Low Risk    Difficulty of Paying Living Expenses: Not  hard at all  Food Insecurity: No Food Insecurity   Worried About Charity fundraiser in the Last Year: Never true   Ran Out of Food in the Last Year: Never true  Transportation Needs: No Transportation Needs   Lack of Transportation (Medical): No   Lack of Transportation (Non-Medical): No  Physical Activity: Insufficiently Active   Days of Exercise per Week: 7 days   Minutes of Exercise per Session: 20 min  Stress: No Stress Concern Present   Feeling of Stress : Not at all  Social Connections: Moderately Integrated   Frequency of Communication with Friends and Family: More than three times a week   Frequency of Social Gatherings with Friends and Family: More than three times a week   Attends Religious  Services: More than 4 times per year   Active Member of [employer] or Organizations: Yes   Attends Music therapist: More than 4 times per year   Marital Status: Divorced  Human resources officer Violence: Not At Risk   Fear of Current or Ex-Partner: No   Emotionally Abused: No   Physically Abused: No   Sexually Abused: No    Family History  Problem Relation Age of Onset   Other Mother        bronchiectasis   Pneumonia Mother    Heart failure Mother    Alzheimer's disease Father    Stroke Father    Heart failure Father    Pancreatic cancer Brother    Hyperlipidemia Sister    Other Sister        bronchiectasis    ROS: no fevers or chills, productive cough, hemoptysis, dysphasia, odynophagia, melena, hematochezia, dysuria, hematuria, rash, seizure activity, orthopnea, PND, pedal edema, claudication. Remaining systems are negative.  Physical Exam: Well-developed well-nourished in no acute distress.  Skin is warm and dry.  HEENT is normal.  Neck is supple.  Chest is clear to auscultation with normal expansion.  Cardiovascular exam is regular rate and rhythm.  Abdominal exam nontender or distended. No masses palpated. Extremities show no edema. neuro grossly intact   A/P  1 nonischemic cardiomyopathy-LV function has normalized on most recent echocardiogram.  Continue ACE inhibitor.  Beta-blocker discontinued previously secondary to fatigue.  2 hyperlipidemia-continue statin.  Check lipids and liver.  Kirk Ruths, MD

## 2021-05-13 ENCOUNTER — Encounter: Payer: Self-pay | Admitting: Cardiology

## 2021-05-13 ENCOUNTER — Ambulatory Visit: Payer: Medicare Other | Admitting: Cardiology

## 2021-05-13 ENCOUNTER — Other Ambulatory Visit: Payer: Self-pay

## 2021-05-13 VITALS — BP 112/68 | HR 86 | Ht 63.0 in | Wt 118.0 lb

## 2021-05-13 DIAGNOSIS — I420 Dilated cardiomyopathy: Secondary | ICD-10-CM | POA: Diagnosis not present

## 2021-05-13 DIAGNOSIS — E7800 Pure hypercholesterolemia, unspecified: Secondary | ICD-10-CM | POA: Diagnosis not present

## 2021-05-13 NOTE — Patient Instructions (Signed)

## 2021-05-19 DIAGNOSIS — E7800 Pure hypercholesterolemia, unspecified: Secondary | ICD-10-CM | POA: Diagnosis not present

## 2021-05-20 LAB — HEPATIC FUNCTION PANEL
ALT: 22 IU/L (ref 0–32)
AST: 24 IU/L (ref 0–40)
Albumin: 4.3 g/dL (ref 3.8–4.8)
Alkaline Phosphatase: 104 IU/L (ref 44–121)
Bilirubin Total: 0.4 mg/dL (ref 0.0–1.2)
Bilirubin, Direct: <0.1 mg/dL (ref 0.00–0.40)
Total Protein: 6.4 g/dL (ref 6.0–8.5)

## 2021-05-20 LAB — LIPID PANEL
Chol/HDL Ratio: 4.2 ratio (ref 0.0–4.4)
Cholesterol, Total: 191 mg/dL (ref 100–199)
HDL: 45 mg/dL (ref >39)
LDL Chol Calc (NIH): 115 mg/dL — ABNORMAL HIGH (ref 0–99)
Triglycerides: 175 mg/dL — ABNORMAL HIGH (ref 0–149)
VLDL Cholesterol Cal: 31 mg/dL (ref 5–40)

## 2021-07-13 ENCOUNTER — Other Ambulatory Visit: Payer: Self-pay | Admitting: Family Medicine

## 2021-07-13 DIAGNOSIS — E785 Hyperlipidemia, unspecified: Secondary | ICD-10-CM

## 2021-09-24 DIAGNOSIS — H2513 Age-related nuclear cataract, bilateral: Secondary | ICD-10-CM | POA: Diagnosis not present

## 2021-10-14 ENCOUNTER — Other Ambulatory Visit: Payer: Self-pay | Admitting: Family Medicine

## 2021-10-14 DIAGNOSIS — E039 Hypothyroidism, unspecified: Secondary | ICD-10-CM

## 2021-11-17 ENCOUNTER — Other Ambulatory Visit: Payer: Self-pay | Admitting: Cardiology

## 2021-11-17 ENCOUNTER — Encounter: Payer: Self-pay | Admitting: *Deleted

## 2021-11-17 ENCOUNTER — Other Ambulatory Visit: Payer: Self-pay | Admitting: Family Medicine

## 2021-11-17 DIAGNOSIS — E785 Hyperlipidemia, unspecified: Secondary | ICD-10-CM

## 2021-12-01 ENCOUNTER — Ambulatory Visit (INDEPENDENT_AMBULATORY_CARE_PROVIDER_SITE_OTHER): Payer: Medicare Other | Admitting: Family Medicine

## 2021-12-01 ENCOUNTER — Encounter: Payer: Self-pay | Admitting: Family Medicine

## 2021-12-01 VITALS — BP 122/76 | HR 83 | Temp 97.9°F | Resp 16 | Ht 63.0 in | Wt 121.2 lb

## 2021-12-01 DIAGNOSIS — E039 Hypothyroidism, unspecified: Secondary | ICD-10-CM | POA: Diagnosis not present

## 2021-12-01 DIAGNOSIS — E785 Hyperlipidemia, unspecified: Secondary | ICD-10-CM | POA: Diagnosis not present

## 2021-12-01 DIAGNOSIS — Z1159 Encounter for screening for other viral diseases: Secondary | ICD-10-CM

## 2021-12-01 DIAGNOSIS — E7800 Pure hypercholesterolemia, unspecified: Secondary | ICD-10-CM | POA: Diagnosis not present

## 2021-12-01 NOTE — Assessment & Plan Note (Signed)
Check labs con't synthroid 

## 2021-12-01 NOTE — Progress Notes (Signed)
Subjective:   By signing my name below, I, Jaclyn Bennett, attest that this documentation has been prepared under the direction and in the presence of [name], DO  12/01/2021    Patient ID: Jaclyn Bennett, female    DOB: 1951/04/02, 71 y.o.   MRN: [id number]  Chief Complaint  Patient presents with   Hypothyroidism   Hyperlipidemia   Follow-up    Hyperlipidemia   Patient is in today for a follow up visit.   During last visit she had difficulty sleeping. She reports seeing another provider and was reccommended to cut her Flexeril dosage in half and reports doing well on it.  She reports gaining 5 lb's since her last visit. She tries to participates in regular exercise by walking at least 1 mile in her neighborhood.  Wt Readings from Last 3 Encounters:  12/01/21 121 lb 3.2 oz (55 kg)  05/13/21 118 lb (53.5 kg)  04/14/21 116 lb (52.6 kg)   She continues taking 20 mg Lipitor daily PO.  Lab Results  Component Value Date   CHOL 191 05/19/2021   HDL 45 05/19/2021   LDLCALC 115 (H) 05/19/2021   LDLDIRECT 114.0 08/03/2018   TRIG 175 (H) 05/19/2021   CHOLHDL 4.2 05/19/2021   Her blood pressure is doing well during this visit. She continues taking 2.5 mg lisinopril daily PO and reports no new issues while taking it. She is planning on scheduling an appointment with her cardiologist.  She is not interested in completing a colonoscopy anymore.  She is not interested in receiving the pneumonia vaccine. She is due for the tetanus vaccine and is willing to receive it at her pharmacy if she decides to receive it.   Past Medical History:  Diagnosis Date   Acute asthmatic bronchitis    Anxiety    Fibromyalgia    Herpes simplex type 2 infection 03/30/2016   Hypercholesterolemia    Hypothyroidism    Interstitial cystitis     Past Surgical History:  Procedure Laterality Date   LEFT HEART CATHETERIZATION WITH CORONARY ANGIOGRAM Bilateral 03/13/2014   Procedure: LEFT  HEART CATHETERIZATION WITH CORONARY ANGIOGRAM;  Surgeon: Blane Ohara, MD;  Location: [facility] CATH LAB;  Service: Cardiovascular;  Laterality: Bilateral;   TOTAL ABDOMINAL HYSTERECTOMY  1997    Family History  Problem Relation Age of Onset   Other Mother        bronchiectasis   Pneumonia Mother    Heart failure Mother    Alzheimer's disease Father    Stroke Father    Heart failure Father    Pancreatic cancer Brother    Hyperlipidemia Sister    Other Sister        bronchiectasis    Social History   Socioeconomic History   Marital status: Divorced    Spouse name: Not on file   Number of children: 3   Years of education: Not on file   Highest education level: Not on file  Occupational History   Occupation: school teacher - substitute teaching now  Tobacco Use   Smoking status: Never   Smokeless tobacco: Never  Vaping Use   Vaping Use: Never used  Substance and Sexual Activity   Alcohol use: Not Currently    Comment: Rare   Drug use: No   Sexual activity: Not on file  Other Topics Concern   Not on file  Social History Narrative   Plays piano, 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   Declines flu and pneumonia  shot 04-30-10   Social Determinants of Health   Financial Resource Strain: Low Risk  (04/14/2021)   Overall Financial Resource Strain (CARDIA)    Difficulty of Paying Living Expenses: Not hard at all  Food Insecurity: No Food Insecurity (04/14/2021)   Hunger Vital Sign    Worried About Running Out of Food in the Last Year: Never true    Ran Out of Food in the Last Year: Never true  Transportation Needs: No Transportation Needs (04/14/2021)   PRAPARE - Hydrologist (Medical): No    Lack of Transportation (Non-Medical): No  Physical Activity: Insufficiently Active (04/14/2021)   Exercise Vital Sign    Days of Exercise per Week: 7 days    Minutes of Exercise per Session: 20 min  Stress: No Stress Concern Present (04/14/2021)   [place]    Feeling of Stress : Not at all  Social Connections: Moderately Integrated (04/14/2021)   Social Connection and Isolation Panel [NHANES]    Frequency of Communication with Friends and Family: More than three times a week    Frequency of Social Gatherings with Friends and Family: More than three times a week    Attends Religious Services: More than 4 times per year    Active Member of [employer] or Organizations: Yes    Attends Music therapist: More than 4 times per year    Marital Status: Divorced  Intimate Partner Violence: Not At Risk (04/14/2021)   Humiliation, Afraid, Rape, and Kick questionnaire    Fear of Current or Ex-Partner: No    Emotionally Abused: No    Physically Abused: No    Sexually Abused: No    Outpatient Medications Prior to Visit  Medication Sig Dispense Refill   aspirin 81 MG tablet Take 81 mg by mouth daily.     atorvastatin (LIPITOR) 20 MG tablet TAKE 1/2 TABLET BY MOUTH EVERY DAY 45 tablet 0   Cholecalciferol (VITAMIN D) 1000 UNITS capsule Take 5,000 Units by mouth daily.     Coconut Oil 1000 MG CAPS Take 1,000 mg by mouth daily.      Coenzyme Q10 (COQ10) 100 MG CAPS Take 1 capsule by mouth daily.     COLLAGEN PO Take 1 capsule by mouth daily.     cyclobenzaprine (FLEXERIL) 10 MG tablet Take 5 mg by mouth daily.     estradiol (ESTRACE) 0.5 MG tablet Take 0.25 mg by mouth every other day.      famciclovir (FAMVIR) 250 MG tablet TAKE 1/2 TABLET BY MOUTH DAILY 15 tablet 5   levothyroxine (SYNTHROID) 75 MCG tablet Take 1 tablet (75 mcg total) by mouth daily with breakfast. Pt needs office visit for further refills 90 tablet 0   lisinopril (ZESTRIL) 2.5 MG tablet TAKE ONE (1) TABLET BY MOUTH EVERY DAY 90 tablet 3   Multiple Vitamins-Minerals (ZINC PO) Take by mouth daily.     NON FORMULARY Take 2 tablets by mouth daily. Bone-Up: Calcium Supplement     vitamin C (ASCORBIC ACID) 500 MG tablet Take 1,000  mg by mouth daily.     vitamin E 400 UNIT capsule Take 400 Units by mouth daily.     No facility-administered medications prior to visit.    Allergies  Allergen Reactions   Iodine Anaphylaxis   Valtrex [Valacyclovir Hcl] Other (See Comments)    Severe nausea    Anesthetics, Amide Nausea And Vomiting   Benadryl Allergy [Diphenhydramine  Hcl (Sleep)]     Makes patient hyper   Diphenhydramine    Sulfonamide Derivatives     REACTION: throat swelling and itching    ROS     Objective:    Physical Exam Constitutional:      General: She is not in acute distress.    Appearance: Normal appearance. She is not ill-appearing.  HENT:     Head: Normocephalic and atraumatic.     Right Ear: External ear normal.     Left Ear: External ear normal.  Eyes:     Extraocular Movements: Extraocular movements intact.     Pupils: Pupils are equal, round, and reactive to light.  Cardiovascular:     Rate and Rhythm: Normal rate and regular rhythm.     Heart sounds: Normal heart sounds. No murmur heard.    No gallop.  Pulmonary:     Effort: Pulmonary effort is normal. No respiratory distress.     Breath sounds: Normal breath sounds. No wheezing or rales.  Skin:    General: Skin is warm and dry.  Neurological:     Mental Status: She is alert and oriented to person, place, and time.  Psychiatric:        Judgment: Judgment normal.     BP 122/76 (BP Location: Left Arm, Patient Position: Sitting, Cuff Size: Normal)   Pulse 83   Temp 97.9 F (36.6 C) (Oral)   Resp 16   Ht 5' 3"  (1.6 m)   Wt 121 lb 3.2 oz (55 kg)   SpO2 99%   BMI 21.47 kg/m  Wt Readings from Last 3 Encounters:  12/01/21 121 lb 3.2 oz (55 kg)  05/13/21 118 lb (53.5 kg)  04/14/21 116 lb (52.6 kg)    Diabetic Foot Exam - Simple   No data filed    Lab Results  Component Value Date   WBC 6.5 10/16/2020   HGB 12.9 10/16/2020   HCT 37.8 10/16/2020   PLT 347 10/16/2020   GLUCOSE 107 (H) 10/16/2020   CHOL 191  05/19/2021   TRIG 175 (H) 05/19/2021   HDL 45 05/19/2021   LDLDIRECT 114.0 08/03/2018   LDLCALC 115 (H) 05/19/2021   ALT 22 05/19/2021   AST 24 05/19/2021   NA 141 10/16/2020   K 4.0 10/16/2020   CL 101 10/16/2020   CREATININE 0.73 10/16/2020   BUN 18 10/16/2020   CO2 28 10/16/2020   TSH 2.030 10/16/2020   INR 1.03 03/04/2014   HGBA1C 5.8 08/12/2016    Lab Results  Component Value Date   TSH 2.030 10/16/2020   Lab Results  Component Value Date   WBC 6.5 10/16/2020   HGB 12.9 10/16/2020   HCT 37.8 10/16/2020   MCV 90 10/16/2020   PLT 347 10/16/2020   Lab Results  Component Value Date   NA 141 10/16/2020   K 4.0 10/16/2020   CO2 28 10/16/2020   GLUCOSE 107 (H) 10/16/2020   BUN 18 10/16/2020   CREATININE 0.73 10/16/2020   BILITOT 0.4 05/19/2021   ALKPHOS 104 05/19/2021   AST 24 05/19/2021   ALT 22 05/19/2021   PROT 6.4 05/19/2021   ALBUMIN 4.3 05/19/2021   CALCIUM 9.8 10/16/2020   EGFR 88 10/16/2020   GFR 73.25 06/18/2019   Lab Results  Component Value Date   CHOL 191 05/19/2021   Lab Results  Component Value Date   HDL 45 05/19/2021   Lab Results  Component Value Date   LDLCALC 115 (H) 05/19/2021  Lab Results  Component Value Date   TRIG 175 (H) 05/19/2021   Lab Results  Component Value Date   CHOLHDL 4.2 05/19/2021   Lab Results  Component Value Date   HGBA1C 5.8 08/12/2016       Assessment & Plan:   Problem List Items Addressed This Visit       Unprioritized   Hypothyroidism - Primary    Check labs  con't synthroid       Relevant Orders   Comprehensive metabolic panel   CBC with Differential/Platelet   TSH   Lipid panel   HYPERCHOLESTEROLEMIA    Encourage heart healthy diet such as MIND or DASH diet, increase exercise, avoid trans fats, simple carbohydrates and processed foods, consider a krill or fish or flaxseed oil cap daily.       Other Visit Diagnoses     Hyperlipidemia, unspecified hyperlipidemia type        Relevant Orders   Comprehensive metabolic panel   CBC with Differential/Platelet   TSH   Lipid panel   Need for hepatitis C screening test       Relevant Orders   Hepatitis C antibody        No orders of the defined types were placed in this encounter.   IAnn Held, DO, personally preformed the services described in this documentation.  All medical record entries made by the scribe were at my direction and in my presence.  I have reviewed the chart and discharge instructions (if applicable) and agree that the record reflects my personal performance and is accurate and complete. 12/01/2021   I,Jaclyn Bennett,acting as a Education administrator for [employer], DO.,have documented all relevant documentation on the behalf of [name], DO,as directed by  [name], DO while in the presence of [name], DO.   [name], DO

## 2021-12-01 NOTE — Patient Instructions (Signed)

## 2021-12-01 NOTE — Assessment & Plan Note (Signed)
Encourage heart healthy diet such as MIND or DASH diet, increase exercise, avoid trans fats, simple carbohydrates and processed foods, consider a krill or fish or flaxseed oil cap daily.  °

## 2021-12-02 LAB — COMPREHENSIVE METABOLIC PANEL
ALT: 20 U/L (ref 0–35)
AST: 21 U/L (ref 0–37)
Albumin: 4.2 g/dL (ref 3.5–5.2)
Alkaline Phosphatase: 99 U/L (ref 39–117)
BUN: 20 mg/dL (ref 6–23)
CO2: 31 mEq/L (ref 19–32)
Calcium: 9.5 mg/dL (ref 8.4–10.5)
Chloride: 100 mEq/L (ref 96–112)
Creatinine, Ser: 0.85 mg/dL (ref 0.40–1.20)
GFR: 68.95 mL/min (ref >60.00)
Glucose, Bld: 103 mg/dL — ABNORMAL HIGH (ref 70–99)
Potassium: 4.1 mEq/L (ref 3.5–5.1)
Sodium: 138 mEq/L (ref 135–145)
Total Bilirubin: 0.3 mg/dL (ref 0.2–1.2)
Total Protein: 6.8 g/dL (ref 6.0–8.3)

## 2021-12-02 LAB — CBC WITH DIFFERENTIAL/PLATELET
Basophils Absolute: 0 10*3/uL (ref 0.0–0.1)
Basophils Relative: 0.6 % (ref 0.0–3.0)
Eosinophils Absolute: 0.1 10*3/uL (ref 0.0–0.7)
Eosinophils Relative: 1.5 % (ref 0.0–5.0)
HCT: 38.5 % (ref 36.0–46.0)
Hemoglobin: 12.8 g/dL (ref 12.0–15.0)
Lymphocytes Relative: 22.1 % (ref 12.0–46.0)
Lymphs Abs: 1.5 10*3/uL (ref 0.7–4.0)
MCHC: 33.2 g/dL (ref 30.0–36.0)
MCV: 93.2 fl (ref 78.0–100.0)
Monocytes Absolute: 0.7 10*3/uL (ref 0.1–1.0)
Monocytes Relative: 9.8 % (ref 3.0–12.0)
Neutro Abs: 4.6 10*3/uL (ref 1.4–7.7)
Neutrophils Relative %: 66 % (ref 43.0–77.0)
Platelets: 339 10*3/uL (ref 150.0–400.0)
RBC: 4.14 Mil/uL (ref 3.87–5.11)
RDW: 12.3 % (ref 11.5–15.5)
WBC: 7 10*3/uL (ref 4.0–10.5)

## 2021-12-02 LAB — TSH: TSH: 1.74 u[IU]/mL (ref 0.35–5.50)

## 2021-12-02 LAB — LIPID PANEL
Cholesterol: 195 mg/dL (ref 0–200)
HDL: 42.1 mg/dL (ref >39.00)
NonHDL: 152.55
Total CHOL/HDL Ratio: 5
Triglycerides: 325 mg/dL — ABNORMAL HIGH (ref 0.0–149.0)
VLDL: 65 mg/dL — ABNORMAL HIGH (ref 0.0–40.0)

## 2021-12-02 LAB — HEPATITIS C ANTIBODY: Hepatitis C Ab: NONREACTIVE

## 2021-12-02 LAB — LDL CHOLESTEROL, DIRECT: Direct LDL: 116 mg/dL

## 2021-12-04 ENCOUNTER — Telehealth: Payer: Self-pay | Admitting: Family Medicine

## 2021-12-04 ENCOUNTER — Other Ambulatory Visit: Payer: Self-pay

## 2021-12-04 MED ORDER — FENOFIBRATE 145 MG PO TABS: 145.0000 mg | ORAL_TABLET | Freq: Every day | ORAL | 2 refills | Status: DC

## 2022-01-11 ENCOUNTER — Other Ambulatory Visit: Payer: Self-pay | Admitting: Family Medicine

## 2022-01-11 DIAGNOSIS — E039 Hypothyroidism, unspecified: Secondary | ICD-10-CM

## 2022-01-27 ENCOUNTER — Other Ambulatory Visit: Payer: Self-pay | Admitting: Family Medicine

## 2022-02-16 NOTE — Progress Notes (Signed)
HPI: FU cardiomyopathy; previously seen for atypical CP; stress echo 9/15 showed baseline EF 45; inferior, septal and apical HK both at rest and with stress. Cardiac cath 9/15 showed normal coronary arteries. Echocardiogram May 2022 showed normal LV function, grade 1 diastolic dysfunction, aneurysmal atrial septum with possible small PFO.  Since last seen, she notes increased fatigue for 3 months.  She also describes dyspnea on exertion which is new.  No orthopnea, PND, pedal edema, exertional chest pain or syncope.  Current Outpatient Medications  Medication Sig Dispense Refill   aspirin 81 MG tablet Take 81 mg by mouth daily.     atorvastatin (LIPITOR) 20 MG tablet TAKE 1/2 TABLET BY MOUTH EVERY DAY 45 tablet 0   Cholecalciferol (VITAMIN D) 1000 UNITS capsule Take 5,000 Units by mouth daily.     Coconut Oil 1000 MG CAPS Take 1,000 mg by mouth daily.      Coenzyme Q10 (COQ10) 100 MG CAPS Take 1 capsule by mouth daily.     COLLAGEN PO Take 1 capsule by mouth daily.     cyclobenzaprine (FLEXERIL) 10 MG tablet Take 5 mg by mouth daily.     estradiol (ESTRACE) 0.5 MG tablet Take 0.25 mg by mouth every other day.      famciclovir (FAMVIR) 250 MG tablet TAKE 1/2 TABLET BY MOUTH DAILY 15 tablet 5   fenofibrate (TRICOR) 145 MG tablet TAKE ONE (1) TABLET BY MOUTH EVERY DAY 30 tablet 2   levothyroxine (SYNTHROID) 75 MCG tablet TAKE ONE TABLET BY MOUTH EVERY MORNING BEFORE BREAKFAST 90 tablet 1   lisinopril (ZESTRIL) 2.5 MG tablet TAKE ONE (1) TABLET BY MOUTH EVERY DAY 90 tablet 3   Multiple Vitamins-Minerals (ZINC PO) Take by mouth daily.     NON FORMULARY Take 2 tablets by mouth daily. Bone-Up: Calcium Supplement     vitamin C (ASCORBIC ACID) 500 MG tablet Take 1,000 mg by mouth daily.     vitamin E 400 UNIT capsule Take 400 Units by mouth daily.     No current facility-administered medications for this visit.     Past Medical History:  Diagnosis Date   Acute asthmatic bronchitis     Anxiety    Fibromyalgia    Herpes simplex type 2 infection 03/30/2016   Hypercholesterolemia    Hypothyroidism    Interstitial cystitis     Past Surgical History:  Procedure Laterality Date   LEFT HEART CATHETERIZATION WITH CORONARY ANGIOGRAM Bilateral 03/13/2014   Procedure: LEFT HEART CATHETERIZATION WITH CORONARY ANGIOGRAM;  Surgeon: Blane Ohara, MD;  Location: [facility] CATH LAB;  Service: Cardiovascular;  Laterality: Bilateral;   TOTAL ABDOMINAL HYSTERECTOMY  1997    Social History   Socioeconomic History   Marital status: Divorced    Spouse name: Not on file   Number of children: 3   Years of education: Not on file   Highest education level: Not on file  Occupational History   Occupation: school teacher - substitute teaching now  Tobacco Use   Smoking status: Never   Smokeless tobacco: Never  Vaping Use   Vaping Use: Never used  Substance and Sexual Activity   Alcohol use: Not Currently    Comment: Rare   Drug use: No   Sexual activity: Not on file  Other Topics Concern   Not on file  Social History Narrative   Plays piano, 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   Declines flu and pneumonia shot 04-30-10   Social Determinants of Health  Financial Resource Strain: Low Risk  (04/14/2021)   Overall Financial Resource Strain (CARDIA)    Difficulty of Paying Living Expenses: Not hard at all  Food Insecurity: No Food Insecurity (04/14/2021)   Hunger Vital Sign    Worried About Running Out of Food in the Last Year: Never true    Ran Out of Food in the Last Year: Never true  Transportation Needs: No Transportation Needs (04/14/2021)   PRAPARE - Hydrologist (Medical): No    Lack of Transportation (Non-Medical): No  Physical Activity: Insufficiently Active (04/14/2021)   Exercise Vital Sign    Days of Exercise per Week: 7 days    Minutes of Exercise per Session: 20 min  Stress: No Stress Concern Present (04/14/2021)   [place]    Feeling of Stress : Not at all  Social Connections: Moderately Integrated (04/14/2021)   Social Connection and Isolation Panel [NHANES]    Frequency of Communication with Friends and Family: More than three times a week    Frequency of Social Gatherings with Friends and Family: More than three times a week    Attends Religious Services: More than 4 times per year    Active Member of [employer] or Organizations: Yes    Attends Music therapist: More than 4 times per year    Marital Status: Divorced  Intimate Partner Violence: Not At Risk (04/14/2021)   Humiliation, Afraid, Rape, and Kick questionnaire    Fear of Current or Ex-Partner: No    Emotionally Abused: No    Physically Abused: No    Sexually Abused: No    Family History  Problem Relation Age of Onset   Other Mother        bronchiectasis   Pneumonia Mother    Heart failure Mother    Alzheimer's disease Father    Stroke Father    Heart failure Father    Pancreatic cancer Brother    Hyperlipidemia Sister    Other Sister        bronchiectasis    ROS: no fevers or chills, productive cough, hemoptysis, dysphasia, odynophagia, melena, hematochezia, dysuria, hematuria, rash, seizure activity, orthopnea, PND, pedal edema, claudication. Remaining systems are negative.  Physical Exam: Well-developed well-nourished in no acute distress.  Skin is warm and dry.  HEENT is normal.  Neck is supple.  Chest is clear to auscultation with normal expansion.  Cardiovascular exam is regular rate and rhythm.  Abdominal exam nontender or distended. No masses palpated. Extremities show no edema. neuro grossly intact  ECG-normal sinus rhythm with occasional PVC, inferior lateral ST depression.  No change compared to Oct 16, 2020.  Personally reviewed  A/P  1 nonischemic cardiomyopathy-LV function normal on most recent echocardiogram.  Continue ACE inhibitor.  She is not on beta-blockade as it  caused fatigue previously.  2 hyperlipidemia-continue statin.  Check liver functions and lipids.  3 dyspnea-etiology unclear.  She is not volume overloaded on examination.  We will repeat echocardiogram.  She does not have risk factors for pulmonary embolus.  4 fatigue-we will check TSH, hemoglobin and CK (she is on both atorvastatin and fenofibrate).  Kirk Ruths, MD

## 2022-02-22 ENCOUNTER — Other Ambulatory Visit: Payer: Self-pay | Admitting: Obstetrics and Gynecology

## 2022-02-22 DIAGNOSIS — Z1231 Encounter for screening mammogram for malignant neoplasm of breast: Secondary | ICD-10-CM

## 2022-02-24 ENCOUNTER — Other Ambulatory Visit: Payer: Self-pay | Admitting: Family Medicine

## 2022-02-25 ENCOUNTER — Ambulatory Visit: Payer: Medicare Other | Attending: Cardiology | Admitting: Cardiology

## 2022-02-25 ENCOUNTER — Encounter: Payer: Self-pay | Admitting: Cardiology

## 2022-02-25 VITALS — BP 112/66 | HR 84 | Ht 63.0 in | Wt 119.0 lb

## 2022-02-25 DIAGNOSIS — E7800 Pure hypercholesterolemia, unspecified: Secondary | ICD-10-CM

## 2022-02-25 DIAGNOSIS — R5383 Other fatigue: Secondary | ICD-10-CM

## 2022-02-25 DIAGNOSIS — R0602 Shortness of breath: Secondary | ICD-10-CM

## 2022-02-25 DIAGNOSIS — I420 Dilated cardiomyopathy: Secondary | ICD-10-CM | POA: Diagnosis not present

## 2022-02-25 LAB — LIPID PANEL
Chol/HDL Ratio: 3 ratio (ref 0.0–4.4)
Cholesterol, Total: 167 mg/dL (ref 100–199)
HDL: 56 mg/dL (ref >39)
LDL Chol Calc (NIH): 97 mg/dL (ref 0–99)
Triglycerides: 74 mg/dL (ref 0–149)
VLDL Cholesterol Cal: 14 mg/dL (ref 5–40)

## 2022-02-25 LAB — COMPREHENSIVE METABOLIC PANEL
ALT: 124 IU/L — ABNORMAL HIGH (ref 0–32)
AST: 92 IU/L — ABNORMAL HIGH (ref 0–40)
Albumin/Globulin Ratio: 2.1 (ref 1.2–2.2)
Albumin: 4.6 g/dL (ref 3.8–4.8)
Alkaline Phosphatase: 98 IU/L (ref 44–121)
BUN/Creatinine Ratio: 18 (ref 12–28)
BUN: 21 mg/dL (ref 8–27)
Bilirubin Total: 0.3 mg/dL (ref 0.0–1.2)
CO2: 27 mmol/L (ref 20–29)
Calcium: 10.2 mg/dL (ref 8.7–10.3)
Chloride: 104 mmol/L (ref 96–106)
Creatinine, Ser: 1.15 mg/dL — ABNORMAL HIGH (ref 0.57–1.00)
Globulin, Total: 2.2 g/dL (ref 1.5–4.5)
Glucose: 99 mg/dL (ref 70–99)
Potassium: 4 mmol/L (ref 3.5–5.2)
Sodium: 143 mmol/L (ref 134–144)
Total Protein: 6.8 g/dL (ref 6.0–8.5)
eGFR: 51 mL/min/{1.73_m2} — ABNORMAL LOW (ref >59)

## 2022-02-25 NOTE — Patient Instructions (Signed)
  Testing/Procedures:  Your physician has requested that you have an echocardiogram. Echocardiography is a painless test that uses sound waves to create images of your heart. It provides your doctor with information about the size and shape of your heart and how well your heart's chambers and valves are working. This procedure takes approximately one hour. There are no restrictions for this procedure. [facility]   Follow-Up: At [facility], you and your health needs are our priority.  As part of our continuing mission to provide you with exceptional heart care, we have created designated Provider Care Teams.  These Care Teams include your primary Cardiologist (physician) and Advanced Practice Providers (APPs -  Physician Assistants and Nurse Practitioners) who all work together to provide you with the care you need, when you need it.  We recommend signing up for the patient portal called "MyChart".  Sign up information is provided on this After Visit Summary.  MyChart is used to connect with patients for Virtual Visits (Telemedicine).  Patients are able to view lab/test results, encounter notes, upcoming appointments, etc.  Non-urgent messages can be sent to your provider as well.   To learn more about what you can do with MyChart, go to [email or web address].    Your next appointment:   6 month(s)  The format for your next appointment:   In Person  Provider:   Kirk Ruths, MD  IN THE [place]

## 2022-02-26 ENCOUNTER — Other Ambulatory Visit: Payer: Self-pay | Admitting: *Deleted

## 2022-02-26 ENCOUNTER — Telehealth: Payer: Self-pay | Admitting: Family Medicine

## 2022-02-26 ENCOUNTER — Telehealth: Payer: Self-pay | Admitting: *Deleted

## 2022-02-26 DIAGNOSIS — R7989 Other specified abnormal findings of blood chemistry: Secondary | ICD-10-CM

## 2022-02-26 LAB — CBC
Hematocrit: 36.1 % (ref 34.0–46.6)
Hemoglobin: 12.3 g/dL (ref 11.1–15.9)
MCH: 30.8 pg (ref 26.6–33.0)
MCHC: 34.1 g/dL (ref 31.5–35.7)
MCV: 91 fL (ref 79–97)
Platelets: 406 10*3/uL (ref 150–450)
RBC: 3.99 x10E6/uL (ref 3.77–5.28)
RDW: 11.9 % (ref 11.7–15.4)
WBC: 4.6 10*3/uL (ref 3.4–10.8)

## 2022-02-26 LAB — TSH: TSH: 3.11 u[IU]/mL (ref 0.450–4.500)

## 2022-02-26 LAB — CK: Total CK: 120 U/L (ref 32–182)

## 2022-02-26 NOTE — Telephone Encounter (Signed)
-----   Message from Lelon Perla, MD sent at 02/26/2022  7:31 AM EDT ----- LFTs elevated; DC tricor; repeat LFTs 6 weeks Kirk Ruths

## 2022-02-26 NOTE — Telephone Encounter (Signed)
Please advise 

## 2022-02-26 NOTE — Telephone Encounter (Signed)
Pt called stating that off of the labs done with Dr. Jacalyn Lefevre office, her triglycerides are down to 74 and she was wondering if she needs to continue taking the fenofibrate as she has 6 pills left? Please Advise

## 2022-03-01 NOTE — Telephone Encounter (Signed)
Pt made aware

## 2022-03-05 ENCOUNTER — Ambulatory Visit (HOSPITAL_BASED_OUTPATIENT_CLINIC_OR_DEPARTMENT_OTHER)
Admission: RE | Admit: 2022-03-05 | Discharge: 2022-03-05 | Disposition: A | Discharge status: Home / Self Care | Payer: Medicare Other | Source: Ambulatory Visit | Attending: Cardiology | Admitting: Cardiology

## 2022-03-05 DIAGNOSIS — R0602 Shortness of breath: Secondary | ICD-10-CM

## 2022-03-05 NOTE — Progress Notes (Signed)
  Echocardiogram 2D Echocardiogram has been performed.  Merrie Roof F 03/05/2022, 2:01 PM

## 2022-03-06 LAB — ECHOCARDIOGRAM COMPLETE
AR max vel: 1.62 cm2
AV Area VTI: 1.78 cm2
AV Area mean vel: 1.57 cm2
AV Mean grad: 3 mmHg
AV Peak grad: 5 mmHg
Ao pk vel: 1.12 m/s
Area-P 1/2: 4.8 cm2
S' Lateral: 2.8 cm

## 2022-03-22 ENCOUNTER — Other Ambulatory Visit: Payer: Self-pay | Admitting: Family Medicine

## 2022-03-22 DIAGNOSIS — E785 Hyperlipidemia, unspecified: Secondary | ICD-10-CM

## 2022-04-08 DIAGNOSIS — R7989 Other specified abnormal findings of blood chemistry: Secondary | ICD-10-CM | POA: Diagnosis not present

## 2022-04-09 LAB — HEPATIC FUNCTION PANEL
ALT: 70 IU/L — ABNORMAL HIGH (ref 0–32)
AST: 39 IU/L (ref 0–40)
Albumin: 4.5 g/dL (ref 3.8–4.8)
Alkaline Phosphatase: 133 IU/L — ABNORMAL HIGH (ref 44–121)
Bilirubin Total: 0.2 mg/dL (ref 0.0–1.2)
Bilirubin, Direct: <0.1 mg/dL (ref 0.00–0.40)
Total Protein: 6.6 g/dL (ref 6.0–8.5)

## 2022-04-15 ENCOUNTER — Ambulatory Visit
Admission: RE | Admit: 2022-04-15 | Discharge: 2022-04-15 | Disposition: A | Discharge status: Home / Self Care | Payer: Medicare Other | Source: Ambulatory Visit | Attending: Obstetrics and Gynecology | Admitting: Obstetrics and Gynecology

## 2022-04-15 DIAGNOSIS — Z1231 Encounter for screening mammogram for malignant neoplasm of breast: Secondary | ICD-10-CM

## 2022-04-30 ENCOUNTER — Telehealth: Payer: Self-pay | Admitting: Family Medicine

## 2022-04-30 NOTE — Telephone Encounter (Signed)
Left message for patient to call back and schedule Medicare Annual Wellness Visit (AWV) either virtually or phone  Left  my Jaclyn Bennett number [phone]   Last AWV 04/14/21 please schedule with Nurse Health Adviser 2   45 min for awv-i and in office appointments 30 min for awv-s  phone/virtual appointments

## 2022-05-04 DIAGNOSIS — H5213 Myopia, bilateral: Secondary | ICD-10-CM | POA: Diagnosis not present

## 2022-05-11 ENCOUNTER — Encounter: Payer: Self-pay | Admitting: Family Medicine

## 2022-05-11 ENCOUNTER — Telehealth (INDEPENDENT_AMBULATORY_CARE_PROVIDER_SITE_OTHER): Payer: Medicare Other | Admitting: Family Medicine

## 2022-05-11 VITALS — Ht 63.0 in | Wt 120.0 lb

## 2022-05-11 DIAGNOSIS — Z0000 Encounter for general adult medical examination without abnormal findings: Secondary | ICD-10-CM | POA: Diagnosis not present

## 2022-05-11 NOTE — Patient Instructions (Addendum)
CHECKLIST FOR A HEALTHY LIFE:  -Follow up: -yearly for annual wellness visit with primary care office  -Eat a healthy whole foods based diet, get regular physical activity, manage stress and engage in social connections - see below for specific suggestions and more information.  Here is a list of your preventive care.health maintenance measures and plan:  Health Maintenance  Topic Date Due   COVID-19 Vaccine (1) Never done   Zoster Vaccines- Shingrix (1 of 2) Never done   INFLUENZA VACCINE  Never done   Medicare Annual Wellness (AWV)  04/14/2022   Pneumonia Vaccine 70+ Years old (1 - PCV) 12/02/2022 (Originally 07/18/2015)   COLONOSCOPY (Pts 45-55yr Insurance coverage will need to be confirmed)  12/02/2022 (Originally 06/14/2020)   DEXA SCAN  04/04/2023   MAMMOGRAM  04/15/2024   Hepatitis C Screening  Completed   HPV VACCINES  Aged Out    -See a dentist at least yearly  -Get your eyes checked per your eye specialist's recommendations  -Other Goals: -healthy whole food plant heavy diet (see below for details) -gradually increase aerobic exercise (add 5 minutes every 1-2 weeks) to goal of at least 150 minutes per week. If any chest discomfort, chest pain of difficulty breathing please stop and seek medical care. See below for additional instructions.       FOOD - THE FUEL FOR A HAPPY HEALTHY LIFE: -eat real food: lots of colorful vegetables (half the plate) -5-7 servings of fruits and veggies per day (exp. 2 veggies with lunch and dinner and 2 servings of fresh fruits per day) -consume on a regular basis: whole grains (make sure first ingredient on label contains the word "whole"), fresh fruits, fish, nuts, seeds, healthy oils (such as  olive oil, avocado oil, grape seed oil) -may eat small amounts of dairy and lean meat on occasion, but avoid processed meats such as ham, bacon, lunch meat, etc. -drink water -try to avoid fast food and pre-packaged foods, processed meat -try to avoid foods that contain any ingredients with names you do not recognize  -try to avoid sugar/sweets (except for the natural sugar that occurs in fresh fruit) -try to avoid sweet drinks -try to avoid white rice, white bread, pasta (unless whole grain), white or yellow potatoes  MOVE - the key to keeping your body moving and working best: -gradually increase intentional physical activity -move and stretch your body, legs, feet and arms when sitting for long periods -try to get at least 150 minutes per week os sweaty exercise once approved by your doctor, 20-30 minutes of sustained activity or two 10 minute episodes of sustained activity every day.  -recommend strength training or resistance training 2 days per week if approved by your doctor -recommend balance exercises 3+days per week:   Stand somewhere where you have something sturdy to hold onto if you lose balance.    1) lift up on toes, start with 5x per day and work up to 20x   2) stand and lift on leg straight out to the side so that foot is a few inches of the floor, start with 5x each side and work up to 20x each side   3) stand on one foot, start with 5 seconds each side and work up to 20 seconds on each side  -Silver sneakers https://tools.silversneakers.com  -Walk with a Doc: Hhttp://stephens-thompson.[email or web address] -try to include resistance (weight lifting/strength building) and balance exercises twice per week: or the following link for ideas: hChessContest.fr hUpdateClothing.com.cy

## 2022-05-11 NOTE — Progress Notes (Signed)
PATIENT CHECK-IN and HEALTH RISK ASSESSMENT QUESTIONNAIRE:  -completed by phone/video for upcoming Medicare Preventive Visit  Pre-Visit Check-in: 1)Vitals (height, wt, BP, etc) - record in vitals section for visit on day of visit 2)Review and Update Medications, Allergies PMH, Surgeries, Social history in Epic 3)Hospitalizations in the last year with date/reason? No  4)Review and Update Care Team (patient's specialists) in Epic 5) Complete PHQ9 in Epic  6) Complete Fall Screening in Epic 7)Review all Health Maintenance Due and order under PCP if not done.  8)Medicare Wellness Questionnaire: Answer theses question about your habits: Do you drink alcohol? No If yes, how many drinks do you have a day? Have you ever smoked? No Quit date if applicable?   How many packs a day do/did you smoke? None Do you use smokeless tobacco? No Do you use an illicit drugs? No Do you exercises? Yes IF so, what type and how many days/minutes per week? Walks 3 x's a week, usually over a mile. Loves her neighborhood - lots of social connections. Denies any problems with balance.  Are you sexually active? No Number of partners? 0 Typical breakfast: Frosted mini wheats with a cut up banana, spring and summer blueberries Typical lunch: Yogurt with chopped pecans or peanut butter & jelly sandwich, glass of milk Typical dinner: Bowl of raison bran w/ banana, chinese food, meat, vegetables and potatoes. Typical snacks: White grapes, raw carrots, stick pretzels  Beverages: small glass of coke, water all the time.  Answer theses question about you: Can you perform most household chores? Yes Do you find it hard to follow a conversation in a noisy room? No Do you often ask people to speak up or repeat themselves? No Do you feel that you have a problem with memory? No Do you balance your checkbook and or bank acounts? Yes Do you feel safe at home? Yes Last dentist visit? 11/24/2021 Do you need assistance with any of  the following: Please note if so,  Driving? No  Feeding yourself? No  Getting from bed to chair? No  Getting to the toilet? No  Bathing or showering? No  Dressing yourself? No  Managing money? No  Climbing a flight of stairs? No  Preparing meals? No  Do you have Advanced Directives in place (Living Will, Healthcare Power or Attorney)? Yes   Last eye Exam and location? 05/04/2022 [facility] in [place]   Do you currently use prescribed or non-prescribed narcotic or opioid pain medications? NO  Do you have a history or close family history of breast, ovarian, tubal or peritoneal cancer or a family member with BRCA (breast cancer susceptibility 1 and 2) gene mutations? No  Nurse/Assistant Credentials/time stamp: 05/11/2022 at 10:56, I acted as a Education administrator for Dr. Colin Benton / Anselmo Pickler, LPN    ----------------------------------------------------------------------------------------------------------------------------------------------------------------------------------------------------------------------   [place]: (Welcome to [facility], initial annual wellness or annual wellness exam)  Virtual Visit via Video Note  I connected with Jaclyn Bennett  on 05/11/22 by a phone enabled telemedicine application and verified that I am speaking with the correct person using two identifiers.  Location patient: home Location provider:work or home office Persons participating in the virtual visit: patient, provider  Concerns and/or follow up today: none. Active. Strong social connections in community and at church.    See HM section in Epic for other details of completed HM.    ROS: negative for report of fevers, unintentional weight loss, vision changes, vision loss, hearing loss or change, chest  pain, sob, hemoptysis, melena, hematochezia, hematuria, genital discharge or lesions, falls, bleeding or bruising, loc, thoughts of suicide or self harm,  memory loss  Patient-completed extensive health risk assessment - reviewed and discussed with the patient: See Health Risk Assessment completed with patient prior to the visit either above or in recent phone note. This was reviewed in detailed with the patient today and appropriate recommendations, orders and referrals were placed as needed per Summary below and patient instructions.   Review of Medical History: -PMH, PSH, Family History and current specialty and care providers reviewed and updated and listed below   Patient Care Team: Carollee Herter, Alferd Apa, DO as PCP - General (Family Medicine) Stanford Breed Denice Bors, MD as PCP - Cardiology (Cardiology) Noralee Space, MD as Consulting Physician (Pulmonary Disease) Stanford Breed Denice Bors, MD as Consulting Physician (Cardiology) Paula Compton, MD as Consulting Physician (Obstetrics and Gynecology) Bjorn Loser, MD as Consulting Physician (Urology)   Past Medical History:  Diagnosis Date   Acute asthmatic bronchitis    Anxiety    Fibromyalgia    Herpes simplex type 2 infection 03/30/2016   Hypercholesterolemia    Hypothyroidism    Interstitial cystitis     Past Surgical History:  Procedure Laterality Date   LEFT HEART CATHETERIZATION WITH CORONARY ANGIOGRAM Bilateral 03/13/2014   Procedure: LEFT HEART CATHETERIZATION WITH CORONARY ANGIOGRAM;  Surgeon: Blane Ohara, MD;  Location: [facility] CATH LAB;  Service: Cardiovascular;  Laterality: Bilateral;   TOTAL ABDOMINAL HYSTERECTOMY  1997    Social History   Socioeconomic History   Marital status: Divorced    Spouse name: Not on file   Number of children: 3   Years of education: Not on file   Highest education level: Not on file  Occupational History   Occupation: school teacher - substitute teaching now  Tobacco Use   Smoking status: Never   Smokeless tobacco: Never  Vaping Use   Vaping Use: Never used  Substance and Sexual Activity   Alcohol use: Not Currently    Comment:  Rare   Drug use: No   Sexual activity: Not on file  Other Topics Concern   Not on file  Social History Narrative   Plays piano, 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   Declines flu and pneumonia shot 04-30-10   Social Determinants of Health   Financial Resource Strain: Low Risk  (04/14/2021)   Overall Financial Resource Strain (CARDIA)    Difficulty of Paying Living Expenses: Not hard at all  Food Insecurity: No Food Insecurity (04/14/2021)   Hunger Vital Sign    Worried About Running Out of Food in the Last Year: Never true    [place] in the Last Year: Never true  Transportation Needs: No Transportation Needs (04/14/2021)   PRAPARE - Hydrologist (Medical): No    Lack of Transportation (Non-Medical): No  Physical Activity: Insufficiently Active (04/14/2021)   Exercise Vital Sign    Days of Exercise per Week: 7 days    Minutes of Exercise per Session: 20 min  Stress: No Stress Concern Present (04/14/2021)   [place]    Feeling of Stress : Not at all  Social Connections: Moderately Integrated (04/14/2021)   Social Connection and Isolation Panel [NHANES]    Frequency of Communication with Friends and Family: More than three times a week    Frequency of Social Gatherings with Friends and Family: More than three times a week  Attends Religious Services: More than 4 times per year    Active Member of Clubs or Organizations: Yes    Attends Archivist Meetings: More than 4 times per year    Marital Status: Divorced  Intimate Partner Violence: Not At Risk (04/14/2021)   Humiliation, Afraid, Rape, and Kick questionnaire    Fear of Current or Ex-Partner: No    Emotionally Abused: No    Physically Abused: No    Sexually Abused: No    Family History  Problem Relation Age of Onset   Other Mother        bronchiectasis   Pneumonia Mother    Heart failure Mother    Alzheimer's disease Father     Stroke Father    Heart failure Father    Pancreatic cancer Brother    Hyperlipidemia Sister    Other Sister        bronchiectasis    Current Outpatient Medications on File Prior to Visit  Medication Sig Dispense Refill   aspirin 81 MG tablet Take 81 mg by mouth daily.     atorvastatin (LIPITOR) 20 MG tablet Take 0.5 tablets (10 mg total) by mouth daily. 45 tablet 0   Cholecalciferol (VITAMIN D) 1000 UNITS capsule Take 5,000 Units by mouth daily.     Coconut Oil 1000 MG CAPS Take 1,000 mg by mouth daily.      Coenzyme Q10 (COQ10) 100 MG CAPS Take 1 capsule by mouth daily.     COLLAGEN PO Take 1 capsule by mouth daily.     cyclobenzaprine (FLEXERIL) 10 MG tablet Take 1 tablet (10 mg total) by mouth daily. (Patient taking differently: Take 5-10 mg by mouth daily.) 90 tablet 0   estradiol (ESTRACE) 0.5 MG tablet Take 0.25 mg by mouth every other day.      famciclovir (FAMVIR) 250 MG tablet TAKE 1/2 TABLET BY MOUTH DAILY 15 tablet 5   levothyroxine (SYNTHROID) 75 MCG tablet TAKE ONE TABLET BY MOUTH EVERY MORNING BEFORE BREAKFAST 90 tablet 1   lisinopril (ZESTRIL) 2.5 MG tablet TAKE ONE (1) TABLET BY MOUTH EVERY DAY 90 tablet 3   Multiple Vitamins-Minerals (ZINC PO) Take by mouth daily.     NON FORMULARY Take 2 tablets by mouth daily. Bone-Up: Calcium Supplement     vitamin C (ASCORBIC ACID) 500 MG tablet Take 1,000 mg by mouth daily.     vitamin E 400 UNIT capsule Take 400 Units by mouth daily.     Zinc 50 MG TABS Take 1 tablet by mouth daily in the afternoon.     No current facility-administered medications on file prior to visit.    Allergies  Allergen Reactions   Iodine Anaphylaxis   Valtrex [Valacyclovir Hcl] Other (See Comments)    Severe nausea    Anesthetics, Amide Nausea And Vomiting   Benadryl Allergy [Diphenhydramine Hcl (Sleep)]     Makes patient hyper   Diphenhydramine    Sulfonamide Derivatives     REACTION: throat swelling and itching       Physical  Exam There were no vitals filed for this visit. Estimated body mass index is 21.26 kg/m as calculated from the following:   Height as of this encounter: _0  (1.6 m).   Weight as of this encounter: 120 lb (54.4 kg). BP: reports usually 116/62  EKG (optional): deferred due to virtual visit  GENERAL: alert, oriented, sounds well and no audible sounds of distress  PSYCH/NEURO: pleasant and cooperative, no obvious depression or anxiety,  speech and thought processing grossly intact, Cognitive function grossly intact  [place] Office Visit from 08/03/2018 in [facility]  PHQ-9 Total Score 1           05/11/2022   11:03 AM 04/14/2021    1:51 PM 09/23/2020   10:51 AM 08/03/2018   11:32 AM 02/15/2017    2:47 PM  Depression screen PHQ 2/9  Decreased Interest 0 0 0 0 0  Down, Depressed, Hopeless 0 0 0 0 0  PHQ - 2 Score 0 0 0 0 0  Altered sleeping    0   Tired, decreased energy    1   Change in appetite    0   Feeling bad or failure about yourself     0   Trouble concentrating    0   Moving slowly or fidgety/restless    0   Suicidal thoughts    0   PHQ-9 Score    1        04/04/2020    2:12 PM 09/23/2020   10:51 AM 04/14/2021    1:49 PM 05/07/2022    1:55 PM 05/11/2022   11:03 AM  Fall Risk  Falls in the past year?  0 0 0 0  Was there an injury with Fall?  0 0 0 0  Fall Risk Category Calculator  0 0  0  Fall Risk Category  Low Low  Low  Patient Fall Risk Level Low fall risk  Low fall risk  Low fall risk  Fall risk Follow up   Falls prevention discussed  Falls evaluation completed     SUMMARY AND PLAN:  Encounter for Medicare annual wellness exam   Discussed applicable health maintenance/preventive health measures and advised and referred or ordered per patient preferences:  Health Maintenance  Topic Date Due   Pneumonia Vaccine 84+ Years old (1 - PCV) 12/02/2022 (Originally 07/18/2015)   COLONOSCOPY (Pts 45-20yr Insurance  coverage will need to be confirmed)  12/02/2022 (Originally 06/14/2020)   DEXA SCAN  04/04/2023   Medicare Annual Wellness (AWV)  05/12/2023   MAMMOGRAM  04/15/2024   Hepatitis C Screening  Completed   HPV VACCINES  Aged Out   INFLUENZA VACCINE  Discontinued   COVID-19 Vaccine  Discontinued   Zoster Vaccines- Shingrix  Discontinued   Reports she is not interested in getting vaccines. Declines pneumonia, flu, covid vaccines.    Education and counseling on the following was provided based on the above review of health and a plan/checklist for the patient, along with additional information discussed, was provided for the patient in the patient instructions :   -Advised and counseled on maintaining healthy weight and healthy lifestyle - including the importance of a health diet, regular physical activity, social connections and stress management. -Advised and counseled on a whole foods based healthy diet and regular exercise: discussed a heart healthy whole foods based diet at length. A summary of a healthy diet was provided in the Patient Instructions. - Recommended regular exercise and discussed options within the community.  -Advised yearly dental visits at minimum and regular eye exams   Follow up: see patient instructions     Patient Instructions  CHECKLIST FOR A HEALTHY LIFE:  -Follow up: -yearly for annual wellness visit with primary care office  -Eat a healthy whole foods based diet, get regular physical activity, manage stress and engage in social connections - see below for specific suggestions and more information.  Here is a list of your preventive care.health maintenance measures and plan:  Health Maintenance  Topic Date Due   COVID-19 Vaccine (1) Never done   Zoster Vaccines- Shingrix (1 of 2) Never done   INFLUENZA VACCINE  Never done   Medicare Annual Wellness (AWV)  04/14/2022   Pneumonia Vaccine 21+ Years old (1 - PCV) 12/02/2022 (Originally 07/18/2015)    COLONOSCOPY (Pts 45-13yr Insurance coverage will need to be confirmed)  12/02/2022 (Originally 06/14/2020)   DEXA SCAN  04/04/2023   MAMMOGRAM  04/15/2024   Hepatitis C Screening  Completed   HPV VACCINES  Aged Out    -See a dentist at least yearly  -Get your eyes checked per your eye specialist's recommendations  -Other Goals: -healthy whole food plant heavy diet (see below for details) -gradually increase aerobic exercise (add 5 minutes every 1-2 weeks) to goal of at least 150 minutes per week. If any chest discomfort, chest pain of difficulty breathing please stop and seek medical care. See below for additional instructions.     -----------------------------------------------------------------------------------------------------------------------------------------------------------------------------------------------------------------------------------------------------------  FOOD - THE FUEL FOR A HAPPY HEALTHY LIFE: -eat real food: lots of colorful vegetables (half the plate) -5-7 servings of fruits and veggies per day (exp. 2 veggies with lunch and dinner and 2 servings of fresh fruits per day) -consume on a regular basis: whole grains (make sure first ingredient on label contains the word "whole"), fresh fruits, fish, nuts, seeds, healthy oils (such as olive oil, avocado oil, grape seed oil) -may eat small amounts of dairy and lean meat on occasion, but avoid processed meats such as ham, bacon, lunch meat, etc. -drink water -try to avoid fast food and pre-packaged foods, processed meat -try to avoid foods that contain any ingredients with names you do not recognize  -try to avoid sugar/sweets (except for the natural sugar that occurs in fresh fruit) -try to avoid sweet drinks -try to avoid white rice, white bread, pasta (unless whole grain), white or yellow potatoes  MOVE - the key to keeping your body moving and working best: -gradually increase intentional physical  activity -move and stretch your body, legs, feet and arms when sitting for long periods -try to get at least 150 minutes per week os sweaty exercise once approved by your doctor, 20-30 minutes of sustained activity or two 10 minute episodes of sustained activity every day.  -recommend strength training or resistance training 2 days per week if approved by your doctor -recommend balance exercises 3+days per week:   Stand somewhere where you have something sturdy to hold onto if you lose balance.    1) lift up on toes, start with 5x per day and work up to 20x   2) stand and lift on leg straight out to the side so that foot is a few inches of the floor, start with 5x each side and work up to 20x each side   3) stand on one foot, start with 5 seconds each side and work up to 20 seconds on each side  -Silver sneakers https://tools.silversneakers.com  -Walk with a Doc: Hhttp://stephens-thompson.biz/ -try to include resistance (weight lifting/strength building) and balance exercises twice per week: or the following link for ideas: hChessContest.fr hUpdateClothing.com.cy    HLucretia Kern DO

## 2022-05-27 ENCOUNTER — Other Ambulatory Visit: Payer: Self-pay | Admitting: Family Medicine

## 2022-05-29 ENCOUNTER — Encounter (HOSPITAL_BASED_OUTPATIENT_CLINIC_OR_DEPARTMENT_OTHER): Payer: Self-pay | Admitting: Emergency Medicine

## 2022-05-29 ENCOUNTER — Other Ambulatory Visit: Payer: Self-pay

## 2022-05-29 ENCOUNTER — Emergency Department (HOSPITAL_BASED_OUTPATIENT_CLINIC_OR_DEPARTMENT_OTHER)
Admission: EM | Admit: 2022-05-29 | Discharge: 2022-05-29 | Disposition: A | Discharge status: Home / Self Care | Payer: Medicare Other | Attending: Emergency Medicine | Admitting: Emergency Medicine

## 2022-05-29 DIAGNOSIS — N390 Urinary tract infection, site not specified: Secondary | ICD-10-CM | POA: Insufficient documentation

## 2022-05-29 DIAGNOSIS — R319 Hematuria, unspecified: Secondary | ICD-10-CM | POA: Diagnosis present

## 2022-05-29 DIAGNOSIS — Z7982 Long term (current) use of aspirin: Secondary | ICD-10-CM | POA: Insufficient documentation

## 2022-05-29 DIAGNOSIS — E039 Hypothyroidism, unspecified: Secondary | ICD-10-CM | POA: Insufficient documentation

## 2022-05-29 LAB — URINALYSIS, ROUTINE W REFLEX MICROSCOPIC
Bilirubin Urine: NEGATIVE
Glucose, UA: NEGATIVE mg/dL
Ketones, ur: NEGATIVE mg/dL
Nitrite: NEGATIVE
Protein, ur: 30 mg/dL — AB
Specific Gravity, Urine: 1.015 (ref 1.005–1.030)
pH: 8.5 — ABNORMAL HIGH (ref 5.0–8.0)

## 2022-05-29 LAB — URINALYSIS, MICROSCOPIC (REFLEX): RBC / HPF: >50 RBC/hpf (ref 0–5)

## 2022-05-29 MED ORDER — PHENAZOPYRIDINE HCL 200 MG PO TABS: 200.0000 mg | ORAL_TABLET | Freq: Three times a day (TID) | ORAL | 0 refills | Status: DC

## 2022-05-29 MED ORDER — CEPHALEXIN 500 MG PO CAPS: 500.0000 mg | ORAL_CAPSULE | Freq: Two times a day (BID) | ORAL | 0 refills | Status: AC

## 2022-05-29 MED ORDER — ONDANSETRON HCL 4 MG PO TABS: 4.0000 mg | ORAL_TABLET | Freq: Four times a day (QID) | ORAL | 0 refills | Status: DC

## 2022-05-29 MED ORDER — CEPHALEXIN 250 MG PO CAPS
500.0000 mg | ORAL_CAPSULE | Freq: Once | ORAL | Status: AC
  Administered 2022-05-29: 500 mg via ORAL
  Filled 2022-05-29: qty 2

## 2022-05-29 NOTE — ED Triage Notes (Signed)
Pt arrives pov, steady gait, c/o hematuria and urinary frequency with dysuria since last night, and bilateral back pain today. Pt denies fever

## 2022-05-29 NOTE — Discharge Instructions (Signed)
You are seen today for blood in the urine.  You do have a urinary tract infection today.  You are given a prescription for antibiotics.  Make sure you finish the whole course.  You were given a medication called Pyridium to help with your discomfort.  This will turn your urine orange in color.  Follow-up close with your primary care doctor, come back to the ER for any new or worsening symptoms.

## 2022-05-29 NOTE — ED Provider Notes (Signed)
[facility] EMERGENCY DEPARTMENT Provider Note   CSN: [id number] Arrival date & time: 05/29/22  9798     History  Chief Complaint  Patient presents with   Hematuria    Jaclyn Bennett is a 71 y.o. female.  This is a 71 year old female with history of high cholesterol and hypothyroidism presents to ER with hematuria and dysuria that started this morning.  She has this happen once in the past and it was a UTI.  This physical exam, she does some mild low back tenderness, no flank pain, no fevers or chills, no vomiting.  She is she does feel slightly achy and generally unwell.  No abdominal pain chest pain or shortness of breath   Hematuria       Home Medications Prior to Admission medications   Medication Sig Start Date End Date Taking? Authorizing Provider  aspirin 81 MG tablet Take 81 mg by mouth daily.    [provider]  atorvastatin (LIPITOR) 20 MG tablet Take 0.5 tablets (10 mg total) by mouth daily. 03/22/22   [name], DO  Cholecalciferol (VITAMIN D) 1000 UNITS capsule Take 5,000 Units by mouth daily.    [provider]  Coconut Oil 1000 MG CAPS Take 1,000 mg by mouth daily.     [provider]  Coenzyme Q10 (COQ10) 100 MG CAPS Take 1 capsule by mouth daily.    [provider]  COLLAGEN PO Take 1 capsule by mouth daily.    [provider]  cyclobenzaprine (FLEXERIL) 10 MG tablet Take 1 tablet (10 mg total) by mouth daily. Patient taking differently: Take 5-10 mg by mouth daily. 02/24/22   [name], DO  estradiol (ESTRACE) 0.5 MG tablet Take 0.25 mg by mouth every other day.     [provider]  famciclovir (FAMVIR) 250 MG tablet Take 0.5 tablets (125 mg total) by mouth daily. 05/27/22   [name], DO  levothyroxine (SYNTHROID) 75 MCG tablet TAKE ONE TABLET BY MOUTH EVERY MORNING BEFORE BREAKFAST 01/11/22   Carollee Herter, Yvonne R, DO  lisinopril (ZESTRIL) 2.5 MG tablet  TAKE ONE (1) TABLET BY MOUTH EVERY DAY 11/17/21   Lelon Perla, MD  Multiple Vitamins-Minerals (ZINC PO) Take by mouth daily.    [provider]  NON FORMULARY Take 2 tablets by mouth daily. Bone-Up: Calcium Supplement    [provider]  vitamin C (ASCORBIC ACID) 500 MG tablet Take 1,000 mg by mouth daily.    [provider]  vitamin E 400 UNIT capsule Take 400 Units by mouth daily.    [provider]  Zinc 50 MG TABS Take 1 tablet by mouth daily in the afternoon.    [provider]      Allergies    Iodine; Valtrex [valacyclovir hcl]; Anesthetics, amide; Benadryl allergy [diphenhydramine hcl (sleep)]; Diphenhydramine; and Sulfonamide derivatives    Review of Systems   Review of Systems  Genitourinary:  Positive for hematuria.    Physical Exam Updated Vital Signs BP (!) 124/57 (BP Location: Left Arm)   Pulse 94   Temp (!) 97.5 F (36.4 C) (Oral)   Resp 18   Ht '5\' 3"'$  (1.6 m)   Wt 53.8 kg   SpO2 100%   BMI 21.03 kg/m  Physical Exam Vitals and nursing note reviewed.  Constitutional:      General: She is not in acute distress.    Appearance: She is well-developed.  HENT:  Head: Normocephalic and atraumatic.     Mouth/Throat:     Mouth: Mucous membranes are moist.  Eyes:     Conjunctiva/sclera: Conjunctivae normal.  Cardiovascular:     Rate and Rhythm: Normal rate and regular rhythm.     Heart sounds: No murmur heard. Pulmonary:     Effort: Pulmonary effort is normal. No respiratory distress.     Breath sounds: Normal breath sounds.  Abdominal:     Palpations: Abdomen is soft.     Tenderness: There is no abdominal tenderness. There is no right CVA tenderness or left CVA tenderness.  Musculoskeletal:        General: No swelling.     Cervical back: Neck supple.  Skin:    General: Skin is warm and dry.     Capillary Refill: Capillary refill takes less than 2 seconds.  Neurological:     General: No focal deficit  present.     Mental Status: She is alert and oriented to person, place, and time.  Psychiatric:        Mood and Affect: Mood normal.     ED Results / Procedures / Treatments   Labs (all labs ordered are listed, but only abnormal results are displayed) Labs Reviewed  URINE CULTURE  URINALYSIS, ROUTINE W REFLEX MICROSCOPIC    EKG None  Radiology No results found.  Procedures Procedures    Medications Ordered in ED Medications - No data to display  ED Course/ Medical Decision Making/ A&P Clinical Course as of 05/29/22 1118  Sat May 29, 2022  [address] GuestMarland Kitchen): MODERATE [CB]  1112 Hgb urine dipstick(!): LARGE [CB]    Clinical Course User Index [CB] Gwenevere Abbot, PA-C                           Medical Decision Making Differential diagnosis: UTI, ureterolithiasis, neoplasm of bladder, other Data reviewed: Urinalysis-she does leukocytes and blood ED course: Patient comes in with urinary symptoms and hematuria that started this morning.  She is well-appearing with no CVA tenderness and no abdominal tenderness, no vomiting.  Vitals are all reassuring.  Does have a UTI.  Given her lack of flank pain, abdominal pain or well appearance I do not feel she has a stone, I considered imaging for stone but given her lack of other symptoms do not feel this is indicated at this time.  Will treat her with antibiotics.  Urine culture was sent, advised on follow-up and return precautions.  Patient was also given phenazopyridine for symptomatic control and Zofran for nausea should she need  Amount and/or Complexity of Data Reviewed Labs: ordered.           Final Clinical Impression(s) / ED Diagnoses Final diagnoses:  None    Rx / DC Orders ED Discharge Orders     None         Darci Current 05/29/22 1129    Margette Fast, MD 06/06/22 [phone]

## 2022-05-31 ENCOUNTER — Telehealth: Payer: Self-pay | Admitting: *Deleted

## 2022-05-31 LAB — URINE CULTURE: Culture: >=100000 — AB

## 2022-05-31 NOTE — Telephone Encounter (Signed)
Patient went to ED on 05/29/22

## 2022-05-31 NOTE — Telephone Encounter (Signed)
Who Is Calling Patient / Member / Family / Caregiver Call Type Triage / Clinical Relationship To Patient Self Return Phone Number [phone] (Primary) Chief Complaint Urine, Blood In Reason for Call Symptomatic / Request for Health Information Initial Comment Caller is experiencing blood in the urine, along with the urgency to urinate. Translation No Nurse Assessment Nurse: Cox, RN, Holly Date/Time (Eastern Time): 05/29/2022 9:02:10 AM Confirm and document reason for call. If symptomatic, describe symptoms. ---Caller is experiencing blood in the urine, along with the urgency to urinate since Thursday. Taking AZOs. States the color of her urine is a totally different color than the color it is from taking the AZO's. Unknown fever because she's been drinking ice water. States she is having bilateral lower arm and hand pain when she's going to the bathroom  Final Disposition 05/29/2022 9:14:41 AM See PCP within 24 Hours Yes Cox, RN, [employer]  Referrals REFERRED TO PCP OFFICE [facility] [place] [facility] - ED

## 2022-06-01 ENCOUNTER — Telehealth (HOSPITAL_BASED_OUTPATIENT_CLINIC_OR_DEPARTMENT_OTHER): Payer: Self-pay | Admitting: *Deleted

## 2022-06-01 NOTE — Telephone Encounter (Signed)
Post ED Visit - Positive Culture Follow-up  Culture report reviewed by antimicrobial stewardship pharmacist: Andrew Team '[]'$  Elenor Quinones, Pharm.D. '[]'$  Heide Guile, Pharm.D., BCPS AQ-ID '[]'$  Parks Neptune, Pharm.D., BCPS '[]'$  Alycia Rossetti, Pharm.D., BCPS '[]'$  [place], [place].D., BCPS, AAHIVP '[]'$  Legrand Como, Pharm.D., BCPS, AAHIVP '[]'$  Salome Arnt, PharmD, BCPS '[]'$  Johnnette Gourd, PharmD, BCPS '[]'$  Hughes Better, PharmD, BCPS '[]'$  Leeroy Cha, PharmD '[]'$  Laqueta Linden, PharmD, BCPS '[]'$  Albertina Parr, PharmD  [place] Team '[]'$  Leodis Sias, PharmD '[]'$  Lindell Spar, PharmD '[]'$  Royetta Asal, PharmD '[]'$  Graylin Shiver, Rph '[]'$  Rema Fendt) Glennon Mac, PharmD '[]'$  Arlyn Dunning, PharmD '[]'$  Netta Cedars, PharmD '[]'$  Dia Sitter, PharmD '[]'$  Leone Haven, PharmD '[]'$  Gretta Arab, PharmD '[]'$  Theodis Shove, PharmD '[]'$  Peggyann Juba, PharmD '[]'$  Reuel Boom, PharmD   Positive urine culture Treated with Cephalexin, organism sensitive to the same and no further patient follow-up is required at this time.  Bertis Ruddy, Pharm D  Harlon Flor Talley 06/01/2022, 10:20 AM

## 2022-06-03 ENCOUNTER — Ambulatory Visit (INDEPENDENT_AMBULATORY_CARE_PROVIDER_SITE_OTHER): Payer: Medicare Other | Admitting: Family Medicine

## 2022-06-03 ENCOUNTER — Encounter: Payer: Self-pay | Admitting: Family Medicine

## 2022-06-03 VITALS — BP 108/68 | HR 80 | Temp 98.0°F | Resp 18 | Ht 63.0 in | Wt 118.0 lb

## 2022-06-03 DIAGNOSIS — R319 Hematuria, unspecified: Secondary | ICD-10-CM

## 2022-06-03 DIAGNOSIS — N390 Urinary tract infection, site not specified: Secondary | ICD-10-CM

## 2022-06-03 MED ORDER — FLUCONAZOLE 150 MG PO TABS: 150.0000 mg | ORAL_TABLET | Freq: Every day | ORAL | 0 refills | Status: DC

## 2022-06-03 NOTE — Assessment & Plan Note (Signed)
Finish abx per er  Recheck urine today

## 2022-06-03 NOTE — Progress Notes (Signed)
Subjective:   By signing my name below, I, Jaclyn Bennett, attest that this documentation has been prepared under the direction and in the presence of [name], 06/03/2022.   Patient ID: Jaclyn Bennett, female    DOB: [birth year], 71 y.o.   MRN: [id number]  Chief Complaint  Patient presents with  . ER follow uo    HPI Patient is in today for an office visit. yes itching  ED Follow Up Patient was admitted to the ED on 12/16 for a lower urinary tract infection. She states that since taking her 500 mg Keflex medication that she was prescribed for this problem, patient reports that she thinks the antibiotics are causing a yeast infection. Patient complains of fatigue. Health Maintenance Due  Topic Date Due  . DTaP/Tdap/Td (2 - Td or Tdap) 12/02/2020    Past Medical History:  Diagnosis Date  . Acute asthmatic bronchitis   . Anxiety   . Fibromyalgia   . Herpes simplex type 2 infection 03/30/2016  . Hypercholesterolemia   . Hypothyroidism   . Interstitial cystitis     Past Surgical History:  Procedure Laterality Date  . LEFT HEART CATHETERIZATION WITH CORONARY ANGIOGRAM Bilateral 03/13/2014   Procedure: LEFT HEART CATHETERIZATION WITH CORONARY ANGIOGRAM;  Surgeon: Blane Ohara, MD;  Location: [facility] CATH LAB;  Service: Cardiovascular;  Laterality: Bilateral;  . TOTAL ABDOMINAL HYSTERECTOMY  1997    Family History  Problem Relation Age of Onset  . Other Mother        bronchiectasis  . Pneumonia Mother   . Heart failure Mother   . Alzheimer's disease Father   . Stroke Father   . Heart failure Father   . Pancreatic cancer Brother   . Hyperlipidemia Sister   . Other Sister        bronchiectasis    Social History   Socioeconomic History  . Marital status: Divorced    Spouse name: Not on file  . Number of children: 3  . Years of education: Not on file  . Highest education level: Not on file  Occupational History  . Occupation: Education officer, museum - substitute  teaching now  Tobacco Use  . Smoking status: Never  . Smokeless tobacco: Never  Vaping Use  . Vaping Use: Never used  Substance and Sexual Activity  . Alcohol use: Not Currently    Comment: Rare  . Drug use: No  . Sexual activity: Not on file  Other Topics Concern  . Not on file  Social History Narrative   Plays piano, 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   Declines flu and pneumonia shot 04-30-10   Social Determinants of Health   Financial Resource Strain: Low Risk  (04/14/2021)   Overall Financial Resource Strain (CARDIA)   . Difficulty of Paying Living Expenses: Not hard at all  Food Insecurity: No Food Insecurity (04/14/2021)   Hunger Vital Sign   . Worried About Charity fundraiser in the Last Year: Never true   . Ran Out of Food in the Last Year: Never true  Transportation Needs: No Transportation Needs (04/14/2021)   PRAPARE - Transportation   . Lack of Transportation (Medical): No   . Lack of Transportation (Non-Medical): No  Physical Activity: Insufficiently Active (04/14/2021)   Exercise Vital Sign   . Days of Exercise per Week: 7 days   . Minutes of Exercise per Session: 20 min  Stress: No Stress Concern Present (04/14/2021)   [name]   .  Feeling of Stress : Not at all  Social Connections: Moderately Integrated (04/14/2021)   Social Connection and Isolation Panel [NHANES]   . Frequency of Communication with Friends and Family: More than three times a week   . Frequency of Social Gatherings with Friends and Family: More than three times a week   . Attends Religious Services: More than 4 times per year   . Active Member of Clubs or Organizations: Yes   . Attends Archivist Meetings: More than 4 times per year   . Marital Status: Divorced  Human resources officer Violence: Not At Risk (04/14/2021)   Humiliation, Afraid, Rape, and Kick questionnaire   . Fear of Current or Ex-Partner: No   . Emotionally Abused: No    . Physically Abused: No   . Sexually Abused: No    Outpatient Medications Prior to Visit  Medication Sig Dispense Refill  . aspirin 81 MG tablet Take 81 mg by mouth daily.    Marland Kitchen atorvastatin (LIPITOR) 20 MG tablet Take 0.5 tablets (10 mg total) by mouth daily. 45 tablet 0  . cephALEXin (KEFLEX) 500 MG capsule Take 1 capsule (500 mg total) by mouth 2 (two) times daily for 7 days. 14 capsule 0  . Cholecalciferol (VITAMIN D) 1000 UNITS capsule Take 5,000 Units by mouth daily.    . Coconut Oil 1000 MG CAPS Take 1,000 mg by mouth daily.     . Coenzyme Q10 (COQ10) 100 MG CAPS Take 1 capsule by mouth daily.    . COLLAGEN PO Take 1 capsule by mouth daily.    . cyclobenzaprine (FLEXERIL) 10 MG tablet Take 1 tablet (10 mg total) by mouth daily. (Patient taking differently: Take 5-10 mg by mouth daily.) 90 tablet 0  . estradiol (ESTRACE) 0.5 MG tablet Take 0.25 mg by mouth every other day.     . famciclovir (FAMVIR) 250 MG tablet Take 0.5 tablets (125 mg total) by mouth daily. 15 tablet 0  . levothyroxine (SYNTHROID) 75 MCG tablet TAKE ONE TABLET BY MOUTH EVERY MORNING BEFORE BREAKFAST 90 tablet 1  . lisinopril (ZESTRIL) 2.5 MG tablet TAKE ONE (1) TABLET BY MOUTH EVERY DAY 90 tablet 3  . Multiple Vitamins-Minerals (ZINC PO) Take by mouth daily.    . NON FORMULARY Take 2 tablets by mouth daily. Bone-Up: Calcium Supplement    . ondansetron (ZOFRAN) 4 MG tablet Take 1 tablet (4 mg total) by mouth every 6 (six) hours. 12 tablet 0  . phenazopyridine (PYRIDIUM) 200 MG tablet Take 1 tablet (200 mg total) by mouth 3 (three) times daily. 6 tablet 0  . vitamin C (ASCORBIC ACID) 500 MG tablet Take 1,000 mg by mouth daily.    . vitamin E 400 UNIT capsule Take 400 Units by mouth daily.    . Zinc 50 MG TABS Take 1 tablet by mouth daily in the afternoon.     No facility-administered medications prior to visit.    Allergies  Allergen Reactions  . Iodine Anaphylaxis  . Valtrex [Valacyclovir Hcl] Other (See  Comments)    Severe nausea   . Anesthetics, Amide Nausea And Vomiting  . Benadryl Allergy [Diphenhydramine Hcl (Sleep)]     Makes patient hyper  . Diphenhydramine   . Sulfonamide Derivatives     REACTION: throat swelling and itching    Review of Systems  Constitutional:  Positive for malaise/fatigue. Negative for chills and fever.  HENT:  Negative for congestion and hearing loss.   Eyes:  Negative for discharge.  Respiratory:  Negative for cough, sputum production and shortness of breath.   Cardiovascular:  Negative for chest pain, palpitations and leg swelling.  Gastrointestinal:  Negative for abdominal pain, blood in stool, constipation, diarrhea, heartburn, nausea and vomiting.  Genitourinary:  Negative for dysuria, frequency, hematuria and urgency.  Musculoskeletal:  Negative for back pain, falls and myalgias.  Skin:  Negative for rash.  Neurological:  Negative for dizziness, sensory change, loss of consciousness, weakness and headaches.  Endo/Heme/Allergies:  Negative for environmental allergies. Does not bruise/bleed easily.  Psychiatric/Behavioral:  Negative for depression and suicidal ideas. The patient is not nervous/anxious and does not have insomnia.        Objective:    Physical Exam Nursing note reviewed.  Constitutional:      General: She is not in acute distress.    Appearance: Normal appearance. She is not ill-appearing.  HENT:     Head: Normocephalic and atraumatic.     Right Ear: External ear normal.     Left Ear: External ear normal.  Eyes:     Extraocular Movements: Extraocular movements intact.     Pupils: Pupils are equal, round, and reactive to light.  Cardiovascular:     Rate and Rhythm: Normal rate and regular rhythm.     Heart sounds: Normal heart sounds. No murmur heard.    No gallop.  Pulmonary:     Effort: Pulmonary effort is normal. No respiratory distress.     Breath sounds: Normal breath sounds. No wheezing or rales.  Skin:    General:  Skin is warm and dry.  Neurological:     Mental Status: She is alert and oriented to person, place, and time.  Psychiatric:        Judgment: Judgment normal.    There were no vitals taken for this visit. Wt Readings from Last 3 Encounters:  05/29/22 118 lb 11.2 oz (53.8 kg)  05/11/22 120 lb (54.4 kg)  02/25/22 119 lb (54 kg)       Assessment & Plan:   Problem List Items Addressed This Visit   None  No orders of the defined types were placed in this encounter.   I, Roma Schanz, personally preformed the services described in this documentation.  All medical record entries made by the scribe were at my direction and in my presence.  I have reviewed the chart and discharge instructions (if applicable) and agree that the record reflects my personal performance and is accurate and complete. 06/03/2022.   I,Verona Buck,acting as a Education administrator for [employer], DO.,have documented all relevant documentation on the behalf of [name], DO,as directed by  [name], DO while in the presence of [name], DO.    Jaclyn Bennett

## 2022-06-04 ENCOUNTER — Encounter: Payer: Self-pay | Admitting: Family Medicine

## 2022-06-04 LAB — URINALYSIS, ROUTINE W REFLEX MICROSCOPIC
Bilirubin Urine: NEGATIVE
Hgb urine dipstick: NEGATIVE
Ketones, ur: NEGATIVE
Leukocytes,Ua: NEGATIVE
Nitrite: NEGATIVE
RBC / HPF: NONE SEEN (ref 0–?)
Specific Gravity, Urine: 1.01 (ref 1.000–1.030)
Total Protein, Urine: NEGATIVE
Urine Glucose: NEGATIVE
Urobilinogen, UA: 0.2 (ref 0.0–1.0)
pH: 6.5 (ref 5.0–8.0)

## 2022-06-24 ENCOUNTER — Encounter: Payer: Self-pay | Admitting: Family Medicine

## 2022-06-24 ENCOUNTER — Ambulatory Visit (INDEPENDENT_AMBULATORY_CARE_PROVIDER_SITE_OTHER): Payer: Medicare Other | Admitting: Family Medicine

## 2022-06-24 VITALS — BP 103/40 | HR 86 | Temp 97.8°F | Resp 16 | Ht 63.0 in | Wt 119.0 lb

## 2022-06-24 DIAGNOSIS — R300 Dysuria: Secondary | ICD-10-CM

## 2022-06-24 LAB — POCT URINALYSIS DIPSTICK
Bilirubin, UA: NEGATIVE
Blood, UA: NEGATIVE
Glucose, UA: NEGATIVE
Ketones, UA: NEGATIVE
Leukocytes, UA: NEGATIVE
Nitrite, UA: NEGATIVE
Protein, UA: NEGATIVE
Spec Grav, UA: <=1.005 — AB (ref 1.010–1.025)
Urobilinogen, UA: 0.2 E.U./dL
pH, UA: 8 (ref 5.0–8.0)

## 2022-06-24 NOTE — Progress Notes (Signed)
Acute Office Visit  Subjective:     Patient ID: Jaclyn Bennett, female    DOB: 08-13-1950, 72 y.o.   MRN: [id number]  Chief Complaint  Patient presents with   possible kidney infection    HPI Patient is in today for urinary symptoms.   Patient reports that she had a UTI a few weeks ago and was treated with Keflex and Pyridium. States symptoms improved, but then she felt like she had a yeast infection, so she saw PCP and was given Diflucan (took one dose with some improvement). Now she is nervous because she is having some external discomfort near urethra (not associated with urination) and she is afraid it is going to worsen or turn into a kidney infection while she is out of town this weekend - going 4 hours away to visit her son. She has urinary frequency at baseline and has had some occasional suprapubic pressure and flank pain that improves with hydration and Azo. She denies any vaginal symptoms, fevers, chills, abdominal pain, dysuria, hematuria.    ROS All review of systems negative except what is listed in the HPI      Objective:    BP (!) 103/40   Pulse 86   Temp 97.8 F (36.6 C)   Resp 16   Ht '5\' 3"'$  (1.6 m)   Wt 119 lb (54 kg)   SpO2 100%   BMI 21.08 kg/m    Physical Exam Vitals reviewed.  Constitutional:      Appearance: Normal appearance.  Genitourinary:    Comments: Pt declined exam Neurological:     General: No focal deficit present.     Mental Status: She is alert and oriented to person, place, and time. Mental status is at baseline.  Psychiatric:        Mood and Affect: Mood normal.        Behavior: Behavior normal.        Thought Content: Thought content normal.        Judgment: Judgment normal.        Results for orders placed or performed in visit on 06/24/22  POC Urinalysis Dipstick  Result Value Ref Range   Color, UA     Clarity, UA     Glucose, UA Negative Negative   Bilirubin, UA Negative    Ketones, UA Negative    Spec  Grav, UA <=1.005 (A) 1.010 - 1.025   Blood, UA Negative    pH, UA 8.0 5.0 - 8.0   Protein, UA Negative Negative   Urobilinogen, UA 0.2 0.2 or 1.0 E.U./dL   Nitrite, UA Negative    Leukocytes, UA Negative Negative   Appearance     Odor          Assessment & Plan:   Problem List Items Addressed This Visit   None Visit Diagnoses     Dysuria    -  Primary Pain described as more external discomfort than true dysuria. She declined exam today. Recommend she try the diflucan she tried and continue with barrier cream since that seems to be helping.  UA normal in office, will send for culture and treat appropriately.  Encouraged adequate hydration. Patient aware of signs/symptoms requiring further/urgent evaluation.    Relevant Orders   POC Urinalysis Dipstick (Completed)   Urine Culture       No orders of the defined types were placed in this encounter.   Return if symptoms worsen or fail to improve.  Terrilyn Saver,  NP

## 2022-06-27 LAB — URINE CULTURE
MICRO NUMBER:: 14418970
SPECIMEN QUALITY:: ADEQUATE

## 2022-06-28 MED ORDER — CEPHALEXIN 500 MG PO CAPS: 500.0000 mg | ORAL_CAPSULE | Freq: Two times a day (BID) | ORAL | 0 refills | Status: DC

## 2022-06-28 NOTE — Addendum Note (Signed)
Addended by: Caleen Jobs B on: 06/28/2022 07:54 AM   Modules accepted: Orders

## 2022-06-29 ENCOUNTER — Encounter: Payer: Self-pay | Admitting: Family Medicine

## 2022-06-29 NOTE — Telephone Encounter (Signed)
Patient called to advise she saw test results and it showed some bacteria still.She wanted to know if she should take another antibiotic. Advised patient that [facility] sent in Keflex to the pharmacy for her. Patient will go to pharmacy.

## 2022-07-06 ENCOUNTER — Other Ambulatory Visit: Payer: Self-pay | Admitting: Family Medicine

## 2022-07-06 DIAGNOSIS — E039 Hypothyroidism, unspecified: Secondary | ICD-10-CM

## 2022-07-06 DIAGNOSIS — E785 Hyperlipidemia, unspecified: Secondary | ICD-10-CM

## 2022-08-10 DIAGNOSIS — H61021 Chronic perichondritis of right external ear: Secondary | ICD-10-CM | POA: Diagnosis not present

## 2022-08-10 DIAGNOSIS — D485 Neoplasm of uncertain behavior of skin: Secondary | ICD-10-CM | POA: Diagnosis not present
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
## 2022-09-01 ENCOUNTER — Ambulatory Visit: Payer: Medicare Other | Attending: Cardiology | Admitting: Cardiology

## 2022-09-01 ENCOUNTER — Encounter: Payer: Self-pay | Admitting: Cardiology

## 2022-09-01 VITALS — BP 108/62 | HR 82 | Ht 63.0 in | Wt 121.0 lb

## 2022-09-01 DIAGNOSIS — R0602 Shortness of breath: Secondary | ICD-10-CM | POA: Diagnosis not present

## 2022-09-01 DIAGNOSIS — I420 Dilated cardiomyopathy: Secondary | ICD-10-CM | POA: Diagnosis not present

## 2022-09-01 DIAGNOSIS — E7800 Pure hypercholesterolemia, unspecified: Secondary | ICD-10-CM

## 2022-09-01 DIAGNOSIS — R7989 Other specified abnormal findings of blood chemistry: Secondary | ICD-10-CM

## 2022-09-01 NOTE — Patient Instructions (Signed)
    Follow-Up: At [facility], you and your health needs are our priority.  As part of our continuing mission to provide you with exceptional heart care, we have created designated Provider Care Teams.  These Care Teams include your primary Cardiologist (physician) and Advanced Practice Providers (APPs -  Physician Assistants and Nurse Practitioners) who all work together to provide you with the care you need, when you need it.  We recommend signing up for the patient portal called "MyChart".  Sign up information is provided on this After Visit Summary.  MyChart is used to connect with patients for Virtual Visits (Telemedicine).  Patients are able to view lab/test results, encounter notes, upcoming appointments, etc.  Non-urgent messages can be sent to your provider as well.   To learn more about what you can do with MyChart, go to [email or web address].    Your next appointment:   12 month(s)  Provider:   Brian Crenshaw, MD     

## 2022-09-02 LAB — HEPATIC FUNCTION PANEL
ALT: 37 IU/L — ABNORMAL HIGH (ref 0–32)
AST: 33 IU/L (ref 0–40)
Albumin: 4.1 g/dL (ref 3.8–4.8)
Alkaline Phosphatase: 132 IU/L — ABNORMAL HIGH (ref 44–121)
Bilirubin Total: 0.3 mg/dL (ref 0.0–1.2)
Bilirubin, Direct: <0.1 mg/dL (ref 0.00–0.40)
Total Protein: 6.4 g/dL (ref 6.0–8.5)
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
## 2022-10-11 ENCOUNTER — Encounter: Payer: Self-pay | Admitting: *Deleted

## 2022-10-11 ENCOUNTER — Other Ambulatory Visit: Payer: Self-pay | Admitting: Family Medicine

## 2022-10-21 ENCOUNTER — Ambulatory Visit (INDEPENDENT_AMBULATORY_CARE_PROVIDER_SITE_OTHER): Payer: Medicare Other | Admitting: Family Medicine

## 2022-10-21 ENCOUNTER — Encounter: Payer: Self-pay | Admitting: Family Medicine

## 2022-10-21 VITALS — BP 110/70 | HR 82 | Temp 97.6°F | Resp 18 | Ht 63.0 in | Wt 119.4 lb

## 2022-10-21 DIAGNOSIS — B009 Herpesviral infection, unspecified: Secondary | ICD-10-CM

## 2022-10-21 DIAGNOSIS — E785 Hyperlipidemia, unspecified: Secondary | ICD-10-CM | POA: Diagnosis not present

## 2022-10-21 DIAGNOSIS — I420 Dilated cardiomyopathy: Secondary | ICD-10-CM

## 2022-10-21 DIAGNOSIS — M5412 Radiculopathy, cervical region: Secondary | ICD-10-CM | POA: Diagnosis not present

## 2022-10-21 DIAGNOSIS — E039 Hypothyroidism, unspecified: Secondary | ICD-10-CM

## 2022-10-21 MED ORDER — FAMCICLOVIR 250 MG PO TABS: 125.0000 mg | ORAL_TABLET | Freq: Every day | ORAL | 3 refills | Status: DC

## 2022-10-21 MED ORDER — CYCLOBENZAPRINE HCL 10 MG PO TABS: 10.0000 mg | ORAL_TABLET | Freq: Every day | ORAL | 1 refills | Status: DC

## 2022-10-21 NOTE — Assessment & Plan Note (Signed)
Check labs  On synthroid 

## 2022-10-21 NOTE — Assessment & Plan Note (Signed)
Encourage heart healthy diet such as MIND or DASH diet, increase exercise, avoid trans fats, simple carbohydrates and processed foods, consider a krill or fish or flaxseed oil cap daily.  °

## 2022-10-21 NOTE — Assessment & Plan Note (Signed)
Per cardiology 

## 2022-10-21 NOTE — Assessment & Plan Note (Signed)
Refill famvir

## 2022-10-21 NOTE — Progress Notes (Addendum)
Subjective:   By signing my name below, I, [name], attest that this documentation has been prepared under the direction and in the presence of Donato Schultz, DO. 10/21/2022   Patient ID: Jaclyn Bennett, female    DOB: 1951/02/09, 72 y.o.   MRN: [id number]  Chief Complaint  Patient presents with   Hyperlipidemia   Hypothyroidism   Follow-up    HPI Patient is in today for a follow up visit.   She continues following up with her cardiologist regularly.  She is requesting a refill for 10 mg Flexeril.  She has no recent issues with her urination.  She continues taking 75 mcg Synthroid daily PO and reports no new issues while taking it. She is willing to check her thyroid levels during her blood work.  Lab Results  Component Value Date   TSH 3.110 02/25/2022    Past Medical History:  Diagnosis Date   Acute asthmatic bronchitis    Anxiety    Fibromyalgia    Herpes simplex type 2 infection 03/30/2016   Hypercholesterolemia    Hypothyroidism    Interstitial cystitis     Past Surgical History:  Procedure Laterality Date   LEFT HEART CATHETERIZATION WITH CORONARY ANGIOGRAM Bilateral 03/13/2014   Procedure: LEFT HEART CATHETERIZATION WITH CORONARY ANGIOGRAM;  Surgeon: Micheline Chapman, MD;  Location: [facility] CATH LAB;  Service: Cardiovascular;  Laterality: Bilateral;   TOTAL ABDOMINAL HYSTERECTOMY  1997    Family History  Problem Relation Age of Onset   Other Mother        bronchiectasis   Pneumonia Mother    Heart failure Mother    Alzheimer's disease Father    Stroke Father    Heart failure Father    Pancreatic cancer Brother    Hyperlipidemia Sister    Other Sister        bronchiectasis    Social History   Socioeconomic History   Marital status: Divorced    Spouse name: Not on file   Number of children: 3   Years of education: Not on file   Highest education level: Bachelor's degree (e.g., BA, AB, BS)  Occupational History   Occupation: Nurse, mental health - substitute teaching now  Tobacco Use   Smoking status: Never   Smokeless tobacco: Never  Vaping Use   Vaping Use: Never used  Substance and Sexual Activity   Alcohol use: Not Currently    Comment: Rare   Drug use: No   Sexual activity: Not on file  Other Topics Concern   Not on file  Social History Narrative   Plays piano, 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   Declines flu and pneumonia shot 04-30-10   Social Determinants of Health   Financial Resource Strain: Low Risk  (10/20/2022)   Overall Financial Resource Strain (CARDIA)    Difficulty of Paying Living Expenses: Not hard at all  Food Insecurity: No Food Insecurity (10/20/2022)   Hunger Vital Sign    Worried About Running Out of Food in the Last Year: Never true    Ran Out of Food in the Last Year: Never true  Transportation Needs: No Transportation Needs (04/14/2021)   PRAPARE - Administrator, Civil Service (Medical): No    Lack of Transportation (Non-Medical): No  Physical Activity: Insufficiently Active (10/20/2022)   Exercise Vital Sign    Days of Exercise per Week: 3 days    Minutes of Exercise per Session: 40 min  Stress: No Stress Concern Present (  10/20/2022)   [employer] [employer] - Occupational Stress Questionnaire    Feeling of Stress : Not at all  Social Connections: Moderately Integrated (10/20/2022)   Social Connection and Isolation Panel [NHANES]    Frequency of Communication with Friends and Family: More than three times a week    Frequency of Social Gatherings with Friends and Family: More than three times a week    Attends Religious Services: More than 4 times per year    Active Member of [employer] or Organizations: Yes    Attends Engineer, structural: More than 4 times per year    Marital Status: Divorced  Intimate Partner Violence: Not At Risk (04/14/2021)   Humiliation, Afraid, Rape, and Kick questionnaire    Fear of Current or Ex-Partner: No    Emotionally Abused: No     Physically Abused: No    Sexually Abused: No    Outpatient Medications Prior to Visit  Medication Sig Dispense Refill   aspirin 81 MG tablet Take 81 mg by mouth daily.     atorvastatin (LIPITOR) 20 MG tablet Take 0.5 tablets (10 mg total) by mouth daily. 45 tablet 1   Cholecalciferol (VITAMIN D) 1000 UNITS capsule Take 5,000 Units by mouth daily.     Coconut Oil 1000 MG CAPS Take 1,000 mg by mouth daily.      Coenzyme Q10 (COQ10) 100 MG CAPS Take 1 capsule by mouth daily.     COLLAGEN PO Take 1 capsule by mouth daily.     estradiol (ESTRACE) 0.5 MG tablet Take 0.25 mg by mouth every other day.      levothyroxine (SYNTHROID) 75 MCG tablet Take 1 tablet (75 mcg total) by mouth daily before breakfast. 90 tablet 1   lisinopril (ZESTRIL) 2.5 MG tablet TAKE ONE (1) TABLET BY MOUTH EVERY DAY 90 tablet 3   Multiple Vitamins-Minerals (ZINC PO) Take by mouth daily.     NON FORMULARY Take 2 tablets by mouth daily. Bone-Up: Calcium Supplement     vitamin C (ASCORBIC ACID) 500 MG tablet Take 1,000 mg by mouth daily.     vitamin E 400 UNIT capsule Take 400 Units by mouth daily.     Zinc 50 MG TABS Take 1 tablet by mouth daily in the afternoon.     cyclobenzaprine (FLEXERIL) 10 MG tablet Take 1 tablet (10 mg total) by mouth daily. 90 tablet 0   famciclovir (FAMVIR) 250 MG tablet TAKE 1/2 TABLET BY MOUTH DAILY 15 tablet 0   No facility-administered medications prior to visit.    Allergies  Allergen Reactions   Iodine Anaphylaxis   Valtrex [Valacyclovir Hcl] Other (See Comments)    Severe nausea    Anesthetics, Amide Nausea And Vomiting   Benadryl Allergy [Diphenhydramine Hcl (Sleep)]     Makes patient hyper   Diphenhydramine    Sulfonamide Derivatives     REACTION: throat swelling and itching    Review of Systems  Constitutional:  Negative for fever and malaise/fatigue.  HENT:  Negative for congestion.   Eyes:  Negative for blurred vision.  Respiratory:  Negative for cough and shortness of  breath.   Cardiovascular:  Negative for chest pain, palpitations and leg swelling.  Gastrointestinal:  Negative for vomiting.  Musculoskeletal:  Negative for back pain.  Skin:  Negative for rash.  Neurological:  Negative for loss of consciousness and headaches.       Objective:    Physical Exam Vitals and nursing note reviewed.  Constitutional:  Appearance: She is well-developed.  HENT:     Head: Normocephalic and atraumatic.  Eyes:     Conjunctiva/sclera: Conjunctivae normal.  Neck:     Thyroid: No thyromegaly.     Vascular: No carotid bruit or JVD.  Cardiovascular:     Rate and Rhythm: Normal rate and regular rhythm.     Heart sounds: Normal heart sounds. No murmur heard. Pulmonary:     Effort: Pulmonary effort is normal. No respiratory distress.     Breath sounds: Normal breath sounds. No wheezing or rales.  Chest:     Chest wall: No tenderness.  Musculoskeletal:     Cervical back: Normal range of motion and neck supple.  Neurological:     Mental Status: She is alert and oriented to person, place, and time.  Psychiatric:        Mood and Affect: Mood normal.        Behavior: Behavior normal.        Thought Content: Thought content normal.        Judgment: Judgment normal.    BP 110/70 (BP Location: Left Arm, Patient Position: Sitting, Cuff Size: Normal)   Pulse 82   Temp 97.6 F (36.4 C) (Oral)   Resp 18   Ht 5\' 3"  (1.6 m)   Wt 119 lb 6.4 oz (54.2 kg)   SpO2 98%   BMI 21.15 kg/m  Wt Readings from Last 3 Encounters:  10/21/22 119 lb 6.4 oz (54.2 kg)  09/01/22 121 lb (54.9 kg)  06/24/22 119 lb (54 kg)       Assessment & Plan:  Radiculitis of left cervical region -     Cyclobenzaprine HCl; Take 1 tablet (10 mg total) by mouth daily.  Dispense: 90 tablet; Refill: 1  Hypothyroidism, unspecified type Assessment & Plan: Check labs  On synthroid   Orders: -     TSH -     TSH  Hyperlipidemia, unspecified hyperlipidemia type -     Comprehensive  metabolic panel -     Lipid panel  Congestive dilated cardiomyopathy (HCC) Assessment & Plan: Per cardiology   Herpes simplex type 2 infection Assessment & Plan: Refill famvir    Hyperlipidemia LDL goal <100 Assessment & Plan: Encourage heart healthy diet such as MIND or DASH diet, increase exercise, avoid trans fats, simple carbohydrates and processed foods, consider a krill or fish or flaxseed oil cap daily.     Other orders -     Famciclovir; Take 0.5 tablets (125 mg total) by mouth daily.  Dispense: 15 tablet; Refill: 3    I, Donato Schultz, DO, personally preformed the services described in this documentation.  All medical record entries made by the scribe were at my direction and in my presence.  I have reviewed the chart and discharge instructions (if applicable) and agree that the record reflects my personal performance and is accurate and complete. 10/21/2022   I,[name],acting as a scribe for Donato Schultz, DO.,have documented all relevant documentation on the behalf of Donato Schultz, DO,as directed by  Donato Schultz, DO while in the presence of Donato Schultz, DO.   Donato Schultz, DO

## 2022-10-21 NOTE — Patient Instructions (Signed)

## 2022-10-22 LAB — LIPID PANEL
Cholesterol: 159 mg/dL (ref 0–200)
HDL: 42.3 mg/dL (ref >39.00)
NonHDL: 116.76
Total CHOL/HDL Ratio: 4
Triglycerides: 241 mg/dL — ABNORMAL HIGH (ref 0.0–149.0)
VLDL: 48.2 mg/dL — ABNORMAL HIGH (ref 0.0–40.0)

## 2022-10-22 LAB — COMPREHENSIVE METABOLIC PANEL WITH GFR
ALT: 35 U/L (ref 0–35)
AST: 30 U/L (ref 0–37)
Albumin: 4 g/dL (ref 3.5–5.2)
Alkaline Phosphatase: 108 U/L (ref 39–117)
BUN: 20 mg/dL (ref 6–23)
CO2: 32 meq/L (ref 19–32)
Calcium: 9.4 mg/dL (ref 8.4–10.5)
Chloride: 100 meq/L (ref 96–112)
Creatinine, Ser: 0.82 mg/dL (ref 0.40–1.20)
GFR: 71.54 mL/min
Glucose, Bld: 94 mg/dL (ref 70–99)
Potassium: 4 meq/L (ref 3.5–5.1)
Sodium: 141 meq/L (ref 135–145)
Total Bilirubin: 0.3 mg/dL (ref 0.2–1.2)
Total Protein: 6.3 g/dL (ref 6.0–8.3)

## 2022-10-22 LAB — LDL CHOLESTEROL, DIRECT: Direct LDL: 89 mg/dL

## 2022-10-22 LAB — TSH: TSH: 0.54 u[IU]/mL (ref 0.35–5.50)

## 2022-11-22 ENCOUNTER — Other Ambulatory Visit: Payer: Self-pay | Admitting: Cardiology

## 2022-12-15 ENCOUNTER — Ambulatory Visit (INDEPENDENT_AMBULATORY_CARE_PROVIDER_SITE_OTHER): Payer: Medicare Other | Admitting: Family Medicine

## 2022-12-15 VITALS — BP 118/62 | HR 84 | Temp 97.6°F | Resp 18 | Ht 63.0 in | Wt 118.8 lb

## 2022-12-15 DIAGNOSIS — R300 Dysuria: Secondary | ICD-10-CM | POA: Diagnosis not present

## 2022-12-15 LAB — POCT URINALYSIS DIPSTICK
Bilirubin, UA: NEGATIVE
Blood, UA: NEGATIVE
Glucose, UA: NEGATIVE
Ketones, UA: NEGATIVE
Leukocytes, UA: NEGATIVE
Nitrite, UA: NEGATIVE
Protein, UA: NEGATIVE
Spec Grav, UA: <=1.005 — AB (ref 1.010–1.025)
Urobilinogen, UA: 0.2 E.U./dL
pH, UA: 8 (ref 5.0–8.0)

## 2022-12-15 MED ORDER — AMOXICILLIN-POT CLAVULANATE 500-125 MG PO TABS: 1.0000 | ORAL_TABLET | Freq: Two times a day (BID) | ORAL | 0 refills | Status: DC

## 2022-12-15 NOTE — Progress Notes (Addendum)
[facility] [address], [address] [place], [place] [address] [phone]  Date:  12/15/2022   Name:  Jaclyn Bennett   DOB:  Sep 21, 1950   MRN:  [id number]  PCP:  Donato Schultz, DO    Chief Complaint: possible kidney infection (R flank pain and nausea x this morning. )   History of Present Illness:  Jaclyn Bennett is a 72 y.o. very pleasant female patient who presents with the following:  Pt seen today with concern of kidney infection Pt of Dr Zola Button, I have not seen her myself previously She does have history of hyperlipidemia, hysterectomy, cardiac cath a few years ago She has not felt 100% this week - she felt a bit tired but otherwise no particular symptoms until this morning Pt notes she awoke early this am and noted a queasy feeling and some pain in her right flank area / RLQ and across her lower abdomen She was able to eat a banana and was able to eat lunch as she normally would No vomiting No fever noted  She noted some mild dysuria today No hematuria She has baseline urinary frequency- no change here  She did have a kidney infection in December- other than that it has been many years since she had a UTI  However, current symptoms do remind her of when she had a kidney infection in December  No history of diverticulitis She does still have her appendix    Patient Active Problem List   Diagnosis Date Noted   Urinary tract infection with hematuria 06/03/2022   Radiculitis of left cervical region 01/07/2021   Fatigue 08/03/2018   Hyperlipidemia LDL goal <100 10/31/2017   Pharyngitis 10/31/2017   Herpes simplex type 2 infection 03/30/2016   Congestive dilated cardiomyopathy (HCC) 10/22/2015   Nonspecific abnormal unspecified cardiovascular function study 02/26/2014   Screening for ischemic heart disease 12/23/2013   ALLERGIC RHINITIS 07/07/2009   LUMP OR MASS IN BREAST 04/05/2008    Hypothyroidism 08/25/2007   HYPERCHOLESTEROLEMIA 08/25/2007   ANXIETY 08/01/2007   INTERSTITIAL CYSTITIS 08/01/2007   Fibromyalgia 08/01/2007    Past Medical History:  Diagnosis Date   Acute asthmatic bronchitis    Anxiety    Fibromyalgia    Herpes simplex type 2 infection 03/30/2016   Hypercholesterolemia    Hypothyroidism    Interstitial cystitis     Past Surgical History:  Procedure Laterality Date   LEFT HEART CATHETERIZATION WITH CORONARY ANGIOGRAM Bilateral 03/13/2014   Procedure: LEFT HEART CATHETERIZATION WITH CORONARY ANGIOGRAM;  Surgeon: Micheline Chapman, MD;  Location: [facility] CATH LAB;  Service: Cardiovascular;  Laterality: Bilateral;   TOTAL ABDOMINAL HYSTERECTOMY  1997    Social History   Tobacco Use   Smoking status: Never   Smokeless tobacco: Never  Vaping Use   Vaping Use: Never used  Substance Use Topics   Alcohol use: Not Currently    Comment: Rare   Drug use: No    Family History  Problem Relation Age of Onset   Other Mother        bronchiectasis   Pneumonia Mother    Heart failure Mother    Alzheimer's disease Father    Stroke Father    Heart failure Father    Pancreatic cancer Brother    Hyperlipidemia Sister    Other Sister        bronchiectasis    Allergies  Allergen Reactions   Iodine  Anaphylaxis   Valtrex [Valacyclovir Hcl] Other (See Comments)    Severe nausea    Anesthetics, Amide Nausea And Vomiting   Benadryl Allergy [Diphenhydramine Hcl (Sleep)]     Makes patient hyper   Diphenhydramine    Fenofibrate     Elevated liver func Decc kidney function   Sulfonamide Derivatives     REACTION: throat swelling and itching    Medication list has been reviewed and updated.  Current Outpatient Medications on File Prior to Visit  Medication Sig Dispense Refill   aspirin 81 MG tablet Take 81 mg by mouth daily.     atorvastatin (LIPITOR) 20 MG tablet Take 0.5 tablets (10 mg total) by mouth daily. 45 tablet 1   Cholecalciferol  (VITAMIN D) 1000 UNITS capsule Take 5,000 Units by mouth daily.     Coconut Oil 1000 MG CAPS Take 1,000 mg by mouth daily.      Coenzyme Q10 (COQ10) 100 MG CAPS Take 1 capsule by mouth daily.     COLLAGEN PO Take 1 capsule by mouth daily.     cyclobenzaprine (FLEXERIL) 10 MG tablet Take 1 tablet (10 mg total) by mouth daily. 90 tablet 1   estradiol (ESTRACE) 0.5 MG tablet Take 0.25 mg by mouth every other day.      famciclovir (FAMVIR) 250 MG tablet Take 0.5 tablets (125 mg total) by mouth daily. 15 tablet 3   Flaxseed, Linseed, (FLAXSEED OIL PO) Take by mouth.     levothyroxine (SYNTHROID) 75 MCG tablet Take 1 tablet (75 mcg total) by mouth daily before breakfast. 90 tablet 1   lisinopril (ZESTRIL) 2.5 MG tablet TAKE ONE (1) TABLET BY MOUTH EVERY DAY 90 tablet 3   MAGNESIUM PO Take 70 mg by mouth daily.     Multiple Vitamins-Minerals (ZINC PO) Take by mouth daily.     NON FORMULARY Take 2 tablets by mouth daily. Bone-Up: Calcium Supplement     vitamin C (ASCORBIC ACID) 500 MG tablet Take 1,000 mg by mouth daily.     vitamin E 400 UNIT capsule Take 400 Units by mouth daily.     Zinc 50 MG TABS Take 1 tablet by mouth daily in the afternoon.     No current facility-administered medications on file prior to visit.    Review of Systems:  As per HPI- otherwise negative.   Physical Examination: Vitals:   12/15/22 1437  BP: 118/62  Pulse: 84  Resp: 18  Temp: 97.6 F (36.4 C)  SpO2: 99%   Vitals:   12/15/22 1437  Weight: 118 lb 12.8 oz (53.9 kg)  Height: 5\' 3"  (1.6 m)   Body mass index is 21.04 kg/m. Ideal Body Weight: Weight in (lb) to have BMI = 25: 140.8  GEN: no acute distress. Looks well petite build HEENT: Atraumatic, Normocephalic.  Ears and Nose: No external deformity. CV: RRR, No M/G/R. No JVD. No thrill. No extra heart sounds. PULM: CTA B, no wheezes, crackles, rhonchi. No retractions. No resp. distress. No accessory muscle use. ABD: S,  ND, +BS. No rebound. No HSM.   Very minimal tenderness across the entire lower abdomen, perhaps more pronounced over the bladder EXTR: No c/c/e PSYCH: Normally interactive. Conversant.   Results for orders placed or performed in visit on 12/15/22  Urine Culture   Specimen: Urine  Result Value Ref Range   MICRO NUMBER: [id number]    SPECIMEN QUALITY: Adequate    Sample Source URINE    STATUS: FINAL    Result:  Less than 10,000 CFU/mL of single Gram negative organism isolated. No further testing will be performed. If clinically indicated, recollection using a method to minimize contamination, with prompt transfer to Urine Culture Transport Tube, is recommended.  CBC  Result Value Ref Range   WBC 7.5 3.8 - 10.8 Thousand/uL   RBC 4.38 3.80 - 5.10 Million/uL   Hemoglobin 13.6 11.7 - 15.5 g/dL   HCT 32.9 51.8 - 84.1 %   MCV 91.1 80.0 - 100.0 fL   MCH 31.1 27.0 - 33.0 pg   MCHC 34.1 32.0 - 36.0 g/dL   RDW 66.0 63.0 - 16.0 %   Platelets 359 140 - 400 Thousand/uL   MPV 9.0 7.5 - 12.5 fL  Comprehensive metabolic panel  Result Value Ref Range   Glucose, Bld 121 (H) 65 - 99 mg/dL   BUN 18 7 - 25 mg/dL   Creat 1.09 3.23 - 5.57 mg/dL   BUN/Creatinine Ratio SEE NOTE: 6 - 22 (calc)   Sodium 139 135 - 146 mmol/L   Potassium 4.1 3.5 - 5.3 mmol/L   Chloride 100 98 - 110 mmol/L   CO2 30 20 - 32 mmol/L   Calcium 9.7 8.6 - 10.4 mg/dL   Total Protein 6.7 6.1 - 8.1 g/dL   Albumin 4.4 3.6 - 5.1 g/dL   Globulin 2.3 1.9 - 3.7 g/dL (calc)   AG Ratio 1.9 1.0 - 2.5 (calc)   Total Bilirubin 0.4 0.2 - 1.2 mg/dL   Alkaline phosphatase (APISO) 113 37 - 153 U/L   AST 25 10 - 35 U/L   ALT 26 6 - 29 U/L  POCT Urinalysis Dipstick  Result Value Ref Range   Color, UA yellow    Clarity, UA clear    Glucose, UA Negative Negative   Bilirubin, UA negative    Ketones, UA negative    Spec Grav, UA <=1.005 (A) 1.010 - 1.025   Blood, UA negative    pH, UA 8.0 5.0 - 8.0   Protein, UA Negative Negative   Urobilinogen, UA 0.2 0.2 or 1.0  E.U./dL   Nitrite, UA negative    Leukocytes, UA Negative Negative   Appearance clear    Odor none     Assessment and Plan: Dysuria - Plan: POCT Urinalysis Dipstick, CBC, Comprehensive metabolic panel, Urine Culture, amoxicillin-clavulanate (AUGMENTIN) 500-125 MG tablet  Patient seen today with concern of possible UTI.  She notes some vague lower abdominal discomfort which reminds her of when she had a kidney infection about 6 months ago.  Urinalysis is benign, urine culture is pending.  Discussed in detail with patient.  A UTI certainly possible, also consider colitis, diverticulitis, appendicitis.  We discussed potentially doing a CT scan, at this time her symptoms are quite mild and she does not feel this is necessary.  We will obtain blood work as above and treat presumptively for UTI while we await urine culture I did advise her to seek care right away should her symptoms worsen Signed Abbe Amsterdam, MD  Addendum 7/4, received labs as below.  Message to patient  Results for orders placed or performed in visit on 12/15/22  Urine Culture   Specimen: Urine  Result Value Ref Range   MICRO NUMBER: [id number]    SPECIMEN QUALITY: Adequate    Sample Source URINE    STATUS: FINAL    Result:      Less than 10,000 CFU/mL of single Gram negative organism isolated. No further testing will be performed. If clinically indicated,  recollection using a method to minimize contamination, with prompt transfer to Urine Culture Transport Tube, is recommended.  CBC  Result Value Ref Range   WBC 7.5 3.8 - 10.8 Thousand/uL   RBC 4.38 3.80 - 5.10 Million/uL   Hemoglobin 13.6 11.7 - 15.5 g/dL   HCT 29.5 62.1 - 30.8 %   MCV 91.1 80.0 - 100.0 fL   MCH 31.1 27.0 - 33.0 pg   MCHC 34.1 32.0 - 36.0 g/dL   RDW 65.7 84.6 - 96.2 %   Platelets 359 140 - 400 Thousand/uL   MPV 9.0 7.5 - 12.5 fL  Comprehensive metabolic panel  Result Value Ref Range   Glucose, Bld 121 (H) 65 - 99 mg/dL   BUN 18 7 - 25 mg/dL    Creat 9.52 8.41 - 3.24 mg/dL   BUN/Creatinine Ratio SEE NOTE: 6 - 22 (calc)   Sodium 139 135 - 146 mmol/L   Potassium 4.1 3.5 - 5.3 mmol/L   Chloride 100 98 - 110 mmol/L   CO2 30 20 - 32 mmol/L   Calcium 9.7 8.6 - 10.4 mg/dL   Total Protein 6.7 6.1 - 8.1 g/dL   Albumin 4.4 3.6 - 5.1 g/dL   Globulin 2.3 1.9 - 3.7 g/dL (calc)   AG Ratio 1.9 1.0 - 2.5 (calc)   Total Bilirubin 0.4 0.2 - 1.2 mg/dL   Alkaline phosphatase (APISO) 113 37 - 153 U/L   AST 25 10 - 35 U/L   ALT 26 6 - 29 U/L  POCT Urinalysis Dipstick  Result Value Ref Range   Color, UA yellow    Clarity, UA clear    Glucose, UA Negative Negative   Bilirubin, UA negative    Ketones, UA negative    Spec Grav, UA <=1.005 (A) 1.010 - 1.025   Blood, UA negative    pH, UA 8.0 5.0 - 8.0   Protein, UA Negative Negative   Urobilinogen, UA 0.2 0.2 or 1.0 E.U./dL   Nitrite, UA negative    Leukocytes, UA Negative Negative   Appearance clear    Odor none    Addnd 7/5- received urine culture - message to pt MICRO NUMBER: [id number]  SPECIMEN QUALITY: Adequate  Sample Source URINE  STATUS: FINAL  Result: Less than 10,000 CFU/mL of single Gram negative organism isolated. No further testing will be performed. If clinically indicated, recollection using a method to minimize contamination, with prompt transfer to Urine Culture Transport Tube, is recommended.

## 2022-12-15 NOTE — Patient Instructions (Addendum)
I will be in touch with your urine culture and your labs asap  IF things are getting worse please seek care right away - go to the ER if worsening pain or vomiting, fever develop For now we will treat you for presumed urinary trace infection with augmentin twice a day for 5 days

## 2022-12-16 ENCOUNTER — Encounter: Payer: Self-pay | Admitting: Family Medicine

## 2022-12-16 LAB — CBC
HCT: 39.9 % (ref 35.0–45.0)
Hemoglobin: 13.6 g/dL (ref 11.7–15.5)
MCH: 31.1 pg (ref 27.0–33.0)
MCHC: 34.1 g/dL (ref 32.0–36.0)
MCV: 91.1 fL (ref 80.0–100.0)
MPV: 9 fL (ref 7.5–12.5)
Platelets: 359 10*3/uL (ref 140–400)
RBC: 4.38 10*6/uL (ref 3.80–5.10)
RDW: 11.5 % (ref 11.0–15.0)
WBC: 7.5 10*3/uL (ref 3.8–10.8)

## 2022-12-16 LAB — COMPREHENSIVE METABOLIC PANEL
AG Ratio: 1.9 (calc) (ref 1.0–2.5)
ALT: 26 U/L (ref 6–29)
AST: 25 U/L (ref 10–35)
Albumin: 4.4 g/dL (ref 3.6–5.1)
Alkaline phosphatase (APISO): 113 U/L (ref 37–153)
BUN: 18 mg/dL (ref 7–25)
CO2: 30 mmol/L (ref 20–32)
Calcium: 9.7 mg/dL (ref 8.6–10.4)
Chloride: 100 mmol/L (ref 98–110)
Creat: 0.84 mg/dL (ref 0.60–1.00)
Globulin: 2.3 g/dL (calc) (ref 1.9–3.7)
Glucose, Bld: 121 mg/dL — ABNORMAL HIGH (ref 65–99)
Potassium: 4.1 mmol/L (ref 3.5–5.3)
Sodium: 139 mmol/L (ref 135–146)
Total Bilirubin: 0.4 mg/dL (ref 0.2–1.2)
Total Protein: 6.7 g/dL (ref 6.1–8.1)

## 2022-12-16 LAB — URINE CULTURE
MICRO NUMBER:: 15158856
SPECIMEN QUALITY:: ADEQUATE

## 2022-12-17 ENCOUNTER — Encounter: Payer: Self-pay | Admitting: Family Medicine

## 2023-01-03 ENCOUNTER — Ambulatory Visit: Payer: Self-pay | Admitting: Licensed Clinical Social Worker

## 2023-01-03 NOTE — Patient Outreach (Signed)
  Care Coordination  Initial Visit Note   01/03/2023 Name: Jaclyn Bennett MRN: [id number] DOB: Jun 21, 1950  Jaclyn Bennett is a 72 y.o. year old female who sees Zola Button, Grayling Congress, DO for primary care. I spoke with  Hettie Holstein by phone today.  What matters to the patients health and wellness today?    Patient scheduled phone appointment to obtain additional information about the Care Coordination Program..   SDOH assessments and interventions completed:  No  Care Coordination Interventions:  No, not indicated   Follow up plan: Follow up call scheduled for 01/11/23    Encounter Outcome:  Pt. Visit Completed   Sammuel Hines, LCSW Social Work Care Coordination  [facility]iemann [employer] [phone]

## 2023-01-03 NOTE — Patient Instructions (Signed)
  It was a pleasure speaking with you today. Per your request a Care Coordination phone appointment is scheduled 01/11/23  Sammuel Hines, LCSW Social Work Care Coordination  [phone]

## 2023-01-06 ENCOUNTER — Other Ambulatory Visit: Payer: Self-pay | Admitting: Family Medicine

## 2023-01-06 DIAGNOSIS — E039 Hypothyroidism, unspecified: Secondary | ICD-10-CM

## 2023-01-11 ENCOUNTER — Ambulatory Visit: Payer: Self-pay | Admitting: Licensed Clinical Social Worker

## 2023-01-11 NOTE — Patient Outreach (Signed)
  Care Coordination  Initial Visit Note   01/11/2023 Name: Jaclyn Bennett MRN: [id number] DOB: 27-Mar-1951  Jaclyn Bennett is a 72 y.o. year old female who sees Jaclyn Bennett, Jaclyn Congress, DO for primary care. I spoke with  Jaclyn Bennett by phone today.  What matters to the patients health and wellness today?    Patient reports no concerns or needs from Care Coordination team with health and wellness related to physical or mental heath. .   SDOH assessments and interventions completed:  Yes  SDOH Interventions Today    Flowsheet Row Most Recent Value  SDOH Interventions   Food Insecurity Interventions Intervention Not Indicated  Housing Interventions Intervention Not Indicated  Transportation Interventions Intervention Not Indicated  Utilities Interventions Intervention Not Indicated  Financial Strain Interventions Intervention Not Indicated  Stress Interventions Intervention Not Indicated  Social Connections Interventions Intervention Not Indicated  Health Literacy Interventions Intervention Not Indicated       Care Coordination Interventions:  Yes, provided  Interventions Today    Flowsheet Row Most Recent Value  General Interventions   General Interventions Discussed/Reviewed General Interventions Discussed  [Care Coordination Services]  Education Interventions   Education Provided Provided Education  Mental Health Interventions   Mental Health Discussed/Reviewed Mental Health Reviewed  Pharmacy Interventions   Pharmacy Dicussed/Reviewed Pharmacy Topics Reviewed  Advanced Directive Interventions   Advanced Directives Discussed/Reviewed Advanced Directives Discussed  Jaclyn Bennett documents will bring copies to the office]       Follow up plan: No further intervention required.   Encounter Outcome:  Pt. Visit Completed   Jaclyn Hines, LCSW Social Work Care Coordination  [facility] Jaclyn Bennett [employer] [phone]

## 2023-01-11 NOTE — Patient Instructions (Signed)
Social Work Visit Information  Thank you for taking time to visit with me today. Please don't hesitate to contact me if I can be of assistance to you.    Care Coordination provides support specific to your health needs that extend beyond exceptional routine office care you already receive from your primary care doctor.   There is no cost to you for Care Coordination.  The Care Coordination team is made up of the following team members: Registered Nurse Care Manager: disease management, health education, care coordination and complex case management Clinical Social Work: Complex Care Coordination including coordination of level of care needs, mental and behavioral health assessment and recommendations, and connection to long-term mental health support Clinical Pharmacist: medication management, assistance and disease management Community Resource Care Guides: community resource connections  Please call [phone] if you would like to schedule a phone appointment with one of the team members.    If you or anyone you know are experiencing a Mental Health or Behavioral Health Crisis or need someone to talk to, please call the Suicide and Crisis Lifeline: 988 call the [place] National Suicide Prevention Lifeline: 1-800-273-8255 or TTY: 1-800-799-4 TTY (1-800-799-4889) to talk to a trained counselor call 1-800-273-TALK (toll free, 24 hour hotline) go to [facility] [address], [place] ([phone])   Patient verbalizes understanding of instructions and care plan provided today and agrees to view in MyChart. Active MyChart status and patient understanding of how to access instructions and care plan via MyChart confirmed with patient.      Deborah Moore, LCSW Social Work Care Coordination   /[facility] [phone]     

## 2023-01-12 ENCOUNTER — Encounter (INDEPENDENT_AMBULATORY_CARE_PROVIDER_SITE_OTHER): Payer: Medicare Other | Admitting: Family Medicine

## 2023-01-12 DIAGNOSIS — B3731 Acute candidiasis of vulva and vagina: Secondary | ICD-10-CM | POA: Diagnosis not present

## 2023-01-12 MED ORDER — FLUCONAZOLE 150 MG PO TABS: 150.0000 mg | ORAL_TABLET | Freq: Once | ORAL | 0 refills | Status: AC

## 2023-01-12 NOTE — Telephone Encounter (Signed)

## 2023-01-12 NOTE — Telephone Encounter (Signed)
Lowne pt- looks like you've seen her only once. Are you okay with this?

## 2023-02-01 ENCOUNTER — Other Ambulatory Visit: Payer: Self-pay | Admitting: Family Medicine

## 2023-02-01 DIAGNOSIS — E785 Hyperlipidemia, unspecified: Secondary | ICD-10-CM

## 2023-02-08 DIAGNOSIS — H2513 Age-related nuclear cataract, bilateral: Secondary | ICD-10-CM | POA: Diagnosis not present

## 2023-03-04 ENCOUNTER — Other Ambulatory Visit: Payer: Self-pay | Admitting: Obstetrics and Gynecology

## 2023-03-04 DIAGNOSIS — Z1231 Encounter for screening mammogram for malignant neoplasm of breast: Secondary | ICD-10-CM

## 2023-03-07 ENCOUNTER — Telehealth: Payer: Self-pay | Admitting: Family Medicine

## 2023-03-07 NOTE — Telephone Encounter (Signed)
Pt called to ask if Dr. Laury Axon could order her Dexa Scan to be done at the following practice so she can get her MM done at the same time:  [facility]

## 2023-03-11 NOTE — Telephone Encounter (Signed)
Noted. Imaging scheduled for 04/26/23

## 2023-04-26 ENCOUNTER — Ambulatory Visit
Admission: RE | Admit: 2023-04-26 | Discharge: 2023-04-26 | Disposition: A | Discharge status: Home / Self Care | Payer: Medicare Other | Source: Ambulatory Visit | Attending: Obstetrics and Gynecology | Admitting: Obstetrics and Gynecology

## 2023-04-26 DIAGNOSIS — Z1231 Encounter for screening mammogram for malignant neoplasm of breast: Secondary | ICD-10-CM | POA: Diagnosis not present

## 2023-04-28 ENCOUNTER — Encounter: Payer: Self-pay | Admitting: Family Medicine

## 2023-04-28 ENCOUNTER — Ambulatory Visit: Payer: Medicare Other | Admitting: Family Medicine

## 2023-04-28 VITALS — BP 106/80 | HR 82 | Temp 98.1°F | Resp 18 | Ht 63.0 in | Wt 120.2 lb

## 2023-04-28 DIAGNOSIS — M5412 Radiculopathy, cervical region: Secondary | ICD-10-CM | POA: Diagnosis not present

## 2023-04-28 DIAGNOSIS — E2839 Other primary ovarian failure: Secondary | ICD-10-CM

## 2023-04-28 DIAGNOSIS — Z0000 Encounter for general adult medical examination without abnormal findings: Secondary | ICD-10-CM

## 2023-04-28 DIAGNOSIS — E039 Hypothyroidism, unspecified: Secondary | ICD-10-CM | POA: Diagnosis not present

## 2023-04-28 DIAGNOSIS — E785 Hyperlipidemia, unspecified: Secondary | ICD-10-CM | POA: Diagnosis not present

## 2023-04-28 MED ORDER — CYCLOBENZAPRINE HCL 10 MG PO TABS: 10.0000 mg | ORAL_TABLET | Freq: Every day | ORAL | 1 refills | Status: DC

## 2023-04-28 MED ORDER — LEVOTHYROXINE SODIUM 75 MCG PO TABS: 75.0000 ug | ORAL_TABLET | Freq: Every day | ORAL | 1 refills | Status: DC

## 2023-04-28 NOTE — Progress Notes (Signed)
Established Patient Office Visit  Subjective   Patient ID: Jaclyn Bennett, female    DOB: 14-Jan-1951  Age: 72 y.o. MRN: [id number]  Chief Complaint  Patient presents with   Annual Exam    HPI Discussed the use of AI scribe software for clinical note transcription with the patient, who gave verbal consent to proceed.  History of Present Illness   The patient, with a history of left ventricular dysfunction, presented for a routine physical and prescription refills. She also requested an order for a bone density test, as she was unable to schedule one at her usual location due to high demand. The patient reported that a home nurse had recently performed a foot examination, the results of which were described as "borderline." The patient also brought in her living will and health care power of attorney documents for review.  The patient reported no recent chest pain, but did note that activities such as climbing stairs significantly drained her energy. She speculated that this could be related to her known heart condition. The patient also mentioned a recent visit to the eye doctor, who is monitoring her cataracts.  The patient has been managing her health conditions with regular medical appointments and tests. She recently completed a colonoscopy kit and had a mammogram. She has not had a pneumonia or flu shot since the mid-nineties. The patient also mentioned a family history of health issues, including a daughter who passed away unexpectedly in May 10, 2016 due to multiple health problems.      Patient Active Problem List   Diagnosis Date Noted   Urinary tract infection with hematuria 06/03/2022   Radiculitis of left cervical region 01/07/2021   Fatigue 08/03/2018   Hyperlipidemia LDL goal <100 10/31/2017   Pharyngitis 10/31/2017   Herpes simplex type 2 infection 03/30/2016   Congestive dilated cardiomyopathy (HCC) 10/22/2015   Nonspecific abnormal  results of cardiovascular function study 02/26/2014   Screening for ischemic heart disease 12/23/2013   ALLERGIC RHINITIS 07/07/2009   LUMP OR MASS IN BREAST 04/05/2008   Hypothyroidism 08/25/2007   HYPERCHOLESTEROLEMIA 08/25/2007   ANXIETY 08/01/2007   INTERSTITIAL CYSTITIS 08/01/2007   Fibromyalgia 08/01/2007   Past Medical History:  Diagnosis Date   Acute asthmatic bronchitis    Anxiety    Fibromyalgia    Herpes simplex type 2 infection 03/30/2016   Hypercholesterolemia    Hypothyroidism    Interstitial cystitis    Past Surgical History:  Procedure Laterality Date   LEFT HEART CATHETERIZATION WITH CORONARY ANGIOGRAM Bilateral 03/13/2014   Procedure: LEFT HEART CATHETERIZATION WITH CORONARY ANGIOGRAM;  Surgeon: Micheline Chapman, MD;  Location: [facility] CATH LAB;  Service: Cardiovascular;  Laterality: Bilateral;   TOTAL ABDOMINAL HYSTERECTOMY  1997   Social History   Tobacco Use   Smoking status: Never   Smokeless tobacco: Never  Vaping Use   Vaping status: Never Used  Substance Use Topics   Alcohol use: Not Currently    Comment: Rare   Drug use: No   Social History   Socioeconomic History   Marital status: Divorced    Spouse name: Not on file   Number of children: 3   Years of education: Not on file   Highest education level: Bachelor's degree (e.g., BA, AB, BS)  Occupational History   Occupation: school teacher - substitute teaching now  Tobacco Use   Smoking status: Never  Smokeless tobacco: Never  Vaping Use   Vaping status: Never Used  Substance and Sexual Activity   Alcohol use: Not Currently    Comment: Rare   Drug use: No   Sexual activity: Not on file  Other Topics Concern   Not on file  Social History Narrative   Plays piano, 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   Declines flu and pneumonia shot 04-30-10   Social Determinants of Health   Financial Resource Strain: Low Risk  (01/11/2023)   Overall Financial Resource Strain (CARDIA)    Difficulty of Paying Living  Expenses: Not hard at all  Food Insecurity: No Food Insecurity (01/11/2023)   Hunger Vital Sign    Worried About Running Out of Food in the Last Year: Never true    Ran Out of Food in the Last Year: Never true  Transportation Needs: No Transportation Needs (01/11/2023)   PRAPARE - Administrator, Civil Service (Medical): No    Lack of Transportation (Non-Medical): No  Physical Activity: Insufficiently Active (10/20/2022)   Exercise Vital Sign    Days of Exercise per Week: 3 days    Minutes of Exercise per Session: 40 min  Stress: No Stress Concern Present (01/11/2023)   [employer] of Occupational Health - Occupational Stress Questionnaire    Feeling of Stress : Not at all  Social Connections: Moderately Integrated (01/11/2023)   Social Connection and Isolation Panel [NHANES]    Frequency of Communication with Friends and Family: More than three times a week    Frequency of Social Gatherings with Friends and Family: More than three times a week    Attends Religious Services: More than 4 times per year    Active Member of [employer] or Organizations: Yes    Attends Engineer, structural: More than 4 times per year    Marital Status: Divorced  Intimate Partner Violence: Not At Risk (04/14/2021)   Humiliation, Afraid, Rape, and Kick questionnaire    Fear of Current or Ex-Partner: No    Emotionally Abused: No    Physically Abused: No    Sexually Abused: No   Family Status  Relation Name Status   Mother  Deceased at age 59   Father  Deceased at age 48   Brother  (Not Specified)   Sister  (Not Specified)   Sister  (Not Specified)   MGM  Deceased   MGF  Deceased   PGM  Deceased   PGF  Deceased  No partnership data on file   Family History  Problem Relation Age of Onset   Other Mother        bronchiectasis   Pneumonia Mother    Heart failure Mother    Alzheimer's disease Father    Stroke Father    Heart failure Father    Pancreatic cancer Brother     Hyperlipidemia Sister    Other Sister        bronchiectasis   Allergies  Allergen Reactions   Iodine Anaphylaxis   Valtrex [Valacyclovir Hcl] Other (See Comments)    Severe nausea    Anesthetics, Amide Nausea And Vomiting   Benadryl Allergy [Diphenhydramine Hcl (Sleep)]     Makes patient hyper   Diphenhydramine    Fenofibrate     Elevated liver func Decc kidney function   Sulfonamide Derivatives     REACTION: throat swelling and itching      Review of Systems  Constitutional:  Negative for chills, fever and malaise/fatigue.  HENT:  Negative for congestion and hearing loss.   Eyes:  Negative for blurred vision and discharge.  Respiratory:  Negative for cough, sputum production and shortness of breath.   Cardiovascular:  Negative for chest pain, palpitations and leg swelling.  Gastrointestinal:  Negative for abdominal pain, blood in stool, constipation, diarrhea, heartburn, nausea and vomiting.  Genitourinary:  Negative for dysuria, frequency, hematuria and urgency.  Musculoskeletal:  Negative for back pain, falls and myalgias.  Skin:  Negative for rash.  Neurological:  Negative for dizziness, sensory change, loss of consciousness, weakness and headaches.  Endo/Heme/Allergies:  Negative for environmental allergies. Does not bruise/bleed easily.  Psychiatric/Behavioral:  Negative for depression and suicidal ideas. The patient is not nervous/anxious and does not have insomnia.       Objective:     BP 106/80 (BP Location: Left Arm, Patient Position: Sitting, Cuff Size: Normal)   Pulse 82   Temp 98.1 F (36.7 C) (Oral)   Resp 18   Ht 5\' 3"  (1.6 m)   Wt 120 lb 3.2 oz (54.5 kg)   SpO2 98%   BMI 21.29 kg/m  BP Readings from Last 3 Encounters:  04/28/23 106/80  12/15/22 118/62  10/21/22 110/70   Wt Readings from Last 3 Encounters:  04/28/23 120 lb 3.2 oz (54.5 kg)  12/15/22 118 lb 12.8 oz (53.9 kg)  10/21/22 119 lb 6.4 oz (54.2 kg)   SpO2 Readings from Last 3  Encounters:  04/28/23 98%  12/15/22 99%  10/21/22 98%      Physical Exam Vitals and nursing note reviewed.  Constitutional:      General: She is not in acute distress.    Appearance: Normal appearance. She is well-developed.  HENT:     Head: Normocephalic and atraumatic.     Right Ear: Tympanic membrane, ear canal and external ear normal. There is no impacted cerumen.     Left Ear: Tympanic membrane, ear canal and external ear normal. There is no impacted cerumen.     Nose: Nose normal.     Mouth/Throat:     Mouth: Mucous membranes are moist.     Pharynx: Oropharynx is clear. No oropharyngeal exudate or posterior oropharyngeal erythema.  Eyes:     General: No scleral icterus.       Right eye: No discharge.        Left eye: No discharge.     Conjunctiva/sclera: Conjunctivae normal.     Pupils: Pupils are equal, round, and reactive to light.  Neck:     Thyroid: No thyromegaly or thyroid tenderness.     Vascular: No JVD.  Cardiovascular:     Rate and Rhythm: Normal rate and regular rhythm.     Heart sounds: Normal heart sounds. No murmur heard. Pulmonary:     Effort: Pulmonary effort is normal. No respiratory distress.     Breath sounds: Normal breath sounds.  Abdominal:     General: Bowel sounds are normal. There is no distension.     Palpations: Abdomen is soft. There is no mass.     Tenderness: There is no abdominal tenderness. There is no guarding or rebound.  Genitourinary:    Vagina: Normal.  Musculoskeletal:        General: Normal range of motion.     Cervical back: Normal range of motion and neck supple.     Right lower leg: No edema.     Left lower leg: No edema.  Lymphadenopathy:     Cervical: No cervical adenopathy.  Skin:  General: Skin is warm and dry.     Findings: No erythema or rash.  Neurological:     Mental Status: She is alert and oriented to person, place, and time.     Cranial Nerves: No cranial nerve deficit.     Deep Tendon Reflexes:  Reflexes are normal and symmetric.  Psychiatric:        Mood and Affect: Mood normal.        Behavior: Behavior normal.        Thought Content: Thought content normal.        Judgment: Judgment normal.      No results found for any visits on 04/28/23.  Last CBC Lab Results  Component Value Date   WBC 7.5 12/15/2022   HGB 13.6 12/15/2022   HCT 39.9 12/15/2022   MCV 91.1 12/15/2022   MCH 31.1 12/15/2022   RDW 11.5 12/15/2022   PLT 359 12/15/2022   Last metabolic panel Lab Results  Component Value Date   GLUCOSE 121 (H) 12/15/2022   NA 139 12/15/2022   K 4.1 12/15/2022   CL 100 12/15/2022   CO2 30 12/15/2022   BUN 18 12/15/2022   CREATININE 0.84 12/15/2022   GFR 71.54 10/21/2022   CALCIUM 9.7 12/15/2022   PROT 6.7 12/15/2022   ALBUMIN 4.0 10/21/2022   LABGLOB 2.2 02/25/2022   AGRATIO 2.1 02/25/2022   BILITOT 0.4 12/15/2022   ALKPHOS 108 10/21/2022   AST 25 12/15/2022   ALT 26 12/15/2022   Last lipids Lab Results  Component Value Date   CHOL 159 10/21/2022   HDL 42.30 10/21/2022   LDLCALC 97 02/25/2022   LDLDIRECT 89.0 10/21/2022   TRIG 241.0 (H) 10/21/2022   CHOLHDL 4 10/21/2022   Last hemoglobin A1c Lab Results  Component Value Date   HGBA1C 5.8 08/12/2016   Last thyroid functions Lab Results  Component Value Date   TSH 0.54 10/21/2022   Last vitamin D Lab Results  Component Value Date   VD25OH 44.94 12/24/2014   Last vitamin B12 and Folate Lab Results  Component Value Date   VITAMINB12 > 1500 pg/mL (H) 08/18/2007   FOLATE > 20.0 ng/mL 08/18/2007      The 10-year ASCVD risk score (Arnett DK, et al., 2019) is: 10.7%    Assessment & Plan:   Problem List Items Addressed This Visit       Unprioritized   Radiculitis of left cervical region   Relevant Medications   cyclobenzaprine (FLEXERIL) 10 MG tablet   Hypothyroidism   Relevant Medications   levothyroxine (SYNTHROID) 75 MCG tablet   Other Relevant Orders   CBC with  Differential/Platelet   Comprehensive metabolic panel   TSH   Other Visit Diagnoses     Hyperlipidemia, unspecified hyperlipidemia type    -  Primary   Relevant Orders   CBC with Differential/Platelet   Lipid panel   Estrogen deficiency       Relevant Orders   DG Bone Density     Assessment and Plan    Heart Murmur A slight heart murmur was detected upon auscultation, with left ventricular relaxation issues noted. Despite these findings, a September ultrasound by a cardiologist showed her heart in good condition. She experiences fatigue with exertion, which may be cardiac-related. We will monitor for increased symptoms such as dyspnea or chest pain and advise her to contact her cardiologist if symptoms worsen before her March appointment.  General Health Maintenance During her routine physical examination, we discussed vaccinations, screenings,  and lifestyle modifications. She declined pneumonia and flu shots. A recent mammogram was performed, and a Cologuard test kit was sent off yesterday. She maintains regular follow-ups with her ophthalmologist and dentist. We will order a bone density test, review mammogram results, review Cologuard test results, perform labs today, and encourage regular follow-ups with her ophthalmologist and dentist.  Advance Directives She brought her living will and healthcare power of attorney documents for review, which were not signed or notarized. We discussed her preferences for organ donation (eyes and corneas only) and her refusal of ECT. We advise returning to her attorney for proper signing and notarization and will make copies of the signed documents for her medical records.  Follow-up We will schedule a bone density test and follow up with her cardiologist in March unless symptoms worsen. She is to return the signed advance directive documents to our office.        Return in about 1 year (around 04/27/2024), or if symptoms worsen or fail to  improve, for annual exam.    Donato Schultz, DO

## 2023-04-28 NOTE — Patient Instructions (Signed)
Preventive Care 65 Years and Older, Female Preventive care refers to lifestyle choices and visits with your health care provider that can promote health and wellness. Preventive care visits are also called wellness exams. What can I expect for my preventive care visit? Counseling Your health care provider may ask you questions about your: Medical history, including: Past medical problems. Family medical history. Pregnancy and menstrual history. History of falls. Current health, including: Memory and ability to understand (cognition). Emotional well-being. Home life and relationship well-being. Sexual activity and sexual health. Lifestyle, including: Alcohol, nicotine or tobacco, and drug use. Access to firearms. Diet, exercise, and sleep habits. Work and work environment. Sunscreen use. Safety issues such as seatbelt and bike helmet use. Physical exam Your health care provider will check your: Height and weight. These may be used to calculate your BMI (body mass index). BMI is a measurement that tells if you are at a healthy weight. Waist circumference. This measures the distance around your waistline. This measurement also tells if you are at a healthy weight and may help predict your risk of certain diseases, such as type 2 diabetes and high blood pressure. Heart rate and blood pressure. Body temperature. Skin for abnormal spots. What immunizations do I need?  Vaccines are usually given at various ages, according to a schedule. Your health care provider will recommend vaccines for you based on your age, medical history, and lifestyle or other factors, such as travel or where you work. What tests do I need? Screening Your health care provider may recommend screening tests for certain conditions. This may include: Lipid and cholesterol levels. Hepatitis C test. Hepatitis B test. HIV (human immunodeficiency virus) test. STI (sexually transmitted infection) testing, if you are at  risk. Lung cancer screening. Colorectal cancer screening. Diabetes screening. This is done by checking your blood sugar (glucose) after you have not eaten for a while (fasting). Mammogram. Talk with your health care provider about how often you should have regular mammograms. BRCA-related cancer screening. This may be done if you have a family history of breast, ovarian, tubal, or peritoneal cancers. Bone density scan. This is done to screen for osteoporosis. Talk with your health care provider about your test results, treatment options, and if necessary, the need for more tests. Follow these instructions at home: Eating and drinking  Eat a diet that includes fresh fruits and vegetables, whole grains, lean protein, and low-fat dairy products. Limit your intake of foods with high amounts of sugar, saturated fats, and salt. Take vitamin and mineral supplements as recommended by your health care provider. Do not drink alcohol if your health care provider tells you not to drink. If you drink alcohol: Limit how much you have to 0-1 drink a day. Know how much alcohol is in your drink. In the [place] one drink equals one 12 oz bottle of beer (355 mL), one 5 oz glass of wine (148 mL), or one 1 oz glass of hard liquor (44 mL). Lifestyle Brush your teeth every morning and night with fluoride toothpaste. Floss one time each day. Exercise for at least 30 minutes 5 or more days each week. Do not use any products that contain nicotine or tobacco. These products include cigarettes, chewing tobacco, and vaping devices, such as e-cigarettes. If you need help quitting, ask your health care provider. Do not use drugs. If you are sexually active, practice safe sex. Use a condom or other form of protection in order to prevent STIs. Take aspirin only as told by   your health care provider. Make sure that you understand how much to take and what form to take. Work with your health care provider to find out whether it  is safe and beneficial for you to take aspirin daily. Ask your health care provider if you need to take a cholesterol-lowering medicine (statin). Find healthy ways to manage stress, such as: Meditation, yoga, or listening to music. Journaling. Talking to a trusted person. Spending time with friends and family. Minimize exposure to UV radiation to reduce your risk of skin cancer. Safety Always wear your seat belt while driving or riding in a vehicle. Do not drive: If you have been drinking alcohol. Do not ride with someone who has been drinking. When you are tired or distracted. While texting. If you have been using any mind-altering substances or drugs. Wear a helmet and other protective equipment during sports activities. If you have firearms in your house, make sure you follow all gun safety procedures. What's next? Visit your health care provider once a year for an annual wellness visit. Ask your health care provider how often you should have your eyes and teeth checked. Stay up to date on all vaccines. This information is not intended to replace advice given to you by your health care provider. Make sure you discuss any questions you have with your health care provider. Document Revised: 11/26/2020 Document Reviewed: 11/26/2020 Elsevier Patient Education  2024 [employer]  

## 2023-04-29 LAB — COMPREHENSIVE METABOLIC PANEL
ALT: 27 U/L (ref 0–35)
AST: 24 U/L (ref 0–37)
Albumin: 4.2 g/dL (ref 3.5–5.2)
Alkaline Phosphatase: 114 U/L (ref 39–117)
BUN: 22 mg/dL (ref 6–23)
CO2: 32 meq/L (ref 19–32)
Calcium: 9.7 mg/dL (ref 8.4–10.5)
Chloride: 101 meq/L (ref 96–112)
Creatinine, Ser: 0.84 mg/dL (ref 0.40–1.20)
GFR: 69.25 mL/min (ref >60.00)
Glucose, Bld: 113 mg/dL — ABNORMAL HIGH (ref 70–99)
Potassium: 4.2 meq/L (ref 3.5–5.1)
Sodium: 141 meq/L (ref 135–145)
Total Bilirubin: 0.3 mg/dL (ref 0.2–1.2)
Total Protein: 6.5 g/dL (ref 6.0–8.3)

## 2023-04-29 LAB — LIPID PANEL
Cholesterol: 164 mg/dL (ref 0–200)
HDL: 44.5 mg/dL (ref >39.00)
LDL Cholesterol: 69 mg/dL (ref 0–99)
NonHDL: 119.6
Total CHOL/HDL Ratio: 4
Triglycerides: 254 mg/dL — ABNORMAL HIGH (ref 0.0–149.0)
VLDL: 50.8 mg/dL — ABNORMAL HIGH (ref 0.0–40.0)

## 2023-04-29 LAB — CBC WITH DIFFERENTIAL/PLATELET
Basophils Absolute: 0 10*3/uL (ref 0.0–0.1)
Basophils Relative: 0.6 % (ref 0.0–3.0)
Eosinophils Absolute: 0.1 10*3/uL (ref 0.0–0.7)
Eosinophils Relative: 1.9 % (ref 0.0–5.0)
HCT: 41.5 % (ref 36.0–46.0)
Hemoglobin: 13.8 g/dL (ref 12.0–15.0)
Lymphocytes Relative: 25 % (ref 12.0–46.0)
Lymphs Abs: 1.4 10*3/uL (ref 0.7–4.0)
MCHC: 33.2 g/dL (ref 30.0–36.0)
MCV: 94.5 fL (ref 78.0–100.0)
Monocytes Absolute: 0.5 10*3/uL (ref 0.1–1.0)
Monocytes Relative: 9.5 % (ref 3.0–12.0)
Neutro Abs: 3.6 10*3/uL (ref 1.4–7.7)
Neutrophils Relative %: 63 % (ref 43.0–77.0)
Platelets: 361 10*3/uL (ref 150.0–400.0)
RBC: 4.39 Mil/uL (ref 3.87–5.11)
RDW: 13 % (ref 11.5–15.5)
WBC: 5.8 10*3/uL (ref 4.0–10.5)

## 2023-04-29 LAB — TSH: TSH: 0.38 u[IU]/mL (ref 0.35–5.50)

## 2023-05-05 ENCOUNTER — Ambulatory Visit (HOSPITAL_BASED_OUTPATIENT_CLINIC_OR_DEPARTMENT_OTHER)
Admission: RE | Admit: 2023-05-05 | Discharge: 2023-05-05 | Disposition: A | Discharge status: Home / Self Care | Payer: Medicare Other | Source: Ambulatory Visit | Attending: Family Medicine | Admitting: Family Medicine

## 2023-05-05 DIAGNOSIS — E2839 Other primary ovarian failure: Secondary | ICD-10-CM | POA: Diagnosis present

## 2023-05-05 DIAGNOSIS — Z780 Asymptomatic menopausal state: Secondary | ICD-10-CM | POA: Diagnosis not present

## 2023-05-05 DIAGNOSIS — M8589 Other specified disorders of bone density and structure, multiple sites: Secondary | ICD-10-CM | POA: Diagnosis not present

## 2023-05-23 ENCOUNTER — Ambulatory Visit (INDEPENDENT_AMBULATORY_CARE_PROVIDER_SITE_OTHER): Payer: Medicare Other | Admitting: *Deleted

## 2023-05-23 DIAGNOSIS — Z0000 Encounter for general adult medical examination without abnormal findings: Secondary | ICD-10-CM | POA: Diagnosis not present

## 2023-05-23 NOTE — Progress Notes (Signed)
Subjective:   Jaclyn Bennett is a 72 y.o. female who presents for Medicare Annual (Subsequent) preventive examination.  Visit Complete: Virtual I connected with  Hettie Holstein on 05/23/23 by a audio enabled telemedicine application and verified that I am speaking with the correct person using two identifiers.  Patient Location: Home  Provider Location: Office/Clinic  I discussed the limitations of evaluation and management by telemedicine. The patient expressed understanding and agreed to proceed.  Vital Signs: Because this visit was a virtual/telehealth visit, some criteria may be missing or patient reported. Any vitals not documented were not able to be obtained and vitals that have been documented are patient reported.  Patient Medicare AWV questionnaire was completed by the patient on 05/16/23; I have confirmed that all information answered by patient is correct and no changes since this date.  Cardiac Risk Factors include: advanced age (>63men, >90 women);dyslipidemia     Objective:    There were no vitals filed for this visit. There is no height or weight on file to calculate BMI.     05/23/2023    2:20 PM 05/29/2022    9:49 AM 04/14/2021    1:47 PM 04/04/2020    2:12 PM 05/06/2019    8:55 PM 02/15/2017    2:46 PM  Advanced Directives  Does Patient Have a Medical Advance Directive? Yes No Yes Yes No Yes  Type of Estate agent of [place];Living will  Healthcare Power of [place];Living will   Living will;Healthcare Power of Attorney  Copy of Healthcare Power of Attorney in Chart? No - copy requested  No - copy requested   No - copy requested  Would patient like information on creating a medical advance directive?     No - Patient declined     Current Medications (verified) Outpatient Encounter Medications as of 05/23/2023  Medication Sig   aspirin 81 MG tablet Take 81 mg by mouth daily.   atorvastatin (LIPITOR) 20 MG tablet Take 0.5  tablets (10 mg total) by mouth daily.   Cholecalciferol (VITAMIN D) 1000 UNITS capsule Take 5,000 Units by mouth daily.   Coconut Oil 1000 MG CAPS Take 1,000 mg by mouth daily.    Coenzyme Q10 (COQ10) 100 MG CAPS Take 1 capsule by mouth daily.   COLLAGEN PO Take 1 capsule by mouth daily.   cyclobenzaprine (FLEXERIL) 10 MG tablet Take 1 tablet (10 mg total) by mouth daily.   estradiol (ESTRACE) 0.5 MG tablet Take 0.25 mg by mouth every other day.    famciclovir (FAMVIR) 250 MG tablet Take 0.5 tablets (125 mg total) by mouth daily.   Flaxseed, Linseed, (FLAXSEED OIL PO) Take by mouth.   levothyroxine (SYNTHROID) 75 MCG tablet Take 1 tablet (75 mcg total) by mouth daily before breakfast.   lisinopril (ZESTRIL) 2.5 MG tablet TAKE ONE (1) TABLET BY MOUTH EVERY DAY   MAGNESIUM PO Take 70 mg by mouth daily.   Multiple Vitamins-Minerals (ZINC PO) Take by mouth daily.   NON FORMULARY Take 2 tablets by mouth daily. Bone-Up: Calcium Supplement   vitamin C (ASCORBIC ACID) 500 MG tablet Take 1,000 mg by mouth daily.   vitamin E 400 UNIT capsule Take 400 Units by mouth daily.   Zinc 50 MG TABS Take 1 tablet by mouth daily in the afternoon.   No facility-administered encounter medications on file as of 05/23/2023.    Allergies (verified) Iodine; Valtrex [valacyclovir hcl]; Anesthetics, amide; Benadryl allergy [diphenhydramine hcl (sleep)]; Diphenhydramine; Fenofibrate; and Sulfonamide  derivatives   History: Past Medical History:  Diagnosis Date   Acute asthmatic bronchitis    Allergy    Anxiety    Cataract    Fibromyalgia    Herpes simplex type 2 infection 03/30/2016   Hypercholesterolemia    Hypothyroidism    Interstitial cystitis    Past Surgical History:  Procedure Laterality Date   LEFT HEART CATHETERIZATION WITH CORONARY ANGIOGRAM Bilateral 03/13/2014   Procedure: LEFT HEART CATHETERIZATION WITH CORONARY ANGIOGRAM;  Surgeon: Micheline Chapman, MD;  Location[facility] C[facility] LAB;  Service:  Cardiovascular;  Laterality: Bilateral;   TOTAL ABDOMINAL HYSTERECTOMY  1997   Family History  Problem Relation Age of Onset   Other Mother        bronchiectasis   Pneumonia Mother    Heart failure Mother    Alzheimer's disease Father    Stroke Father    Heart failure Father    Pancreatic cancer Brother    Hyperlipidemia Sister    Other Sister        bronchiectasis   Social History   Socioeconomic History   Marital status: Divorced    Spouse name: Not on file   Number of children: 3   Years of education: Not on file   Highest education level: Bachelor's degree (e.g., BA, AB, BS)  Occupational History   Occupation: Engineer, site - substitute teaching now  Tobacco Use   Smoking status: Never   Smokeless tobacco: Never  Vaping Use   Vaping status: Never Used  Substance and Sexual Activity   Alcohol use: Not Currently    Comment: Rare   Drug use: No   Sexual activity: Not Currently    Birth control/protection: Abstinence  Other Topics Concern   Not on file  Social History Narrative   Plays piano, 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   Declines flu and pneumonia shot 04-30-10   Social Determinants of Health   Financial Resource Strain: Low Risk  (05/16/2023)   Overall Financial Resource Strain (CARDIA)    Difficulty of Paying Living Expenses: Not hard at all  Food Insecurity: No Food Insecurity (05/16/2023)   Hunger Vital Sign    Worried About Running Out of Food in the Last Year: Never true    Ran Out of Food in the Last Year: Never true  Transportation Needs: No Transportation Needs (05/16/2023)   PRAPARE - Administrator, Civil Service (Medical): No    Lack of Transportation (Non-Medical): No  Physical Activity: Insufficiently Active (05/16/2023)   Exercise Vital Sign    Days of Exercise per Week: 3 days    Minutes of Exercise per Session: 40 min  Stress: No Stress Concern Present (05/16/2023)   [employer] of Occupational Health - Occupational Stress  Questionnaire    Feeling of Stress : Not at all  Social Connections: Moderately Integrated (05/16/2023)   Social Connection and Isolation Panel [NHANES]    Frequency of Communication with Friends and Family: More than three times a week    Frequency of Social Gatherings with Friends and Family: Three times a week    Attends Religious Services: More than 4 times per year    Active Member of Clubs or Organizations: Yes    Attends Engineer, structural: More than 4 times per year    Marital Status: Divorced    Tobacco Counseling Counseling given: Not Answered   Clinical Intake:  Pre-visit preparation completed: Yes  Pain : No/denies pain  Nutritional Risks: None Diabetes: No  How often do you need to have someone help you when you read instructions, pamphlets, or other written materials from your doctor or pharmacy?: 1 - Never  Interpreter Needed?: No  Information entered by :: Donne Anon, CMA   Activities of Daily Living    05/16/2023    3:30 PM  In your present state of health, do you have any difficulty performing the following activities:  Hearing? 0  Vision? 0  Difficulty concentrating or making decisions? 0  Walking or climbing stairs? 0  Dressing or bathing? 0  Doing errands, shopping? 0  Preparing Food and eating ? N  Using the Toilet? N  In the past six months, have you accidently leaked urine? N  Do you have problems with loss of bowel control? N  Managing your Medications? N  Managing your Finances? N  Housekeeping or managing your Housekeeping? N    Patient Care Team: Zola Button, Grayling Congress, DO as PCP - General (Family Medicine) Jens Som Madolyn Frieze, MD as PCP - Cardiology (Cardiology) Michele Mcalpine, MD as Consulting Physician (Pulmonary Disease) Lewayne Bunting, MD as Consulting Physician (Cardiology) Huel Cote, MD as Consulting Physician (Obstetrics and Gynecology) Alfredo Martinez, MD as Consulting Physician (Urology)  Indicate  any recent Medical Services you may have received from other than [facility] providers in the past year (date may be approximate).     Assessment:   This is a routine wellness examination for [facility].  Hearing/Vision screen No results found.   Goals Addressed   None    Depression Screen    05/23/2023    2:32 PM 05/11/2022   11:03 AM 04/14/2021    1:51 PM 09/23/2020   10:51 AM 08/03/2018   11:32 AM 02/15/2017    2:47 PM 02/15/2017    2:09 PM  PHQ 2/9 Scores  PHQ - 2 Score 0 0 0 0 0 0 0  PHQ- 9 Score     1  0    Fall Risk    05/16/2023    3:30 PM 10/21/2022    2:23 PM 05/11/2022   11:03 AM 05/07/2022    1:55 PM 04/14/2021    1:49 PM  Fall Risk   Falls in the past year? 0 0 0 0 0  Number falls in past yr: 0 0 0  0  Injury with Fall? 0 0 0 0 0  Risk for fall due to : No Fall Risks      Follow up Falls evaluation completed Falls evaluation completed Falls evaluation completed  Falls prevention discussed    MEDICARE RISK AT HOME: Medicare Risk at Home Any stairs in or around the home?: Yes If so, are there any without handrails?: No Home free of loose throw rugs in walkways, pet beds, electrical cords, etc?: Yes Adequate lighting in your home to reduce risk of falls?: Yes Life alert?: No Use of a cane, walker or w/c?: No Grab bars in the bathroom?: No Shower chair or bench in shower?: Yes Elevated toilet seat or a handicapped toilet?: No  TIMED UP AND GO:  Was the test performed?  No    Cognitive Function:        05/23/2023    2:37 PM  6CIT Screen  What Year? 0 points  What month? 0 points  What time? 0 points  Count back from 20 0 points  Months in reverse 0 points  Repeat phrase 0 points  Total Score 0 points    Immunizations Immunization History  Administered Date(s) Administered   Tdap 12/03/2010, 01/12/2022    TDAP status: Up to date  Flu Vaccine status: Due, Education has been provided regarding the importance of this vaccine. Advised may receive this  vaccine at local pharmacy or Health Dept. Aware to provide a copy of the vaccination record if obtained from local pharmacy or Health Dept. Verbalized acceptance and understanding.  Pneumococcal vaccine status: Due, Education has been provided regarding the importance of this vaccine. Advised may receive this vaccine at local pharmacy or Health Dept. Aware to provide a copy of the vaccination record if obtained from local pharmacy or Health Dept. Verbalized acceptance and understanding.  Covid-19 vaccine status: Information provided on how to obtain vaccines.   Qualifies for Shingles Vaccine? Yes   Zostavax completed No   Shingrix Completed?: No.    Education has been provided regarding the importance of this vaccine. Patient has been advised to call insurance company to determine out of pocket expense if they have not yet received this vaccine. Advised may also receive vaccine at local pharmacy or Health Dept. Verbalized acceptance and understanding.  Screening Tests Health Maintenance  Topic Date Due   Fecal DNA (Cologuard)  Never done   Medicare Annual Wellness (AWV)  05/12/2023   INFLUENZA VACCINE  09/12/2023 (Originally 01/13/2023)   Pneumonia Vaccine 82+ Years old (1 of 1 - PCV) 04/27/2024 (Originally 07/18/2015)   MAMMOGRAM  04/25/2025   DEXA SCAN  05/04/2025   DTaP/Tdap/Td (3 - Td or Tdap) 01/13/2032   Hepatitis C Screening  Completed   HPV VACCINES  Aged Out   Colonoscopy  Discontinued   COVID-19 Vaccine  Discontinued   Zoster Vaccines- Shingrix  Discontinued    Health Maintenance  Health Maintenance Due  Topic Date Due   Fecal DNA (Cologuard)  Never done   Medicare Annual Wellness (AWV)  05/12/2023    Colorectal cancer screening: No longer required.   Mammogram status: Completed 04/26/23. Repeat every year  Bone Density status: Completed 05/05/23. Results reflect: Bone density results: OSTEOPENIA. Repeat every 2 years.  Lung Cancer Screening: (Low Dose CT Chest  recommended if Age 66-80 years, 20 pack-year currently smoking OR have quit w/in 15years.) does not qualify.   Additional Screening:  Hepatitis C Screening: does qualify; Completed 12/01/21  Vision Screening: Recommended annual ophthalmology exams for early detection of glaucoma and other disorders of the eye. Is the patient up to date with their annual eye exam?  Yes  Who is the provider or what is the name of the office in which the patient attends annual eye exams? Dr. Arville Lime If pt is not established with a provider, would they like to be referred to a provider to establish care? No .   Dental Screening: Recommended annual dental exams for proper oral hygiene  Diabetic Foot Exam: N/a  Community Resource Referral / Chronic Care Management: CRR required this visit?  No   CCM required this visit?  No     Plan:     I have personally reviewed and noted the following in the patient's chart:   Medical and social history Use of alcohol, tobacco or illicit drugs  Current medications and supplements including opioid prescriptions. Patient is not currently taking opioid prescriptions. Functional ability and status Nutritional status Physical activity Advanced directives List of other physicians Hospitalizations, surgeries, and ER visits in previous 12 months Vitals Screenings to include cognitive, depression, and falls Referrals and appointments  In addition, I have reviewed and discussed with patient certain preventive  protocols, quality metrics, and best practice recommendations. A written personalized care plan for preventive services as well as general preventive health recommendations were provided to patient.     Donne Anon, CMA   05/23/2023   After Visit Summary: (MyChart) Due to this being a telephonic visit, the after visit summary with patients personalized plan was offered to patient via MyChart   Nurse Notes: None

## 2023-05-23 NOTE — Patient Instructions (Signed)
Ms. Ziebell , Thank you for taking time to come for your Medicare Wellness Visit. I appreciate your ongoing commitment to your health goals. Please review the following plan we discussed and let me know if I can assist you in the future.     This is a list of the screening recommended for you and due dates:  Health Maintenance  Topic Date Due   Cologuard (Stool DNA test)  Never done   Flu Shot  09/12/2023*   Pneumonia Vaccine (1 of 1 - PCV) 04/27/2024*   Medicare Annual Wellness Visit  05/22/2024   Mammogram  04/25/2025   DEXA scan (bone density measurement)  05/04/2025   DTaP/Tdap/Td vaccine (3 - Td or Tdap) 01/13/2032   Hepatitis C Screening  Completed   HPV Vaccine  Aged Out   Colon Cancer Screening  Discontinued   COVID-19 Vaccine  Discontinued   Zoster (Shingles) Vaccine  Discontinued  *Topic was postponed. The date shown is not the original due date.    Next appointment: Follow up in one year for your annual wellness visit.   Preventive Care 50 Years and Older, Female Preventive care refers to lifestyle choices and visits with your health care provider that can promote health and wellness. What does preventive care include? A yearly physical exam. This is also called an annual well check. Dental exams once or twice a year. Routine eye exams. Ask your health care provider how often you should have your eyes checked. Personal lifestyle choices, including: Daily care of your teeth and gums. Regular physical activity. Eating a healthy diet. Avoiding tobacco and drug use. Limiting alcohol use. Practicing safe sex. Taking low-dose aspirin every day. Taking vitamin and mineral supplements as recommended by your health care provider. What happens during an annual well check? The services and screenings done by your health care provider during your annual well check will depend on your age, overall health, lifestyle risk factors, and family history of disease. Counseling   Your health care provider may ask you questions about your: Alcohol use. Tobacco use. Drug use. Emotional well-being. Home and relationship well-being. Sexual activity. Eating habits. History of falls. Memory and ability to understand (cognition). Work and work Astronomer. Reproductive health. Screening  You may have the following tests or measurements: Height, weight, and BMI. Blood pressure. Lipid and cholesterol levels. These may be checked every 5 years, or more frequently if you are over 64 years old. Skin check. Lung cancer screening. You may have this screening every year starting at age 47 if you have a 30-pack-year history of smoking and currently smoke or have quit within the past 15 years. Fecal occult blood test (FOBT) of the stool. You may have this test every year starting at age 47. Flexible sigmoidoscopy or colonoscopy. You may have a sigmoidoscopy every 5 years or a colonoscopy every 10 years starting at age 61. Hepatitis C blood test. Hepatitis B blood test. Sexually transmitted disease (STD) testing. Diabetes screening. This is done by checking your blood sugar (glucose) after you have not eaten for a while (fasting). You may have this done every 1-3 years. Bone density scan. This is done to screen for osteoporosis. You may have this done starting at age 74. Mammogram. This may be done every 1-2 years. Talk to your health care provider about how often you should have regular mammograms. Talk with your health care provider about your test results, treatment options, and if necessary, the need for more tests. Vaccines  Your health  care provider may recommend certain vaccines, such as: Influenza vaccine. This is recommended every year. Tetanus, diphtheria, and acellular pertussis (Tdap, Td) vaccine. You may need a Td booster every 10 years. Zoster vaccine. You may need this after age 8. Pneumococcal 13-valent conjugate (PCV13) vaccine. One dose is recommended  after age 7. Pneumococcal polysaccharide (PPSV23) vaccine. One dose is recommended after age 45. Talk to your health care provider about which screenings and vaccines you need and how often you need them. This information is not intended to replace advice given to you by your health care provider. Make sure you discuss any questions you have with your health care provider. Document Released: 06/27/2015 Document Revised: 02/18/2016 Document Reviewed: 04/01/2015 Elsevier Interactive Patient Education  2017 [employer].  Fall Prevention in the Home Falls can cause injuries. They can happen to people of all ages. There are many things you can do to make your home safe and to help prevent falls. What can I do on the outside of my home? Regularly fix the edges of walkways and driveways and fix any cracks. Remove anything that might make you trip as you walk through a door, such as a raised step or threshold. Trim any bushes or trees on the path to your home. Use bright outdoor lighting. Clear any walking paths of anything that might make someone trip, such as rocks or tools. Regularly check to see if handrails are loose or broken. Make sure that both sides of any steps have handrails. Any raised decks and porches should have guardrails on the edges. Have any leaves, snow, or ice cleared regularly. Use sand or salt on walking paths during winter. Clean up any spills in your garage right away. This includes oil or grease spills. What can I do in the bathroom? Use night lights. Install grab bars by the toilet and in the tub and shower. Do not use towel bars as grab bars. Use non-skid mats or decals in the tub or shower. If you need to sit down in the shower, use a plastic, non-slip stool. Keep the floor dry. Clean up any water that spills on the floor as soon as it happens. Remove soap buildup in the tub or shower regularly. Attach bath mats securely with double-sided non-slip rug tape. Do not  have throw rugs and other things on the floor that can make you trip. What can I do in the bedroom? Use night lights. Make sure that you have a light by your bed that is easy to reach. Do not use any sheets or blankets that are too big for your bed. They should not hang down onto the floor. Have a firm chair that has side arms. You can use this for support while you get dressed. Do not have throw rugs and other things on the floor that can make you trip. What can I do in the kitchen? Clean up any spills right away. Avoid walking on wet floors. Keep items that you use a lot in easy-to-reach places. If you need to reach something above you, use a strong step stool that has a grab bar. Keep electrical cords out of the way. Do not use floor polish or wax that makes floors slippery. If you must use wax, use non-skid floor wax. Do not have throw rugs and other things on the floor that can make you trip. What can I do with my stairs? Do not leave any items on the stairs. Make sure that there are handrails on  both sides of the stairs and use them. Fix handrails that are broken or loose. Make sure that handrails are as long as the stairways. Check any carpeting to make sure that it is firmly attached to the stairs. Fix any carpet that is loose or worn. Avoid having throw rugs at the top or bottom of the stairs. If you do have throw rugs, attach them to the floor with carpet tape. Make sure that you have a light switch at the top of the stairs and the bottom of the stairs. If you do not have them, ask someone to add them for you. What else can I do to help prevent falls? Wear shoes that: Do not have high heels. Have rubber bottoms. Are comfortable and fit you well. Are closed at the toe. Do not wear sandals. If you use a stepladder: Make sure that it is fully opened. Do not climb a closed stepladder. Make sure that both sides of the stepladder are locked into place. Ask someone to hold it for  you, if possible. Clearly mark and make sure that you can see: Any grab bars or handrails. First and last steps. Where the edge of each step is. Use tools that help you move around (mobility aids) if they are needed. These include: Canes. Walkers. Scooters. Crutches. Turn on the lights when you go into a dark area. Replace any light bulbs as soon as they burn out. Set up your furniture so you have a clear path. Avoid moving your furniture around. If any of your floors are uneven, fix them. If there are any pets around you, be aware of where they are. Review your medicines with your doctor. Some medicines can make you feel dizzy. This can increase your chance of falling. Ask your doctor what other things that you can do to help prevent falls. This information is not intended to replace advice given to you by your health care provider. Make sure you discuss any questions you have with your health care provider. Document Released: 03/27/2009 Document Revised: 11/06/2015 Document Reviewed: 07/05/2014 Elsevier Interactive Patient Education  2017 [employer].

## 2023-06-06 ENCOUNTER — Encounter: Payer: Self-pay | Admitting: Pharmacist

## 2023-06-06 NOTE — Progress Notes (Signed)
Pharmacy Quality Measure Review  This patient is appearing on a report for being at risk of failing the adherence measure for cholesterol (statin) medications this calendar year.   Medication: atorvastatin 20mg  - take 0.5 tablet daily Last fill date: 02/01/2023 for 90 day supply.   Reviewed recent refill history in Epic and [facility] database - patient filled 90 DS (#45) atorvastatin on 05/19/2023.   Insurance report was not up to date. No action needed at this time.    Henrene Pastor, PharmD Clinical Pharmacist [facility]

## 2023-07-21 ENCOUNTER — Ambulatory Visit: Payer: Medicare Other

## 2023-07-21 ENCOUNTER — Ambulatory Visit (INDEPENDENT_AMBULATORY_CARE_PROVIDER_SITE_OTHER): Payer: Medicare Other | Admitting: Sports Medicine

## 2023-07-21 DIAGNOSIS — M25522 Pain in left elbow: Secondary | ICD-10-CM | POA: Diagnosis not present

## 2023-07-21 DIAGNOSIS — M7712 Lateral epicondylitis, left elbow: Secondary | ICD-10-CM

## 2023-07-21 NOTE — Progress Notes (Signed)
    Procedures performed today:    None.  Independent interpretation of notes and tests performed by another provider:   None.  Brief History, Exam, Impression, and Recommendations:    Left lateral epicondylitis This is an exquisitely pleasant 73 year old female, she has had several months of pain that she localizes directly over the lateral elbow. It radiates to the mid forearm. On exam she does have discrete tenderness at the common extensor tendon origin, she has reproduction of pain with resisted extension of the middle finger. Suspected subtle, we discussed the anatomy and pathophysiology, we will do a counterforce brace, x-rays, home physical therapy. She will do this consistently for 4 to 6 weeks, and if insufficient improvement we will do an ultrasound-guided injection.    ____________________________________________ Debby PARAS. Curtis, M.D., ABFM., CAQSM., AME. Primary Care and Sports Medicine [facility]  Adjunct Professor of [facility] Medicine  [employer]  Restaurant Manager, [employer]

## 2023-07-21 NOTE — Assessment & Plan Note (Addendum)
 This is an exquisitely pleasant 73 year old female, she has had several months of pain that she localizes directly over the lateral elbow. It radiates to the mid forearm. On exam she does have discrete tenderness at the common extensor tendon origin, she has reproduction of pain with resisted extension of the middle finger. Suspected subtle, we discussed the anatomy and pathophysiology, we will do a counterforce brace, x-rays, home physical therapy. She will do this consistently for 4 to 6 weeks, and if insufficient improvement we will do an ultrasound-guided injection.

## 2023-07-22 ENCOUNTER — Telehealth: Payer: Self-pay

## 2023-07-22 NOTE — Telephone Encounter (Signed)
 Patient informed.

## 2023-07-22 NOTE — Telephone Encounter (Signed)
 Patient informed of directions for placement of brace. She states it is placed correctly but she feels it is causing a new aching pain above the elbow. She states she plays piano daily and wonders if this could be the cause of the symptoms. 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 She wanted to know your thoughts about this.

## 2023-07-22 NOTE — Telephone Encounter (Signed)
 Copied from CRM [id number]. Topic: Clinical - Medication Question >> Jul 22, 2023 11:06 AM Antwanette L wrote: Reason for CRM: Patient saw Dr. Gustavo Petties yesterday and had an xray done. Patient wants to know, should she wear her arm brace below the elbow? Patient can be contacted at [phone]

## 2023-07-22 NOTE — Telephone Encounter (Signed)
 I am fairly certain that Lyle San put it on her but yes, it is supposed to be on snug approximately 1 thumb width distal to the lateral epicondyle with the cushion in the line between the lateral epicondyle and the middle finger.

## 2023-07-22 NOTE — Telephone Encounter (Signed)
 I suppose she can try to loosen it a bit.

## 2023-08-29 ENCOUNTER — Other Ambulatory Visit: Payer: Self-pay | Admitting: Family Medicine

## 2023-08-29 DIAGNOSIS — E785 Hyperlipidemia, unspecified: Secondary | ICD-10-CM

## 2023-09-05 NOTE — Progress Notes (Unsigned)
 Cardiology Clinic Note   Patient Name: Jaclyn Bennett Date of Encounter: 09/06/2023  Primary Care Provider:  Zola Button, Grayling Congress, DO Primary Cardiologist:  Olga Millers, MD  Patient Profile    Jaclyn Bennett 73 year old female presents to the clinic today for follow-up evaluation of her dilated cardiomyopathy and hyperlipidemia.  Past Medical History    Past Medical History:  Diagnosis Date   Acute asthmatic bronchitis    Allergy    Anxiety    Cataract    Fibromyalgia    Herpes simplex type 2 infection 03/30/2016   Hypercholesterolemia    Hypothyroidism    Interstitial cystitis    Past Surgical History:  Procedure Laterality Date   LEFT HEART CATHETERIZATION WITH CORONARY ANGIOGRAM Bilateral 03/13/2014   Procedure: LEFT HEART CATHETERIZATION WITH CORONARY ANGIOGRAM;  Surgeon: Micheline Chapman, MD;  Location: [facility] CATH LAB;  Service: Cardiovascular;  Laterality: Bilateral;   TOTAL ABDOMINAL HYSTERECTOMY  1997    Allergies  Allergies  Allergen Reactions   Iodine Anaphylaxis   Valtrex [Valacyclovir Hcl] Other (See Comments)    Severe nausea    Anesthetics, Amide Nausea And Vomiting   Benadryl Allergy [Diphenhydramine Hcl (Sleep)]     Makes patient hyper   Diphenhydramine    Fenofibrate     Elevated liver func Decc kidney function   Sulfonamide Derivatives     REACTION: throat swelling and itching    History of Present Illness    Jaclyn Bennett has a PMH of fibromyalgia, anxiety, hyperlipidemia, fatigue, pharyngitis, allergic rhinitis, and dilated cardiomyopathy.  She was previously seen and evaluated for atypical chest pain.  She underwent stress echo 9/15 which showed an EF of 45%.  She was noted to have inferior, septal, and apical hypokinesis both at rest and with stress.  She underwent cardiac cath 9/15 which showed normal coronary arteries.  Her echocardiogram 9/23 showed normal LV function, G1 DD.  She was last seen by Dr. Jens Som  09/01/2022.  During that time she denied chest pain, palpitations, syncope, and dyspnea.  She presents to the clinic today for follow-up evaluation and states she continues to be very active at church.  She does note occasional episodes of chest discomfort that come on at rest and are brief.  She denies exertional chest discomfort.  She also notes that she was very tired on Monday when she woke up.  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  We reviewed how increased fatigue after increased activity was normal.  She continues to be very involved with her family as well.  Her blood pressure today is 128/62.  Her EKG is unchanged.  I will provide her with a new EKG and refill her lisinopril.  Will plan follow-up in 9 months.  Today she denies chest pain, shortness of breath, lower extremity edema, fatigue, palpitations, melena, hematuria, hemoptysis, diaphoresis, weakness, presyncope, syncope, orthopnea, and PND.   Home Medications    Prior to Admission medications   Medication Sig Start Date End Date Taking? Authorizing Provider  aspirin 81 MG tablet Take 81 mg by mouth daily.    [provider]  atorvastatin (LIPITOR) 20 MG tablet TAKE 0.5 TABLETS (10 MG TOTAL) BY MOUTH DAILY. 08/29/23   Donato Schultz, DO  Cholecalciferol (VITAMIN D) 1000 UNITS capsule Take 5,000 Units by mouth daily.    [provider]  Coconut Oil 1000 MG CAPS Take  1,000 mg by mouth daily.     [provider]  Coenzyme Q10 (COQ10) 100 MG CAPS Take 1 capsule by mouth daily.    [provider]  COLLAGEN PO Take 1 capsule by mouth daily.    [provider]  cyclobenzaprine (FLEXERIL) 10 MG tablet Take 1 tablet (10 mg total) by mouth daily. 04/28/23   Donato Schultz, DO  estradiol (ESTRACE) 0.5 MG tablet Take 0.25 mg by mouth every other day.     [provider]  famciclovir (FAMVIR) 250 MG  tablet Take 0.5 tablets (125 mg total) by mouth daily. 10/21/22   Seabron Spates R, DO  Flaxseed, Linseed, (FLAXSEED OIL PO) Take by mouth.    [provider]  levothyroxine (SYNTHROID) 75 MCG tablet Take 1 tablet (75 mcg total) by mouth daily before breakfast. 04/28/23   Zola Button, Grayling Congress, DO  lisinopril (ZESTRIL) 2.5 MG tablet TAKE ONE (1) TABLET BY MOUTH EVERY DAY 11/22/22   Lewayne Bunting, MD  MAGNESIUM PO Take 70 mg by mouth daily.    [provider]  Multiple Vitamins-Minerals (ZINC PO) Take by mouth daily.    [provider]  NON FORMULARY Take 2 tablets by mouth daily. Bone-Up: Calcium Supplement    [provider]  vitamin C (ASCORBIC ACID) 500 MG tablet Take 1,000 mg by mouth daily.    [provider]  vitamin E 400 UNIT capsule Take 400 Units by mouth daily.    [provider]  Zinc 50 MG TABS Take 1 tablet by mouth daily in the afternoon.    [provider]    Family History    Family History  Problem Relation Age of Onset   Other Mother        bronchiectasis   Pneumonia Mother    Heart failure Mother    Alzheimer's disease Father    Stroke Father    Heart failure Father    Pancreatic cancer Brother    Hyperlipidemia Sister    Other Sister        bronchiectasis   She indicated that her mother is deceased. She indicated that her father is deceased. She indicated that the status of her brother is unknown. She indicated that her maternal grandmother is deceased. She indicated that her maternal grandfather is deceased. She indicated that her paternal grandmother is deceased. She indicated that her paternal grandfather is deceased.  Social History    Social History   Socioeconomic History   Marital status: Divorced    Spouse name: Not on file   Number of children: 3   Years of education: Not on file   Highest education level: Bachelor's degree (e.g., BA, AB, BS)  Occupational History   Occupation:  Engineer, site - substitute teaching now  Tobacco Use   Smoking status: Never   Smokeless tobacco: Never  Vaping Use   Vaping status: Never Used  Substance and Sexual Activity   Alcohol use: Not Currently    Comment: Rare   Drug use: No   Sexual activity: Not Currently    Birth control/protection: Abstinence  Other Topics Concern   Not on file  Social History Narrative   Plays piano, 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   Declines flu and pneumonia shot 04-30-10   Social Drivers of Health   Financial Resource Strain: Low Risk  (07/20/2023)   Overall Financial Resource Strain (CARDIA)    Difficulty of Paying Living Expenses: Not hard at all  Food Insecurity: No Food Insecurity (07/20/2023)   Hunger Vital Sign    Worried About Running Out of Food in the Last Year: Never true    Ran Out of Food in the Last Year: Never true  Transportation Needs: No Transportation Needs (07/20/2023)   PRAPARE - Administrator, Civil Service (Medical): No    Lack of Transportation (Non-Medical): No  Physical Activity: Insufficiently Active (07/20/2023)   Exercise Vital Sign    Days of Exercise per Week: 3 days    Minutes of Exercise per Session: 30 min  Stress: No Stress Concern Present (07/20/2023)   [employer] of Occupational Health - Occupational Stress Questionnaire    Feeling of Stress : Not at all  Social Connections: Moderately Integrated (07/20/2023)   Social Connection and Isolation Panel [NHANES]    Frequency of Communication with Friends and Family: More than three times a week    Frequency of Social Gatherings with Friends and Family: More than three times a week    Attends Religious Services: More than 4 times per year    Active Member of [employer] or Organizations: Yes    Attends Engineer, structural: More than 4 times per year    Marital Status: Divorced  Intimate Partner Violence: Not At Risk (05/23/2023)   Humiliation, Afraid, Rape, and Kick questionnaire    Fear of Current or  Ex-Partner: No    Emotionally Abused: No    Physically Abused: No    Sexually Abused: No     Review of Systems    General:  No chills, fever, night sweats or weight changes.  Cardiovascular:  No chest pain, dyspnea on exertion, edema, orthopnea, palpitations, paroxysmal nocturnal dyspnea. Dermatological: No rash, lesions/masses Respiratory: No cough, dyspnea Urologic: No hematuria, dysuria Abdominal:   No nausea, vomiting, diarrhea, bright red blood per rectum, melena, or hematemesis Neurologic:  No visual changes, wkns, changes in mental status. All other systems reviewed and are otherwise negative except as noted above.  Physical Exam    VS:  BP 128/62 (BP Location: Right Arm, Patient Position: Sitting, Cuff Size: Normal)   Pulse 82   Ht 5' 2.5" (1.588 m)   Wt 119 lb (54 kg)   SpO2 98%   BMI 21.42 kg/m  , BMI Body mass index is 21.42 kg/m. GEN: Well nourished, well developed, in no acute distress. HEENT: normal. Neck: Supple, no JVD, carotid bruits, or masses. Cardiac: RRR, no murmurs, rubs, or gallops. No clubbing, cyanosis, edema.  Radials/DP/PT 2+ and equal bilaterally.  Respiratory:  Respirations regular and unlabored, clear to auscultation bilaterally. GI: Soft, nontender, nondistended, BS + x 4. MS: no deformity or atrophy. Skin: warm and dry, no rash. Neuro:  Strength and sensation are intact. Psych: Normal affect.  Accessory Clinical Findings    Recent Labs: 04/28/2023: ALT 27; BUN 22; Creatinine, Ser 0.84; Hemoglobin 13.8; Platelets 361.0; Potassium 4.2; Sodium 141; TSH 0.38   Recent Lipid Panel    Component Value Date/Time   CHOL 164 04/28/2023 1448   CHOL 167 02/25/2022 1022   TRIG 254.0 (H) 04/28/2023 1448   HDL 44.50 04/28/2023 1448   HDL 56 02/25/2022 1022   CHOLHDL 4 04/28/2023 1448   VLDL 50.8 (H) 04/28/2023 1448   LDLCALC 69 04/28/2023 1448   LDLCALC 97 02/25/2022 1022   LDLDIRECT 89.0 10/21/2022 1442         ECG personally reviewed by  me today- EKG Interpretation Date/Time:  Tuesday September 06 2023 13:27:23 EDT  Ventricular Rate:  83 PR Interval:  156 QRS Duration:  122 QT Interval:  398 QTC Calculation: 467 R Axis:   -41  Text Interpretation: Normal sinus rhythm Left axis deviation Left ventricular hypertrophy with QRS widening and repolarization abnormality ( Cornell product ) When compared with ECG of 13-Mar-2014 09:17, Borderline criteria for Anteroseptal infarct are no longer Present Confirmed by Edd Fabian [phone]) on 09/06/2023 1:33:10 PM    Echocardiogram 03/05/2022  IMPRESSIONS     1. GLS -15.2. Left ventricular ejection fraction, by estimation, is 55 to  60%. The left ventricle has normal function. The left ventricle has no  regional wall motion abnormalities. Left ventricular diastolic parameters  are consistent with Grade I  diastolic dysfunction (impaired relaxation).   2. Right ventricular systolic function is normal. The right ventricular  size is normal.   3. The mitral valve is normal in structure. No evidence of mitral valve  regurgitation. No evidence of mitral stenosis.   4. The aortic valve is normal in structure. Aortic valve regurgitation is  not visualized. No aortic stenosis is present.   5. The inferior vena cava is normal in size with greater than 50%  respiratory variability, suggesting right atrial pressure of 3 mmHg.   FINDINGS   Left Ventricle: GLS -15.2. Left ventricular ejection fraction, by  estimation, is 55 to 60%. The left ventricle has normal function. The left  ventricle has no regional wall motion abnormalities. The left ventricular  internal cavity size was normal in  size. There is no left ventricular hypertrophy. Left ventricular diastolic  parameters are consistent with Grade I diastolic dysfunction (impaired  relaxation).   Right Ventricle: The right ventricular size is normal. No increase in  right ventricular wall thickness. Right ventricular systolic function  is  normal.   Left Atrium: Left atrial size was normal in size.   Right Atrium: Right atrial size was normal in size.   Pericardium: There is no evidence of pericardial effusion.   Mitral Valve: The mitral valve is normal in structure. No evidence of  mitral valve regurgitation. No evidence of mitral valve stenosis.   Tricuspid Valve: The tricuspid valve is normal in structure. Tricuspid  valve regurgitation is not demonstrated. No evidence of tricuspid  stenosis.   Aortic Valve: The aortic valve is normal in structure. Aortic valve  regurgitation is not visualized. No aortic stenosis is present. Aortic  valve mean gradient measures 3.0 mmHg. Aortic valve peak gradient measures  5.0 mmHg. Aortic valve area, by VTI  measures 1.78 cm.   Pulmonic Valve: The pulmonic valve was normal in structure. Pulmonic valve  regurgitation is not visualized. No evidence of pulmonic stenosis.   Aorta: The aortic root is normal in size and structure.   Venous: The inferior vena cava is normal in size with greater than 50%  respiratory variability, suggesting right atrial pressure of 3 mmHg.   IAS/Shunts: No atrial level shunt detected by color flow Doppler.       Assessment & Plan   1.  Dilated,Nonischemic cardiomyopathy-no increased DOE or activity intolerance.  Remains somewhat physically active.  She enjoys helping with her church.  She has a history of fatigue while taking beta-blocker therapy.  Echocardiogram 03/05/2022 showed LVEF of 55 to 60% and G1 DD. Heart healthy low-sodium diet Maintain physical activity Continue lisinopril  Hyperlipidemia-LDL 69 on 04/28/23. High-fiber diet Continue aspirin, atorvastatin, co-Q10 Maintain physical activity  Dyspnea-stable at this time.  Maintain somewhat active lifestyle.  Previously noted in the  setting of reduced ejection fraction. Maintain physical activity Continue to monitor  Precordial pain-intermittent brief episodes over the last  several months.  Nonexertional. No plans for ischemic evaluation at this time. Patient reassured Continue to monitor  Disposition: Follow-up with Dr. Jens Som or me in 9 months.   Thomasene Ripple. Trace Wirick NP-C     09/06/2023, 1:33 PM [facility] [address] Office [phone] Fax [phone]    I spent 14 minutes examining this patient, reviewing medications, and using patient centered shared decision making involving their cardiac care.   I spent  20 minutes reviewing past medical history,  medications, and prior cardiac tests.

## 2023-09-06 ENCOUNTER — Encounter: Payer: Self-pay | Admitting: General Practice

## 2023-09-06 ENCOUNTER — Ambulatory Visit: Payer: Medicare Other | Attending: General Practice | Admitting: General Practice

## 2023-09-06 VITALS — BP 128/62 | HR 82 | Ht 62.5 in | Wt 119.0 lb

## 2023-09-06 DIAGNOSIS — E7800 Pure hypercholesterolemia, unspecified: Secondary | ICD-10-CM

## 2023-09-06 DIAGNOSIS — R0602 Shortness of breath: Secondary | ICD-10-CM | POA: Diagnosis not present

## 2023-09-06 DIAGNOSIS — I420 Dilated cardiomyopathy: Secondary | ICD-10-CM

## 2023-09-06 MED ORDER — LISINOPRIL 2.5 MG PO TABS: ORAL_TABLET | ORAL | 2 refills | Status: DC

## 2023-09-06 NOTE — Patient Instructions (Signed)
 Medication Instructions:  The current medical regimen is effective;  continue present plan and medications as directed. Please refer to the Current Medication list given to you today.  *If you need a refill on your cardiac medications before your next appointment, please call your pharmacy*  Lab Work: NONE  Follow-Up: At [facility], you and your health needs are our priority.  As part of our continuing mission to provide you with exceptional heart care, we have created designated Provider Care Teams.  These Care Teams include your primary Cardiologist (physician) and Advanced Practice Providers (APPs -  Physician Assistants and Nurse Practitioners) who all work together to provide you with the care you need, when you need it.  Your next appointment:   9 month(s)  Provider:   Olga Millers, MD  or Edd Fabian, FNP        s

## 2023-09-20 ENCOUNTER — Encounter: Payer: Self-pay | Admitting: Family Medicine

## 2023-09-20 ENCOUNTER — Ambulatory Visit (INDEPENDENT_AMBULATORY_CARE_PROVIDER_SITE_OTHER): Admitting: Family Medicine

## 2023-09-20 VITALS — BP 124/66 | HR 96 | Temp 97.7°F | Ht 62.5 in | Wt 119.0 lb

## 2023-09-20 DIAGNOSIS — J069 Acute upper respiratory infection, unspecified: Secondary | ICD-10-CM

## 2023-09-20 MED ORDER — FLUTICASONE PROPIONATE 50 MCG/ACT NA SUSP: 2.0000 | Freq: Every day | NASAL | 2 refills | Status: DC

## 2023-09-20 MED ORDER — RYALTRIS 665-25 MCG/ACT NA SUSP
2.0000 | Freq: Two times a day (BID) | NASAL | 3 refills | Status: DC
Start: 2023-09-20 — End: 2023-09-20

## 2023-09-20 MED ORDER — GUAIFENESIN ER 600 MG PO TB12: 600.0000 mg | ORAL_TABLET | Freq: Two times a day (BID) | ORAL | 0 refills | Status: DC

## 2023-09-20 MED ORDER — AZELASTINE HCL 0.1 % NA SOLN
1.0000 | Freq: Two times a day (BID) | NASAL | 12 refills | Status: DC
Start: 2023-09-20 — End: 2023-12-06

## 2023-09-20 MED ORDER — BENZONATATE 100 MG PO CAPS: 100.0000 mg | ORAL_CAPSULE | Freq: Two times a day (BID) | ORAL | 0 refills | Status: DC | PRN

## 2023-09-20 NOTE — Progress Notes (Signed)
 Acute Office Visit  Subjective:     Patient ID: Jaclyn Bennett, female    DOB: December 02, 1950, 73 y.o.   MRN: [id number]  Chief Complaint  Patient presents with   Sore Throat     Patient is in today for sore throat, rhinitis.    Discussed the use of AI scribe software for clinical note transcription with the patient, who gave verbal consent to proceed.  History of Present Illness The patient presents with a sore throat and nasal drainage.  She has been feeling unwell for two days, initially experiencing a sore throat which has since improved. She describes a pattern where a sore throat typically precedes symptoms moving to her chest, often requiring antibiotics for resolution. Currently, she reports nasal drainage, describing it as white and glue-like with some green at the end. No fever, nausea, vomiting, diarrhea, wheezing, coughing up blood, or mucus. Her ears feel 'a little stopped up' but there is no pain or pressure in her sinuses.  She has a history of using cotton in her ears during winter to prevent bronchitis, which she feels has been effective. She took an allergy pill yesterday and today, which she believes helped reduce the drainage but not the chest symptoms. She also experienced difficulty sleeping on Saturday night due to high temperatures in her bedroom, which she feels may have affected her immune system.  She notes that exposure to wind, particularly after attending church and lunch with a friend, may have contributed to her symptoms. She has been around sick individuals at church, which she believes may have contributed to her current condition. No sneezing, itchy or watery eyes, and she does not currently use nasal sprays, although she has in the past.        ROS All review of systems negative except what is listed in the HPI      Objective:    BP 124/66   Pulse 96   Temp 97.7 F (36.5 C) (Oral)   Ht 5' 2.5" (1.588 m)   Wt 119 lb (54 kg)   SpO2 97%    BMI 21.42 kg/m    Physical Exam Vitals reviewed.  Constitutional:      Appearance: She is well-developed.  HENT:     Head: Normocephalic and atraumatic.     Right Ear: Tympanic membrane normal.     Left Ear: Tympanic membrane normal.     Nose: Congestion present.     Mouth/Throat:     Mouth: Mucous membranes are moist.     Pharynx: Oropharynx is clear.  Eyes:     Conjunctiva/sclera: Conjunctivae normal.  Cardiovascular:     Rate and Rhythm: Normal rate and regular rhythm.  Pulmonary:     Effort: Pulmonary effort is normal.     Breath sounds: Normal breath sounds. No wheezing, rhonchi or rales.  Musculoskeletal:     Cervical back: Normal range of motion.  Lymphadenopathy:     Cervical: No cervical adenopathy.  Skin:    General: Skin is warm and dry.  Neurological:     Mental Status: She is alert and oriented to person, place, and time.  Psychiatric:        Mood and Affect: Mood normal.        Behavior: Behavior normal.     No results found for any visits on 09/20/23.      Assessment & Plan:   Problem List Items Addressed This Visit   None Visit Diagnoses  Viral URI    -  Primary   Relevant Medications   guaiFENesin (MUCINEX) 600 MG 12 hr tablet   benzonatate (TESSALON) 100 MG capsule   azelastine (ASTELIN) 0.1 % nasal spray   fluticasone (FLONASE) 50 MCG/ACT nasal spray       Assessment & Plan Upper Respiratory Tract Infection Symptoms suggest a viral upper respiratory tract infection vs allergies. No current need for antibiotics. Focus on symptom management and preventing progression. - Prescribed nasal sprays - Prescribed Mucinex and Tessalon - Advised increased fluid intake. - Encouraged adequate rest. - Instructed to report if no improvement by Friday via MyChart or call. - Discussed potential antibiotics if no improvement by Friday.         Meds ordered this encounter  Medications   guaiFENesin (MUCINEX) 600 MG 12 hr tablet     Sig: Take 1 tablet (600 mg total) by mouth 2 (two) times daily.    Dispense:  30 tablet    Refill:  0    Supervising Provider:   Danise Edge A [[id number]]   benzonatate (TESSALON) 100 MG capsule    Sig: Take 1 capsule (100 mg total) by mouth 2 (two) times daily as needed for cough.    Dispense:  20 capsule    Refill:  0    Supervising Provider:   Danise Edge A [[id number]]   DISCONTD: Olopatadine-Mometasone (RYALTRIS) [id number] MCG/ACT SUSP    Sig: Place 2 sprays into the nose 2 (two) times daily.    Dispense:  29 g    Refill:  3    Supervising Provider:   Danise Edge A [[id number]]   azelastine (ASTELIN) 0.1 % nasal spray    Sig: Place 1 spray into both nostrils 2 (two) times daily. Use in each nostril as directed    Dispense:  30 mL    Refill:  12    Supervising Provider:   Danise Edge A [[id number]]   fluticasone (FLONASE) 50 MCG/ACT nasal spray    Sig: Place 2 sprays into both nostrils daily.    Dispense:  16 g    Refill:  2    Supervising Provider:   Danise Edge A [[id number]]    Return if symptoms worsen or fail to improve.  Clayborne Dana, NP

## 2023-09-23 ENCOUNTER — Telehealth: Payer: Self-pay | Admitting: Neurology

## 2023-09-23 DIAGNOSIS — J069 Acute upper respiratory infection, unspecified: Secondary | ICD-10-CM

## 2023-09-23 MED ORDER — AMOXICILLIN-POT CLAVULANATE 875-125 MG PO TABS
1.0000 | ORAL_TABLET | Freq: Two times a day (BID) | ORAL | 0 refills | Status: AC
Start: 2023-09-23 — End: 2023-09-30

## 2023-09-23 NOTE — Telephone Encounter (Signed)
 Sending in Augmentin - take with food and water. Continue supportive measures. Follow-up if not improving.

## 2023-09-23 NOTE — Telephone Encounter (Signed)
 LVM letting patient know RX sent to pharmacy and recommendations from [facility].

## 2023-09-23 NOTE — Telephone Encounter (Signed)
 Please advise.   Copied from CRM [id number]. Topic: Clinical - Medical Advice >> Sep 23, 2023  8:25 AM [name] wrote: Reason for CRM: Patient was seen on 09/20/23 with Jaclyn Bennett, per patient she was told to call the office if symptoms were not improving and an antibiotic would be sent. Patient feels like her symptoms are not improving and would like for the antibiotic to be sent to her pharmacy.

## 2023-10-27 ENCOUNTER — Other Ambulatory Visit: Payer: Self-pay | Admitting: Family Medicine

## 2023-10-27 DIAGNOSIS — M5412 Radiculopathy, cervical region: Secondary | ICD-10-CM

## 2023-11-01 DIAGNOSIS — H2513 Age-related nuclear cataract, bilateral: Secondary | ICD-10-CM | POA: Diagnosis not present

## 2023-12-06 ENCOUNTER — Ambulatory Visit (INDEPENDENT_AMBULATORY_CARE_PROVIDER_SITE_OTHER): Admitting: Family Medicine

## 2023-12-06 ENCOUNTER — Encounter: Payer: Self-pay | Admitting: Family Medicine

## 2023-12-06 VITALS — BP 110/70 | HR 79 | Temp 97.8°F | Resp 16 | Ht 62.5 in | Wt 117.6 lb

## 2023-12-06 DIAGNOSIS — I420 Dilated cardiomyopathy: Secondary | ICD-10-CM

## 2023-12-06 DIAGNOSIS — E2839 Other primary ovarian failure: Secondary | ICD-10-CM

## 2023-12-06 DIAGNOSIS — E785 Hyperlipidemia, unspecified: Secondary | ICD-10-CM | POA: Diagnosis not present

## 2023-12-06 DIAGNOSIS — E039 Hypothyroidism, unspecified: Secondary | ICD-10-CM

## 2023-12-06 DIAGNOSIS — B009 Herpesviral infection, unspecified: Secondary | ICD-10-CM | POA: Diagnosis not present

## 2023-12-06 MED ORDER — ESTRADIOL 0.5 MG PO TABS
0.2500 mg | ORAL_TABLET | ORAL | 5 refills | Status: AC
Start: 2023-12-06 — End: ?

## 2023-12-06 MED ORDER — FAMCICLOVIR 250 MG PO TABS: 125.0000 mg | ORAL_TABLET | Freq: Every day | ORAL | 3 refills | Status: AC

## 2023-12-06 NOTE — Progress Notes (Signed)
 75  Established Patient Office Visit  Subjective   Patient ID: Jaclyn Bennett, female    DOB: 05-Jan-1951  Age: 73 y.o. MRN: [id number]  Chief Complaint  Patient presents with   Hypertension   Hyperlipidemia   Hypothyroidism   Follow-up    HPI Discussed the use of AI scribe software for clinical note transcription with the patient, who gave verbal consent to proceed.  History of Present Illness Jaclyn Bennett is a 73 year old female who presents for a follow-up visit.  She experiences intermittent chest pain, which typically resolves on its own, but a recent episode lasted longer than usual. An EKG performed in March showed changes suggestive of a past myocardial infarction. She carries a copy of this EKG for emergency situations.   Her medication regimen includes Famvir , Estradiol, thyroid  medication, Ativan, and Flexeril . She takes Estradiol 0.5 mg every other night for osteoporosis prevention, given her family history of the condition affecting her mother and grandmother. She has requested a refill for Estradiol.  She maintains regular health appointments, having seen her cardiologist in March, her eye doctor in May, and her dentist in April. She feels tired and experiences pain but has no recent exposure to illnesses, noting her efforts to avoid germs. She has a history of pneumonia.   Patient Active Problem List   Diagnosis Date Noted   Left lateral epicondylitis 07/21/2023   Urinary tract infection with hematuria 06/03/2022   Radiculitis of left cervical region 01/07/2021   Fatigue 08/03/2018   Hyperlipidemia LDL goal <100 10/31/2017   Pharyngitis 10/31/2017   Herpes simplex type 2 infection 03/30/2016   Congestive dilated cardiomyopathy (HCC) 10/22/2015   Nonspecific abnormal results of cardiovascular function study 02/26/2014   Screening for ischemic heart disease 12/23/2013   ALLERGIC RHINITIS 07/07/2009   LUMP OR MASS IN BREAST 04/05/2008    Hypothyroidism 08/25/2007   HYPERCHOLESTEROLEMIA 08/25/2007   ANXIETY 08/01/2007   INTERSTITIAL CYSTITIS 08/01/2007   Fibromyalgia 08/01/2007   Past Medical History:  Diagnosis Date   Acute asthmatic bronchitis    Allergy    Anxiety    Cataract    Fibromyalgia    Herpes simplex type 2 infection 03/30/2016   Hypercholesterolemia    Hypothyroidism    Interstitial cystitis    Past Surgical History:  Procedure Laterality Date   LEFT HEART CATHETERIZATION WITH CORONARY ANGIOGRAM Bilateral 03/13/2014   Procedure: LEFT HEART CATHETERIZATION WITH CORONARY ANGIOGRAM;  Surgeon: Ozell JONETTA Fell, MD;  Location: [facility] CATH LAB;  Service: Cardiovascular;  Laterality: Bilateral;   TOTAL ABDOMINAL HYSTERECTOMY  1997   Social History   Tobacco Use   Smoking status: Never   Smokeless tobacco: Never  Vaping Use   Vaping status: Never Used  Substance Use Topics   Alcohol use: Not Currently    Comment: Rare   Drug use: No   Social History   Socioeconomic History   Marital status: Divorced    Spouse name: Not on file   Number of children: 3   Years of education: Not on file   Highest education level: Bachelor's degree (e.g., BA, AB, BS)  Occupational History   Occupation: Engineer, site - substitute teaching now  Tobacco Use   Smoking status: Never   Smokeless tobacco: Never  Vaping Use   Vaping status: Never Used  Substance and Sexual Activity   Alcohol use: Not Currently    Comment: Rare   Drug use: No   Sexual activity: Not Currently  Birth control/protection: Abstinence  Other Topics Concern   Not on file  Social History Narrative   Plays piano, 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   Declines flu and pneumonia shot 04-30-10   Social Drivers of Health   Financial Resource Strain: Low Risk  (12/01/2023)   Overall Financial Resource Strain (CARDIA)    Difficulty of Paying Living Expenses: Not hard at all  Food Insecurity: No Food Insecurity (12/01/2023)   Hunger Vital Sign    Worried  About Running Out of Food in the Last Year: Never true    Ran Out of Food in the Last Year: Never true  Transportation Needs: No Transportation Needs (12/01/2023)   PRAPARE - Administrator, Civil Service (Medical): No    Lack of Transportation (Non-Medical): No  Physical Activity: Insufficiently Active (12/01/2023)   Exercise Vital Sign    Days of Exercise per Week: 3 days    Minutes of Exercise per Session: 30 min  Stress: No Stress Concern Present (12/01/2023)   [employer] of Occupational Health - Occupational Stress Questionnaire    Feeling of Stress: Not at all  Social Connections: Moderately Integrated (12/01/2023)   Social Connection and Isolation Panel    Frequency of Communication with Friends and Family: More than three times a week    Frequency of Social Gatherings with Friends and Family: More than three times a week    Attends Religious Services: More than 4 times per year    Active Member of [employer] or Organizations: Yes    Attends Engineer, structural: More than 4 times per year    Marital Status: Divorced  Intimate Partner Violence: Not At Risk (05/23/2023)   Humiliation, Afraid, Rape, and Kick questionnaire    Fear of Current or Ex-Partner: No    Emotionally Abused: No    Physically Abused: No    Sexually Abused: No   Family Status  Relation Name Status   Mother  Deceased at age 35   Father Carlin Elgin [place], Sr. Deceased at age 45   Brother  (Not Specified)   Sister Transport planner (Not Specified)   Sister  (Not Specified)   MGM  Deceased   MGF  Deceased   PGM  Deceased   PGF  Deceased  No partnership data on file   Family History  Problem Relation Age of Onset   Other Mother        bronchiectasis   Pneumonia Mother    Heart failure Mother    Alzheimer's disease Father    Stroke Father    Heart failure Father    Pancreatic cancer Brother    Hyperlipidemia Sister    Other Sister        bronchiectasis   Allergies  Allergen  Reactions   Iodine Anaphylaxis   Valtrex  [Valacyclovir  Hcl] Other (See Comments)    Severe nausea    Anesthetics, Amide Nausea And Vomiting   Benadryl  Allergy [Diphenhydramine  Hcl (Sleep)]     Makes patient hyper   Diphenhydramine     Fenofibrate      Elevated liver func Decc kidney function   Sulfonamide Derivatives     REACTION: throat swelling and itching      ROS    Objective:     BP 110/70 (BP Location: Right Arm, Patient Position: Sitting, Cuff Size: Normal)   Pulse 79   Temp 97.8 F (36.6 C) (Oral)   Resp 16   Ht 5' 2.5 (1.588 m)   Wt 117 lb 9.6  oz (53.3 kg)   SpO2 100%   BMI 21.17 kg/m  BP Readings from Last 3 Encounters:  12/06/23 110/70  09/20/23 124/66  09/06/23 128/62   Wt Readings from Last 3 Encounters:  12/06/23 117 lb 9.6 oz (53.3 kg)  09/20/23 119 lb (54 kg)  09/06/23 119 lb (54 kg)   SpO2 Readings from Last 3 Encounters:  12/06/23 100%  09/20/23 97%  09/06/23 98%      Physical Exam   No results found for any visits on 12/06/23.  Last CBC Lab Results  Component Value Date   WBC 5.8 04/28/2023   HGB 13.8 04/28/2023   HCT 41.5 04/28/2023   MCV 94.5 04/28/2023   MCH 31.1 12/15/2022   RDW 13.0 04/28/2023   PLT 361.0 04/28/2023   Last metabolic panel Lab Results  Component Value Date   GLUCOSE 113 (H) 04/28/2023   NA 141 04/28/2023   K 4.2 04/28/2023   CL 101 04/28/2023   CO2 32 04/28/2023   BUN 22 04/28/2023   CREATININE 0.84 04/28/2023   GFR 69.25 04/28/2023   CALCIUM  9.7 04/28/2023   PROT 6.5 04/28/2023   ALBUMIN 4.2 04/28/2023   LABGLOB 2.2 02/25/2022   AGRATIO 2.1 02/25/2022   BILITOT 0.3 04/28/2023   ALKPHOS 114 04/28/2023   AST 24 04/28/2023   ALT 27 04/28/2023   Last lipids Lab Results  Component Value Date   CHOL 164 04/28/2023   HDL 44.50 04/28/2023   LDLCALC 69 04/28/2023   LDLDIRECT 89.0 10/21/2022   TRIG 254.0 (H) 04/28/2023   CHOLHDL 4 04/28/2023   Last hemoglobin A1c Lab Results  Component  Value Date   HGBA1C 5.8 08/12/2016   Last thyroid  functions Lab Results  Component Value Date   TSH 0.38 04/28/2023   Last vitamin D  Lab Results  Component Value Date   VD25OH 44.94 12/24/2014   Last vitamin B12 and Folate Lab Results  Component Value Date   VITAMINB12 > 1500 pg/mL (H) 08/18/2007   FOLATE > 20.0 ng/mL 08/18/2007      The 10-year ASCVD risk score (Arnett DK, et al., 2019) is: 12.7%    Assessment & Plan:   Problem List Items Addressed This Visit       Unprioritized   Herpes simplex type 2 infection - Primary   Relevant Medications   famciclovir  (FAMVIR ) 250 MG tablet   Congestive dilated cardiomyopathy (HCC)   Relevant Orders   CBC with Differential/Platelet   Hypothyroidism   Check labs       Relevant Orders   CBC with Differential/Platelet   TSH   Hyperlipidemia LDL goal <100   Encourage heart healthy diet such as MIND or DASH diet, increase exercise, avoid trans fats, simple carbohydrates and processed foods, consider a krill or fish or flaxseed oil cap daily.        Other Visit Diagnoses       Hyperlipidemia, unspecified hyperlipidemia type       Relevant Orders   CBC with Differential/Platelet   Comprehensive metabolic panel with GFR   Lipid panel     Estrogen deficiency       Relevant Medications   estradiol (ESTRACE) 0.5 MG tablet     Assessment and Plan Assessment & Plan Chest pain   She experiences intermittent chest pain and recently had a longer episode. She will contact her cardiologist if it persists.  Anterior septal myocardial infarction   Her EKG shows a widened septum due to anterior septal myocardial infarction,  which can lead to misinterpretation and unnecessary hospitalization. She should carry a copy of her EKG to prevent this.  Knee pain secondary to ACL tear   She reports knee pain from an ACL tear after a fall and is undergoing physical therapy. Continue physical therapy.  Osteoporosis prevention   She  takes Estradiol 0.5 mg every other night to prevent osteoporosis due to family history and has successfully avoided it. Refill Estradiol prescription.  General Health Maintenance   She maintains regular health check-ups, visiting the eye doctor in May and the dentist in April. Perform blood work.    No follow-ups on file.    Capria Cartaya R Lowne Chase, DO

## 2023-12-06 NOTE — Assessment & Plan Note (Signed)
 Encourage heart healthy diet such as MIND or DASH diet, increase exercise, avoid trans fats, simple carbohydrates and processed foods, consider a krill or fish or flaxseed oil cap daily.

## 2023-12-06 NOTE — Assessment & Plan Note (Signed)
 Check labs

## 2023-12-07 LAB — LIPID PANEL
Cholesterol: 172 mg/dL (ref 0–200)
HDL: 44.8 mg/dL (ref >39.00)
LDL Cholesterol: 78 mg/dL (ref 0–99)
NonHDL: 126.72
Total CHOL/HDL Ratio: 4
Triglycerides: 245 mg/dL — ABNORMAL HIGH (ref 0.0–149.0)
VLDL: 49 mg/dL — ABNORMAL HIGH (ref 0.0–40.0)

## 2023-12-07 LAB — COMPREHENSIVE METABOLIC PANEL WITH GFR
ALT: 26 U/L (ref 0–35)
AST: 25 U/L (ref 0–37)
Albumin: 4.4 g/dL (ref 3.5–5.2)
Alkaline Phosphatase: 104 U/L (ref 39–117)
BUN: 20 mg/dL (ref 6–23)
CO2: 31 meq/L (ref 19–32)
Calcium: 9.7 mg/dL (ref 8.4–10.5)
Chloride: 101 meq/L (ref 96–112)
Creatinine, Ser: 0.82 mg/dL (ref 0.40–1.20)
GFR: 70.98 mL/min (ref >60.00)
Glucose, Bld: 104 mg/dL — ABNORMAL HIGH (ref 70–99)
Potassium: 3.9 meq/L (ref 3.5–5.1)
Sodium: 140 meq/L (ref 135–145)
Total Bilirubin: 0.3 mg/dL (ref 0.2–1.2)
Total Protein: 6.8 g/dL (ref 6.0–8.3)

## 2023-12-07 LAB — CBC WITH DIFFERENTIAL/PLATELET
Basophils Absolute: 0 10*3/uL (ref 0.0–0.1)
Basophils Relative: 0.7 % (ref 0.0–3.0)
Eosinophils Absolute: 0.1 10*3/uL (ref 0.0–0.7)
Eosinophils Relative: 2.1 % (ref 0.0–5.0)
HCT: 40.4 % (ref 36.0–46.0)
Hemoglobin: 13.6 g/dL (ref 12.0–15.0)
Lymphocytes Relative: 23.7 % (ref 12.0–46.0)
Lymphs Abs: 1.4 10*3/uL (ref 0.7–4.0)
MCHC: 33.6 g/dL (ref 30.0–36.0)
MCV: 91.6 fl (ref 78.0–100.0)
Monocytes Absolute: 0.6 10*3/uL (ref 0.1–1.0)
Monocytes Relative: 9.5 % (ref 3.0–12.0)
Neutro Abs: 3.9 10*3/uL (ref 1.4–7.7)
Neutrophils Relative %: 64 % (ref 43.0–77.0)
Platelets: 344 10*3/uL (ref 150.0–400.0)
RBC: 4.41 Mil/uL (ref 3.87–5.11)
RDW: 12.5 % (ref 11.5–15.5)
WBC: 6.1 10*3/uL (ref 4.0–10.5)

## 2023-12-07 LAB — TSH: TSH: 0.34 u[IU]/mL — ABNORMAL LOW (ref 0.35–5.50)

## 2023-12-11 ENCOUNTER — Other Ambulatory Visit: Payer: Self-pay | Admitting: Family Medicine

## 2023-12-11 ENCOUNTER — Ambulatory Visit: Payer: Self-pay | Admitting: Family Medicine

## 2023-12-11 DIAGNOSIS — E039 Hypothyroidism, unspecified: Secondary | ICD-10-CM

## 2023-12-15 ENCOUNTER — Other Ambulatory Visit: Payer: Self-pay

## 2023-12-15 MED ORDER — LEVOTHYROXINE SODIUM 50 MCG PO TABS: 50.0000 ug | ORAL_TABLET | Freq: Every day | ORAL | 0 refills | Status: DC

## 2024-02-14 ENCOUNTER — Encounter: Payer: Self-pay | Admitting: Sports Medicine

## 2024-02-21 ENCOUNTER — Other Ambulatory Visit (INDEPENDENT_AMBULATORY_CARE_PROVIDER_SITE_OTHER)

## 2024-02-21 DIAGNOSIS — E039 Hypothyroidism, unspecified: Secondary | ICD-10-CM | POA: Diagnosis not present

## 2024-02-22 LAB — TSH: TSH: 4.05 u[IU]/mL (ref 0.35–5.50)

## 2024-02-29 ENCOUNTER — Ambulatory Visit: Payer: Self-pay | Admitting: Family Medicine

## 2024-03-12 ENCOUNTER — Other Ambulatory Visit: Payer: Self-pay | Admitting: Family Medicine

## 2024-03-14 ENCOUNTER — Other Ambulatory Visit: Payer: Self-pay | Admitting: Obstetrics and Gynecology

## 2024-03-14 DIAGNOSIS — Z1231 Encounter for screening mammogram for malignant neoplasm of breast: Secondary | ICD-10-CM

## 2024-03-19 ENCOUNTER — Other Ambulatory Visit: Payer: Self-pay | Admitting: Family Medicine

## 2024-03-19 DIAGNOSIS — E785 Hyperlipidemia, unspecified: Secondary | ICD-10-CM

## 2024-04-04 ENCOUNTER — Ambulatory Visit: Payer: Self-pay | Admitting: Family Medicine

## 2024-04-04 NOTE — Telephone Encounter (Signed)
 Appt scheduled

## 2024-04-04 NOTE — Telephone Encounter (Signed)
 FYI Only or Action Required?: FYI only for provider.  Patient was last seen in primary care on 12/06/2023 by Antonio Meth, Jamee SAUNDERS, DO.  Called Nurse Triage reporting Finger Injury.  Symptoms began several days ago.  Triage Disposition: See Physician Within 24 Hours  Patient/caregiver understands and will follow disposition?: Yes            Copied from CRM #[id number]. Topic: Clinical - Red Word Triage >> Apr 04, 2024  1:36 PM Jaclyn Bennett wrote: Red Word that prompted transfer to Nurse Triage: Patient closed her car door on her thumb on Thursday. Thumb is still swollen has not gone down and experiencing pain. Reason for Disposition  Finger joint can't be opened (straightened) or closed (bent) completely  (Note: Injured person should be able to do this without assistance.)  Answer Assessment - Initial Assessment Questions This RN scheduled pt for an appointment tomorrow in office.    Left thumb pain Right now not that bad not sure if pt can even say 1/10; pain when doing something Swelling has gone down Last Thurs pt closed the car door on her finger One side bled and the other side had a little cut; pt used ice packs; pt felt nauseous through Mon  Protocols used: Finger Injury-A-AH

## 2024-04-05 ENCOUNTER — Ambulatory Visit (INDEPENDENT_AMBULATORY_CARE_PROVIDER_SITE_OTHER): Admitting: Family Medicine

## 2024-04-05 ENCOUNTER — Encounter: Payer: Self-pay | Admitting: Family Medicine

## 2024-04-05 ENCOUNTER — Ambulatory Visit

## 2024-04-05 VITALS — BP 110/80 | HR 80 | Temp 97.8°F | Resp 16 | Ht 62.5 in | Wt 119.2 lb

## 2024-04-05 DIAGNOSIS — M79645 Pain in left finger(s): Secondary | ICD-10-CM | POA: Diagnosis not present

## 2024-04-05 DIAGNOSIS — S6992XA Unspecified injury of left wrist, hand and finger(s), initial encounter: Secondary | ICD-10-CM | POA: Diagnosis not present

## 2024-04-05 DIAGNOSIS — M85842 Other specified disorders of bone density and structure, left hand: Secondary | ICD-10-CM | POA: Diagnosis not present

## 2024-04-05 NOTE — Patient Instructions (Addendum)
 Ice/cold pack over area for 10-15 min twice daily.  OK to take Tylenol  1000 mg (2 extra strength tabs) or 975 mg (3 regular strength tabs) every 6 hours as needed.  We will be in touch with the X-ray results.   Please get your X-ray done at the [facility] [place]: [address], [place], [place] [address] [phone]  You do not need an appointment for this location.   Let us  know if you need anything.

## 2024-04-05 NOTE — Progress Notes (Signed)
 Musculoskeletal Exam  Patient: Jaclyn Bennett DOB: [birth year]  DOS: 04/05/2024  SUBJECTIVE:  Chief Complaint:   Chief Complaint  Patient presents with   Hand Pain    Left thumb, shut car door on thumb, occurred last Thursday.     Jaclyn Bennett is a 73 y.o.  female for evaluation and treatment of L thumb pain.   Onset:  1 week ago. No inj or change in activity.  Location: base of L thumb Character:  aching  Progression of issue:  has improved Associated symptoms: swelling, bruising No redness Treatment: to date has been ice and acetaminophen .   Neurovascular symptoms: no  Past Medical History:  Diagnosis Date   Acute asthmatic bronchitis    Allergy    Anxiety    Cataract    Fibromyalgia    Herpes simplex type 2 infection 03/30/2016   Hypercholesterolemia    Hypothyroidism    Interstitial cystitis     Objective: VITAL SIGNS: BP 110/80 (BP Location: Left Arm, Patient Position: Sitting, Cuff Size: Normal)   Pulse 80   Temp 97.8 F (36.6 C) (Oral)   Resp 16   Ht 5' 2.5 (1.588 m)   Wt 119 lb 3.2 oz (54.1 kg)   SpO2 99%   BMI 21.45 kg/m  Constitutional: Well formed, well developed. No acute distress. Thorax & Lungs: No accessory muscle use Musculoskeletal: Thumb.   Normal active range of motion: Yes  Normal passive range of motion: yes Tenderness to palpation: yes over prox phalanx Deformity: no Ecchymosis: yes along w soft tissue swelling over MT region Tests positive: none Tests negative: Finkelstein's Neurologic: Normal sensory function.  Psychiatric: Normal mood. Age appropriate judgment and insight. Alert & oriented x 3.    Assessment:  Thumb pain, left - Plan: DG Finger Thumb Left  Plan: Ck XR, heat, ice, Tylenol . She will go to [place].  F/u prn. The patient voiced understanding and agreement to the plan.   Mabel Mt Kermit, DO 04/05/24  2:07 PM

## 2024-04-06 ENCOUNTER — Ambulatory Visit: Payer: Self-pay | Admitting: Family Medicine

## 2024-04-11 NOTE — Progress Notes (Signed)
 Jaclyn Bennett                                          MRN: [id number]   04/11/2024   The [facility] Quality Team Specialist reviewed this patient medical record for the purposes of chart review for care gap closure. The following were reviewed: chart review for care gap closure-colorectal cancer screening.    [facility] Quality Team

## 2024-04-26 ENCOUNTER — Other Ambulatory Visit: Payer: Self-pay | Admitting: Family Medicine

## 2024-04-26 DIAGNOSIS — M5412 Radiculopathy, cervical region: Secondary | ICD-10-CM

## 2024-04-27 ENCOUNTER — Ambulatory Visit
Admission: RE | Admit: 2024-04-27 | Discharge: 2024-04-27 | Disposition: A | Discharge status: Home / Self Care | Source: Ambulatory Visit | Attending: Obstetrics and Gynecology | Admitting: Obstetrics and Gynecology

## 2024-04-27 ENCOUNTER — Telehealth: Payer: Self-pay

## 2024-04-27 DIAGNOSIS — Z1231 Encounter for screening mammogram for malignant neoplasm of breast: Secondary | ICD-10-CM

## 2024-04-27 NOTE — Telephone Encounter (Signed)
 Pt called. LVM advising to return call. Not sure what patient was needing by rx as already been refilled

## 2024-04-27 NOTE — Telephone Encounter (Signed)
 Copied from CRM #[id number]. Topic: Clinical - Prescription Issue >> Apr 27, 2024 10:36 AM Nessti S wrote: Reason for CRM: pt calling about med cyclobenzaprine  (FLEXERIL ) 10 MG tablet. And would like a call back [phone]

## 2024-04-30 ENCOUNTER — Telehealth: Payer: Self-pay | Admitting: Family Medicine

## 2024-04-30 NOTE — Telephone Encounter (Signed)
 Copied from CRM #[id number]. Topic: Medicare AWV >> Apr 30, 2024  2:22 PM Nathanel DEL wrote: Called LVM 04/30/2024 to sched AWV. Please schedule in office or virtual visit.   Nathanel Paschal; Care Guide Ambulatory Clinical Support [facility] Direct Dial: [phone]

## 2024-05-24 ENCOUNTER — Ambulatory Visit

## 2024-05-24 ENCOUNTER — Telehealth: Payer: Self-pay | Admitting: Family Medicine

## 2024-05-24 ENCOUNTER — Ambulatory Visit (INDEPENDENT_AMBULATORY_CARE_PROVIDER_SITE_OTHER)

## 2024-05-24 VITALS — Ht 62.5 in | Wt 119.0 lb

## 2024-05-24 DIAGNOSIS — Z0000 Encounter for general adult medical examination without abnormal findings: Secondary | ICD-10-CM | POA: Diagnosis not present

## 2024-05-24 NOTE — Progress Notes (Signed)
 Chief Complaint  Patient presents with   Medicare Wellness     Subjective:   Jaclyn Bennett is a 73 y.o. female who presents for a Medicare Annual Wellness Visit.  Visit info / Clinical Intake: Medicare Wellness Visit Type:: Subsequent Annual Wellness Visit Persons participating in visit and providing information:: patient Medicare Wellness Visit Mode:: Telephone If telephone:: video declined Since this visit was completed virtually, some vitals may be partially provided or unavailable. Missing vitals are due to the limitations of the virtual format.: Documented vitals are patient reported If Telephone or Video please confirm:: I connected with patient using audio/video enable telemedicine. I verified patient identity with two identifiers, discussed telehealth limitations, and patient agreed to proceed. Patient Location:: Home Provider Location:: Office Interpreter Needed?: No Pre-visit prep was completed: yes AWV questionnaire completed by patient prior to visit?: yes Date:: 05/17/24 Living arrangements:: (!) lives alone Patient's Overall Health Status Rating: very good Typical amount of pain: none Does pain affect daily life?: no Are you currently prescribed opioids?: no  Dietary Habits and Nutritional Risks How many meals a day?: 3 Eats fruit and vegetables daily?: yes Most meals are obtained by: preparing own meals; eating out In the last 2 weeks, have you had any of the following?: none Diabetic:: no  Functional Status Activities of Daily Living (to include ambulation/medication): Independent Ambulation: Independent with device- listed below Home Assistive Devices/Equipment: Eyeglasses Medication Administration: Independent Home Management (perform basic housework or laundry): Independent Manage your own finances?: yes Primary transportation is: driving Concerns about vision?: no *vision screening is required for WTM* Concerns about hearing?: no  Fall  Screening Falls in the past year?: 0 Number of falls in past year: 0 Was there an injury with Fall?: 0 Fall Risk Category Calculator: 0 Patient Fall Risk Level: Low Fall Risk  Fall Risk Patient at Risk for Falls Due to: No Fall Risks Fall risk Follow up: Falls evaluation completed  Home and Transportation Safety: All rugs have non-skid backing?: yes All stairs or steps have railings?: yes Grab bars in the bathtub or shower?: (!) no Have non-skid surface in bathtub or shower?: (!) no Good home lighting?: yes Regular seat belt use?: yes Hospital stays in the last year:: no  Cognitive Assessment Difficulty concentrating, remembering, or making decisions? : no Will 6CIT or Mini Cog be Completed: yes What year is it?: 0 points What month is it?: 0 points Give patient an address phrase to remember (5 components): [address] [place]  About what time is it?: 0 points Count backwards from 20 to 1: 0 points Say the months of the year in reverse: 0 points Repeat the address phrase from earlier: 0 points 6 CIT Score: 0 points  Advance Directives (For Healthcare) Does Patient Have a Medical Advance Directive?: Yes Does patient want to make changes to medical advance directive?: No - Patient declined Type of Advance Directive: Healthcare Power of [place]; Living will Copy of Healthcare Power of Attorney in Chart?: Yes - validated most recent copy scanned in chart (See row information) Copy of Living Will in Chart?: Yes - validated most recent copy scanned in chart (See row information)  Reviewed/Updated  Reviewed/Updated: Reviewed All (Medical, Surgical, Family, Medications, Allergies, Care Teams, Patient Goals)    Allergies (verified) Iodine; Valtrex  [valacyclovir  hcl]; Anesthetics, amide; Benadryl  allergy [diphenhydramine  hcl (sleep)]; Diphenhydramine ; Fenofibrate ; and Sulfonamide derivatives   Current Medications (verified) Outpatient Encounter Medications as of  05/24/2024  Medication Sig   aspirin  81 MG tablet Take  81 mg by mouth daily.   atorvastatin  (LIPITOR) 20 MG tablet Take 0.5 tablets (10 mg total) by mouth daily.   Cholecalciferol (VITAMIN D ) 1000 UNITS capsule Take 5,000 Units by mouth daily.   Coconut Oil 1000 MG CAPS Take 1,000 mg by mouth daily.    Coenzyme Q10 (COQ10) 100 MG CAPS Take 1 capsule by mouth daily.   COLLAGEN PO Take 1 capsule by mouth daily.   cyclobenzaprine  (FLEXERIL ) 10 MG tablet TAKE 1 TABLET (10 MG TOTAL) BY MOUTH DAILY.   estradiol  (ESTRACE ) 0.5 MG tablet Take 0.5 tablets (0.25 mg total) by mouth every other day.   famciclovir  (FAMVIR ) 250 MG tablet Take 0.5 tablets (125 mg total) by mouth daily.   Flaxseed, Linseed, (FLAXSEED OIL PO) Take by mouth.   levothyroxine  (SYNTHROID ) 50 MCG tablet TAKE 1 TABLET (50 MCG TOTAL) BY MOUTH DAILY.   lisinopril  (ZESTRIL ) 2.5 MG tablet TAKE ONE (1) TABLET BY MOUTH EVERY DAY   MAGNESIUM PO Take 70 mg by mouth daily.   Multiple Vitamins-Minerals (ZINC PO) Take by mouth daily.   NON FORMULARY Take 2 tablets by mouth daily. Bone-Up: Calcium  Supplement   vitamin C (ASCORBIC ACID) 500 MG tablet Take 1,000 mg by mouth daily.   vitamin E 400 UNIT capsule Take 400 Units by mouth daily.   Zinc 50 MG TABS Take 1 tablet by mouth daily in the afternoon.   No facility-administered encounter medications on file as of 05/24/2024.    History: Past Medical History:  Diagnosis Date   Acute asthmatic bronchitis    Allergy    Anxiety    Cataract    Fibromyalgia    Herpes simplex type 2 infection 03/30/2016   Hypercholesterolemia    Hypothyroidism    Interstitial cystitis    Past Surgical History:  Procedure Laterality Date   LEFT HEART CATHETERIZATION WITH CORONARY ANGIOGRAM Bilateral 03/13/2014   Procedure: LEFT HEART CATHETERIZATION WITH CORONARY ANGIOGRAM;  Surgeon: Ozell JONETTA Fell, MD;  Location: [facility] CATH LAB;  Service: Cardiovascular;  Laterality: Bilateral;   TOTAL ABDOMINAL  HYSTERECTOMY  1997   Family History  Problem Relation Age of Onset   Other Mother        bronchiectasis   Pneumonia Mother    Heart failure Mother    Alzheimer's disease Father    Stroke Father    Heart failure Father    Pancreatic cancer Brother    Hyperlipidemia Sister    Other Sister        bronchiectasis   Social History   Occupational History   Occupation: engineer, site - substitute teaching now  Tobacco Use   Smoking status: Never   Smokeless tobacco: Never  Vaping Use   Vaping status: Never Used  Substance and Sexual Activity   Alcohol use: Not Currently    Comment: Rare   Drug use: No   Sexual activity: Not Currently    Birth control/protection: Abstinence   Tobacco Counseling Counseling given: No  SDOH Screenings   Food Insecurity: No Food Insecurity (05/24/2024)  Housing: Unknown (05/24/2024)  Transportation Needs: No Transportation Needs (05/24/2024)  Utilities: Not At Risk (05/24/2024)  Alcohol Screen: Low Risk (05/16/2023)  Depression (PHQ2-9): Low Risk (05/24/2024)  Financial Resource Strain: Low Risk (04/04/2024)  Physical Activity: Insufficiently Active (05/24/2024)  Social Connections: Moderately Integrated (05/24/2024)  Stress: No Stress Concern Present (05/24/2024)  Tobacco Use: Low Risk (05/24/2024)  Health Literacy: Adequate Health Literacy (05/24/2024)   See flowsheets for full screening details  Depression Screen PHQ  2 & 9 Depression Scale- Over the past 2 weeks, how often have you been bothered by any of the following problems? Little interest or pleasure in doing things: 0 Feeling down, depressed, or hopeless (PHQ Adolescent also includes...irritable): 0 PHQ-2 Total Score: 0     Goals Addressed               This Visit's Progress     Remain active (pt-stated)               Objective:    Today's Vitals   05/24/24 1131  Weight: 119 lb (54 kg)  Height: 5' 2.5 (1.588 m)   Body mass index is 21.42  kg/m.  Hearing/Vision screen Hearing Screening - Comments:: Denies hearing difficulties   Vision Screening - Comments:: Wears rx glasses - up to date with routine eye exams with  Dr DeAllen Immunizations and Health Maintenance Health Maintenance  Topic Date Due   Pneumococcal Vaccine: 50+ Years (1 of 1 - PCV) Never done   Colonoscopy  06/14/2020   Influenza Vaccine  Never done   COLON CANCER SCREENING ANNUAL FOBT  05/04/2024   Bone Density Scan  05/04/2025   Medicare Annual Wellness (AWV)  05/24/2025   Mammogram  04/27/2026   DTaP/Tdap/Td (3 - Td or Tdap) 01/13/2032   Hepatitis C Screening  Completed   Meningococcal B Vaccine  Aged Out   COVID-19 Vaccine  Discontinued   Zoster Vaccines- Shingrix  Discontinued        Assessment/Plan:  This is a routine wellness examination for [facility].  Patient Care Team: Antonio Meth, Jamee SAUNDERS, DO as PCP - General (Family Medicine) Pietro Redell RAMAN, MD as PCP - Cardiology (Cardiology) Christi Glendia HERO, MD as Consulting Physician (Pulmonary Disease) Pietro Redell RAMAN, MD as Consulting Physician (Cardiology) Estelle Service, MD as Consulting Physician (Obstetrics and Gynecology) Gaston Glendia, MD as Consulting Physician (Urology)  I have personally reviewed and noted the following in the patients chart:   Medical and social history Use of alcohol, tobacco or illicit drugs  Current medications and supplements including opioid prescriptions. Functional ability and status Nutritional status Physical activity Advanced directives List of other physicians Hospitalizations, surgeries, and ER visits in previous 12 months Vitals Screenings to include cognitive, depression, and falls Referrals and appointments  No orders of the defined types were placed in this encounter.  In addition, I have reviewed and discussed with patient certain preventive protocols, quality metrics, and best practice recommendations. A written personalized care plan  for preventive services as well as general preventive health recommendations were provided to patient.   Rojelio LELON Blush, LPN   [id number]/[id number]   Return in 53 weeks (on 05/30/2025).  After Visit Summary: (MyChart) Due to this being a telephonic visit, the after visit summary with patients personalized plan was offered to patient via MyChart   Nurse Notes: No voiced or noted concerns at this time

## 2024-05-24 NOTE — Telephone Encounter (Signed)
 Copied from CRM #[id number]. Topic: Appointments - Scheduling Inquiry for Clinic >> May 24, 2024 12:03 PM Jasmin G wrote: Reason for CRM: Pt requested a call back from clinic at [phone] regarding upcoming appt.

## 2024-05-24 NOTE — Patient Instructions (Addendum)
 Jaclyn Bennett,  Thank you for taking the time for your Medicare Wellness Visit. I appreciate your continued commitment to your health goals. Please review the care plan we discussed, and feel free to reach out if I can assist you further.  Please note that Annual Wellness Visits do not include a physical exam. Some assessments may be limited, especially if the visit was conducted virtually. If needed, we may recommend an in-person follow-up with your provider.  Ongoing Care Seeing your primary care provider every 3 to 6 months helps us  monitor your health and provide consistent, personalized care.    Referrals If a referral was made during today's visit and you haven't received any updates within two weeks, please contact the referred provider directly to check on the status.  Recommended Screenings:  Health Maintenance  Topic Date Due   Pneumococcal Vaccine for age over 55 (1 of 1 - PCV) Never done   Colon Cancer Screening  06/14/2020   Flu Shot  Never done   Stool Blood Test  05/04/2024   Osteoporosis screening with Bone Density Scan  05/04/2025   Medicare Annual Wellness Visit  05/24/2025   Breast Cancer Screening  04/27/2026   DTaP/Tdap/Td vaccine (3 - Td or Tdap) 01/13/2032   Hepatitis C Screening  Completed   Meningitis B Vaccine  Aged Out   COVID-19 Vaccine  Discontinued   Zoster (Shingles) Vaccine  Discontinued       05/24/2024   11:31 AM  Advanced Directives  Does Patient Have a Medical Advance Directive? Yes  Type of Estate Agent of [place];Living will  Does patient want to make changes to medical advance directive? No - Patient declined  Copy of Healthcare Power of Attorney in Chart? Yes - validated most recent copy scanned in chart (See row information)    Vision: Annual vision screenings are recommended for early detection of glaucoma, cataracts, and diabetic retinopathy. These exams can also reveal signs of chronic conditions such as  diabetes and high blood pressure.  Dental: Annual dental screenings help detect early signs of oral cancer, gum disease, and other conditions linked to overall health, including heart disease and diabetes.  Please see the attached documents for additional preventive care recommendations.

## 2024-06-11 ENCOUNTER — Other Ambulatory Visit: Payer: Self-pay | Admitting: Family Medicine

## 2024-06-25 ENCOUNTER — Encounter: Payer: Self-pay | Admitting: Family Medicine

## 2024-06-25 ENCOUNTER — Other Ambulatory Visit: Payer: Self-pay | Admitting: Family Medicine

## 2024-06-25 ENCOUNTER — Ambulatory Visit: Admitting: Family Medicine

## 2024-06-25 VITALS — BP 110/78 | HR 86 | Temp 98.0°F | Resp 16 | Ht 62.5 in | Wt 118.4 lb

## 2024-06-25 DIAGNOSIS — E039 Hypothyroidism, unspecified: Secondary | ICD-10-CM

## 2024-06-25 DIAGNOSIS — E785 Hyperlipidemia, unspecified: Secondary | ICD-10-CM

## 2024-06-25 MED ORDER — LEVOTHYROXINE SODIUM 50 MCG PO TABS: 50.0000 ug | ORAL_TABLET | Freq: Every day | ORAL | 0 refills | Status: DC

## 2024-06-25 MED ORDER — ATORVASTATIN CALCIUM 20 MG PO TABS: 10.0000 mg | ORAL_TABLET | Freq: Every day | ORAL | 3 refills | Status: AC

## 2024-06-25 MED ORDER — LEVOTHYROXINE SODIUM 50 MCG PO TABS: 50.0000 ug | ORAL_TABLET | Freq: Every day | ORAL | 3 refills | Status: AC

## 2024-06-25 NOTE — Progress Notes (Unsigned)
 "  Subjective:    Patient ID: Ronal Wonda Reef, female    DOB: 20-Oct-1950, 74 y.o.   MRN: [id number]  No chief complaint on file.   HPI Patient is in today for *** Discussed the use of AI scribe software for clinical note transcription with the patient, who gave verbal consent to proceed.  History of Present Illness    Past Medical History:  Diagnosis Date   Acute asthmatic bronchitis    Allergy    Anxiety    Cataract    Fibromyalgia    Herpes simplex type 2 infection 03/30/2016   Hypercholesterolemia    Hypothyroidism    Interstitial cystitis     Past Surgical History:  Procedure Laterality Date   LEFT HEART CATHETERIZATION WITH CORONARY ANGIOGRAM Bilateral 03/13/2014   Procedure: LEFT HEART CATHETERIZATION WITH CORONARY ANGIOGRAM;  Surgeon: Ozell JONETTA Fell, MD;  Location: [facility] CATH LAB;  Service: Cardiovascular;  Laterality: Bilateral;   TOTAL ABDOMINAL HYSTERECTOMY  1997    Family History  Problem Relation Age of Onset   Other Mother        bronchiectasis   Pneumonia Mother    Heart failure Mother    Alzheimer's disease Father    Stroke Father    Heart failure Father    Pancreatic cancer Brother    Hyperlipidemia Sister    Other Sister        bronchiectasis    Social History   Socioeconomic History   Marital status: Divorced    Spouse name: Not on file   Number of children: 3   Years of education: Not on file   Highest education level: Bachelor's degree (e.g., BA, AB, BS)  Occupational History   Occupation: engineer, site - substitute teaching now  Tobacco Use   Smoking status: Never   Smokeless tobacco: Never  Vaping Use   Vaping status: Never Used  Substance and Sexual Activity   Alcohol use: Not Currently    Comment: Rare   Drug use: No   Sexual activity: Not Currently    Birth control/protection: Abstinence  Other Topics Concern   Not on file  Social History Narrative   Plays piano, 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   Declines flu and pneumonia shot  04-30-10   Social Drivers of Health   Tobacco Use: Low Risk (06/25/2024)   Patient History    Smoking Tobacco Use: Never    Smokeless Tobacco Use: Never    Passive Exposure: Not on file  Financial Resource Strain: Low Risk (04/04/2024)   Overall Financial Resource Strain (CARDIA)    Difficulty of Paying Living Expenses: Not hard at all  Food Insecurity: No Food Insecurity (05/24/2024)   Epic    Worried About Radiation Protection Practitioner of Food in the Last Year: Never true    Ran Out of Food in the Last Year: Never true  Transportation Needs: No Transportation Needs (05/24/2024)   Epic    Lack of Transportation (Medical): No    Lack of Transportation (Non-Medical): No  Physical Activity: Insufficiently Active (05/24/2024)   Exercise Vital Sign    Days of Exercise per Week: 3 days    Minutes of Exercise per Session: 40 min  Stress: No Stress Concern Present (05/24/2024)   [employer] of Occupational Health - Occupational Stress Questionnaire    Feeling of Stress: Not at all  Social Connections: Moderately Integrated (05/24/2024)   Social Connection and Isolation Panel    Frequency of Communication with Friends and Family: More than three times  a week    Frequency of Social Gatherings with Friends and Family: More than three times a week    Attends Religious Services: More than 4 times per year    Active Member of Clubs or Organizations: Yes    Attends Banker Meetings: More than 4 times per year    Marital Status: Divorced  Intimate Partner Violence: Not At Risk (05/24/2024)   Epic    Fear of Current or Ex-Partner: No    Emotionally Abused: No    Physically Abused: No    Sexually Abused: No  Depression (PHQ2-9): Low Risk (05/24/2024)   Depression (PHQ2-9)    PHQ-2 Score: 0  Alcohol Screen: Low Risk (05/16/2023)   Alcohol Screen    Last Alcohol Screening Score (AUDIT): 0  Housing: Unknown (05/24/2024)   Epic    Unable to Pay for Housing in the Last Year: No    Number  of Times Moved in the Last Year: Not on file    Homeless in the Last Year: No  Utilities: Not At Risk (05/24/2024)   Epic    Threatened with loss of utilities: No  Health Literacy: Adequate Health Literacy (05/24/2024)   B1300 Health Literacy    Frequency of need for help with medical instructions: Never    Outpatient Medications Prior to Visit  Medication Sig Dispense Refill   aspirin  81 MG tablet Take 81 mg by mouth daily.     Cholecalciferol (VITAMIN D ) 1000 UNITS capsule Take 5,000 Units by mouth daily.     Coconut Oil 1000 MG CAPS Take 1,000 mg by mouth daily.      Coenzyme Q10 (COQ10) 100 MG CAPS Take 1 capsule by mouth daily.     COLLAGEN PO Take 1 capsule by mouth daily.     cyclobenzaprine  (FLEXERIL ) 10 MG tablet TAKE 1 TABLET (10 MG TOTAL) BY MOUTH DAILY. 90 tablet 1   estradiol  (ESTRACE ) 0.5 MG tablet Take 0.5 tablets (0.25 mg total) by mouth every other day. 30 tablet 5   famciclovir  (FAMVIR ) 250 MG tablet Take 0.5 tablets (125 mg total) by mouth daily. 15 tablet 3   Flaxseed, Linseed, (FLAXSEED OIL PO) Take by mouth.     lisinopril  (ZESTRIL ) 2.5 MG tablet TAKE ONE (1) TABLET BY MOUTH EVERY DAY 90 tablet 2   MAGNESIUM PO Take 70 mg by mouth daily.     Multiple Vitamins-Minerals (ZINC PO) Take by mouth daily.     NON FORMULARY Take 2 tablets by mouth daily. Bone-Up: Calcium  Supplement     vitamin C (ASCORBIC ACID) 500 MG tablet Take 1,000 mg by mouth daily.     vitamin E 400 UNIT capsule Take 400 Units by mouth daily.     Zinc 50 MG TABS Take 1 tablet by mouth daily in the afternoon.     atorvastatin  (LIPITOR) 20 MG tablet Take 0.5 tablets (10 mg total) by mouth daily. 45 tablet 0   levothyroxine  (SYNTHROID ) 50 MCG tablet Take 1 tablet (50 mcg total) by mouth daily before breakfast. Needs appt 30 tablet 0   No facility-administered medications prior to visit.    Allergies[1]  ROS     Objective:    Physical Exam  There were no vitals taken for this visit. Wt  Readings from Last 3 Encounters:  06/25/24 118 lb 6.4 oz (53.7 kg)  05/24/24 119 lb (54 kg)  04/05/24 119 lb 3.2 oz (54.1 kg)    Diabetic Foot Exam - Simple   No data  filed    Lab Results  Component Value Date   WBC 6.1 12/06/2023   HGB 13.6 12/06/2023   HCT 40.4 12/06/2023   PLT 344.0 12/06/2023   GLUCOSE 104 (H) 12/06/2023   CHOL 172 12/06/2023   TRIG 245.0 (H) 12/06/2023   HDL 44.80 12/06/2023   LDLDIRECT 89.0 10/21/2022   LDLCALC 78 12/06/2023   ALT 26 12/06/2023   AST 25 12/06/2023   NA 140 12/06/2023   K 3.9 12/06/2023   CL 101 12/06/2023   CREATININE 0.82 12/06/2023   BUN 20 12/06/2023   CO2 31 12/06/2023   TSH 4.05 02/21/2024   INR 1.03 03/04/2014   HGBA1C 5.8 08/12/2016    Lab Results  Component Value Date   TSH 4.05 02/21/2024   Lab Results  Component Value Date   WBC 6.1 12/06/2023   HGB 13.6 12/06/2023   HCT 40.4 12/06/2023   MCV 91.6 12/06/2023   PLT 344.0 12/06/2023   Lab Results  Component Value Date   NA 140 12/06/2023   K 3.9 12/06/2023   CO2 31 12/06/2023   GLUCOSE 104 (H) 12/06/2023   BUN 20 12/06/2023   CREATININE 0.82 12/06/2023   BILITOT 0.3 12/06/2023   ALKPHOS 104 12/06/2023   AST 25 12/06/2023   ALT 26 12/06/2023   PROT 6.8 12/06/2023   ALBUMIN 4.4 12/06/2023   CALCIUM  9.7 12/06/2023   EGFR 51 (L) 02/25/2022   GFR 70.98 12/06/2023   Lab Results  Component Value Date   CHOL 172 12/06/2023   Lab Results  Component Value Date   HDL 44.80 12/06/2023   Lab Results  Component Value Date   LDLCALC 78 12/06/2023   Lab Results  Component Value Date   TRIG 245.0 (H) 12/06/2023   Lab Results  Component Value Date   CHOLHDL 4 12/06/2023   Lab Results  Component Value Date   HGBA1C 5.8 08/12/2016       Assessment & Plan:  Hypothyroidism, unspecified type -     Levothyroxine  Sodium; Take 1 tablet (50 mcg total) by mouth daily before breakfast. Needs appt  Dispense: 90 tablet; Refill: 3 -     Comprehensive  metabolic panel with GFR -     Lipid panel -     TSH  Hyperlipidemia LDL goal <100 -     Atorvastatin  Calcium ; Take 0.5 tablets (10 mg total) by mouth daily.  Dispense: 45 tablet; Refill: 3 -     Comprehensive metabolic panel with GFR -     Lipid panel -     TSH  Assessment and Plan Assessment & Plan      Jamee JONELLE Shanks Chase, DO    [1]  Allergies Allergen Reactions   Iodine Anaphylaxis   Valtrex  [Valacyclovir  Hcl] Other (See Comments)    Severe nausea    Anesthetics, Amide Nausea And Vomiting   Benadryl  Allergy [Diphenhydramine  Hcl (Sleep)]     Makes patient hyper   Diphenhydramine     Fenofibrate      Elevated liver func Decc kidney function   Sulfonamide Derivatives     REACTION: throat swelling and itching   "

## 2024-06-26 LAB — CBC WITH DIFFERENTIAL/PLATELET
Basophils Absolute: 0 K/uL (ref 0.0–0.1)
Basophils Relative: 0.3 % (ref 0.0–3.0)
Eosinophils Absolute: 0.1 K/uL (ref 0.0–0.7)
Eosinophils Relative: 2.6 % (ref 0.0–5.0)
HCT: 38.3 % (ref 36.0–46.0)
Hemoglobin: 13.1 g/dL (ref 12.0–15.0)
Lymphocytes Relative: 22.4 % (ref 12.0–46.0)
Lymphs Abs: 1.3 K/uL (ref 0.7–4.0)
MCHC: 34.1 g/dL (ref 30.0–36.0)
MCV: 93.8 fl (ref 78.0–100.0)
Monocytes Absolute: 0.5 K/uL (ref 0.1–1.0)
Monocytes Relative: 8.3 % (ref 3.0–12.0)
Neutro Abs: 3.9 K/uL (ref 1.4–7.7)
Neutrophils Relative %: 66.4 % (ref 43.0–77.0)
Platelets: 316 K/uL (ref 150.0–400.0)
RBC: 4.09 Mil/uL (ref 3.87–5.11)
RDW: 12.9 % (ref 11.5–15.5)
WBC: 5.8 K/uL (ref 4.0–10.5)

## 2024-06-26 LAB — COMPREHENSIVE METABOLIC PANEL WITH GFR
ALT: 25 U/L (ref 3–35)
AST: 24 U/L (ref 5–37)
Albumin: 4.2 g/dL (ref 3.5–5.2)
Alkaline Phosphatase: 90 U/L (ref 39–117)
BUN: 20 mg/dL (ref 6–23)
CO2: 31 meq/L (ref 19–32)
Calcium: 9.5 mg/dL (ref 8.4–10.5)
Chloride: 103 meq/L (ref 96–112)
Creatinine, Ser: 0.85 mg/dL (ref 0.40–1.20)
GFR: 67.72 mL/min
Glucose, Bld: 94 mg/dL (ref 70–99)
Potassium: 3.9 meq/L (ref 3.5–5.1)
Sodium: 142 meq/L (ref 135–145)
Total Bilirubin: 0.3 mg/dL (ref 0.2–1.2)
Total Protein: 6.7 g/dL (ref 6.0–8.3)

## 2024-06-26 LAB — LIPID PANEL
Cholesterol: 156 mg/dL (ref 28–200)
HDL: 47.7 mg/dL
LDL Cholesterol: 67 mg/dL (ref 10–99)
NonHDL: 108.56
Total CHOL/HDL Ratio: 3
Triglycerides: 207 mg/dL — ABNORMAL HIGH (ref 10.0–149.0)
VLDL: 41.4 mg/dL — ABNORMAL HIGH (ref 0.0–40.0)

## 2024-06-26 LAB — TSH: TSH: 3.58 u[IU]/mL (ref 0.35–5.50)

## 2024-06-27 NOTE — Assessment & Plan Note (Signed)
 Encourage heart healthy diet such as MIND or DASH diet, increase exercise, avoid trans fats, simple carbohydrates and processed foods, consider a krill or fish or flaxseed oil cap daily.

## 2024-06-27 NOTE — Patient Instructions (Signed)

## 2024-06-27 NOTE — Assessment & Plan Note (Signed)
Check thyroid  Con't synthroid

## 2024-06-27 NOTE — Progress Notes (Signed)
 "  Subjective:    Patient ID: Jaclyn Bennett, female    DOB: 1951-03-21, 74 y.o.   MRN: [id number]  Chief Complaint  Patient presents with   Hypertension   Hypothyroidism   Follow-up    HPI Patient is in today for f/u.  Discussed the use of AI scribe software for clinical note transcription with the patient, who gave verbal consent to proceed.  History of Present Illness Jaclyn Bennett is a 74 year old female who presents for a medication refill and clarification of her medical records.  She is seeking a refill for her levothyroxine , which she attempted to obtain on December 29th. She was informed by a CMA named Kalen that she needed to come in for an appointment, despite being told in June that she did not need to return until June of the following year due to an upcoming appointment with her cardiologist. She has not received a call back from Kalen after leaving her phone number.  Her prescription for levothyroxine  was sent in for only 30 pills, and she requires a prescription for atorvastatin  as well. She mentions having two refills left for famciclovir  but does not take it frequently. She takes estradiol  half a pill once a week or every two weeks and saw her gynecologist, Dr. Nathanel Bunker, on November 13th.  She had a mammogram on November 14th and a dental check-up on November 25th, both of which had good outcomes. She is scheduled to see Dr. Pietro on January 28th. She had a colon cancer screening in November 2024, but her records incorrectly state it was in 2022. She also completed a Cologuard test, which returned fine results. She had a colonoscopy years ago, which was described as 'pristine' by the doctor.  She expresses frustration with the communication issues with the medical office, including incorrect information in her records and difficulty reaching the office directly. Her wellness visit was conducted over the phone on December 11th with a nurse named Rojelio ORN.,  who is not part of the office but involved in chronic disease management. Rojelio incorrectly noted the date of her colon cancer screening and stool blood test.  She describes her active lifestyle, including a recent four-hour drive to [facility] and managing home renovations, which involved significant physical activity. She emphasizes her good health and desire to maintain it.    Past Medical History:  Diagnosis Date   Acute asthmatic bronchitis    Allergy    Anxiety    Cataract    Fibromyalgia    Herpes simplex type 2 infection 03/30/2016   Hypercholesterolemia    Hypothyroidism    Interstitial cystitis     Past Surgical History:  Procedure Laterality Date   LEFT HEART CATHETERIZATION WITH CORONARY ANGIOGRAM Bilateral 03/13/2014   Procedure: LEFT HEART CATHETERIZATION WITH CORONARY ANGIOGRAM;  Surgeon: Ozell JONETTA Fell, MD;  Location: [facility] CATH LAB;  Service: Cardiovascular;  Laterality: Bilateral;   TOTAL ABDOMINAL HYSTERECTOMY  1997    Family History  Problem Relation Age of Onset   Other Mother        bronchiectasis   Pneumonia Mother    Heart failure Mother    Alzheimer's disease Father    Stroke Father    Heart failure Father    Pancreatic cancer Brother    Hyperlipidemia Sister    Other Sister        bronchiectasis    Social History   Socioeconomic History   Marital status: Divorced    Spouse  name: Not on file   Number of children: 3   Years of education: Not on file   Highest education level: Bachelor's degree (e.g., BA, AB, BS)  Occupational History   Occupation: engineer, site - substitute teaching now  Tobacco Use   Smoking status: Never   Smokeless tobacco: Never  Vaping Use   Vaping status: Never Used  Substance and Sexual Activity   Alcohol use: Not Currently    Comment: Rare   Drug use: No   Sexual activity: Not Currently    Birth control/protection: Abstinence  Other Topics Concern   Not on file  Social History Narrative   Plays piano,  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   Declines flu and pneumonia shot 04-30-10   Social Drivers of Health   Tobacco Use: Low Risk (06/25/2024)   Patient History    Smoking Tobacco Use: Never    Smokeless Tobacco Use: Never    Passive Exposure: Not on file  Financial Resource Strain: Low Risk (04/04/2024)   Overall Financial Resource Strain (CARDIA)    Difficulty of Paying Living Expenses: Not hard at all  Food Insecurity: No Food Insecurity (05/24/2024)   Epic    Worried About Radiation Protection Practitioner of Food in the Last Year: Never true    Ran Out of Food in the Last Year: Never true  Transportation Needs: No Transportation Needs (05/24/2024)   Epic    Lack of Transportation (Medical): No    Lack of Transportation (Non-Medical): No  Physical Activity: Insufficiently Active (05/24/2024)   Exercise Vital Sign    Days of Exercise per Week: 3 days    Minutes of Exercise per Session: 40 min  Stress: No Stress Concern Present (05/24/2024)   [employer] of Occupational Health - Occupational Stress Questionnaire    Feeling of Stress: Not at all  Social Connections: Moderately Integrated (05/24/2024)   Social Connection and Isolation Panel    Frequency of Communication with Friends and Family: More than three times a week    Frequency of Social Gatherings with Friends and Family: More than three times a week    Attends Religious Services: More than 4 times per year    Active Member of Clubs or Organizations: Yes    Attends Banker Meetings: More than 4 times per year    Marital Status: Divorced  Intimate Partner Violence: Not At Risk (05/24/2024)   Epic    Fear of Current or Ex-Partner: No    Emotionally Abused: No    Physically Abused: No    Sexually Abused: No  Depression (PHQ2-9): Low Risk (05/24/2024)   Depression (PHQ2-9)    PHQ-2 Score: 0  Alcohol Screen: Low Risk (05/16/2023)   Alcohol Screen    Last Alcohol Screening Score (AUDIT): 0  Housing: Unknown (05/24/2024)   Epic     Unable to Pay for Housing in the Last Year: No    Number of Times Moved in the Last Year: Not on file    Homeless in the Last Year: No  Utilities: Not At Risk (05/24/2024)   Epic    Threatened with loss of utilities: No  Health Literacy: Adequate Health Literacy (05/24/2024)   B1300 Health Literacy    Frequency of need for help with medical instructions: Never    Outpatient Medications Prior to Visit  Medication Sig Dispense Refill   aspirin  81 MG tablet Take 81 mg by mouth daily.     Cholecalciferol (VITAMIN D ) 1000 UNITS capsule Take 5,000 Units by mouth  daily.     Coconut Oil 1000 MG CAPS Take 1,000 mg by mouth daily.      Coenzyme Q10 (COQ10) 100 MG CAPS Take 1 capsule by mouth daily.     COLLAGEN PO Take 1 capsule by mouth daily.     cyclobenzaprine  (FLEXERIL ) 10 MG tablet TAKE 1 TABLET (10 MG TOTAL) BY MOUTH DAILY. 90 tablet 1   estradiol  (ESTRACE ) 0.5 MG tablet Take 0.5 tablets (0.25 mg total) by mouth every other day. 30 tablet 5   famciclovir  (FAMVIR ) 250 MG tablet Take 0.5 tablets (125 mg total) by mouth daily. 15 tablet 3   Flaxseed, Linseed, (FLAXSEED OIL PO) Take by mouth.     lisinopril  (ZESTRIL ) 2.5 MG tablet TAKE ONE (1) TABLET BY MOUTH EVERY DAY 90 tablet 2   MAGNESIUM PO Take 70 mg by mouth daily.     Multiple Vitamins-Minerals (ZINC PO) Take by mouth daily.     NON FORMULARY Take 2 tablets by mouth daily. Bone-Up: Calcium  Supplement     vitamin C (ASCORBIC ACID) 500 MG tablet Take 1,000 mg by mouth daily.     vitamin E 400 UNIT capsule Take 400 Units by mouth daily.     Zinc 50 MG TABS Take 1 tablet by mouth daily in the afternoon.     atorvastatin  (LIPITOR) 20 MG tablet Take 0.5 tablets (10 mg total) by mouth daily. 45 tablet 0   levothyroxine  (SYNTHROID ) 50 MCG tablet Take 1 tablet (50 mcg total) by mouth daily before breakfast. Needs appt 30 tablet 0   No facility-administered medications prior to visit.    Allergies  Allergen Reactions   Iodine Anaphylaxis    Valtrex  [Valacyclovir  Hcl] Other (See Comments)    Severe nausea    Anesthetics, Amide Nausea And Vomiting   Benadryl  Allergy [Diphenhydramine  Hcl (Sleep)]     Makes patient hyper   Diphenhydramine     Fenofibrate      Elevated liver func Decc kidney function   Sulfonamide Derivatives     REACTION: throat swelling and itching    Review of Systems  Constitutional:  Negative for fever and malaise/fatigue.  HENT:  Negative for congestion.   Eyes:  Negative for blurred vision.  Respiratory:  Negative for shortness of breath.   Cardiovascular:  Negative for chest pain, palpitations and leg swelling.  Gastrointestinal:  Negative for abdominal pain, blood in stool and nausea.  Genitourinary:  Negative for dysuria and frequency.  Musculoskeletal:  Negative for falls.  Skin:  Negative for rash.  Neurological:  Negative for dizziness, loss of consciousness and headaches.  Endo/Heme/Allergies:  Negative for environmental allergies.  Psychiatric/Behavioral:  Negative for depression. The patient is not nervous/anxious.        Objective:    Physical Exam Vitals and nursing note reviewed.  Constitutional:      General: She is not in acute distress.    Appearance: Normal appearance. She is well-developed.  HENT:     Head: Normocephalic and atraumatic.     Right Ear: Tympanic membrane, ear canal and external ear normal. There is no impacted cerumen.     Left Ear: Tympanic membrane, ear canal and external ear normal. There is no impacted cerumen.     Nose: Nose normal.     Mouth/Throat:     Mouth: Mucous membranes are moist.     Pharynx: Oropharynx is clear. No oropharyngeal exudate or posterior oropharyngeal erythema.  Eyes:     General: No scleral icterus.  Right eye: No discharge.        Left eye: No discharge.     Conjunctiva/sclera: Conjunctivae normal.     Pupils: Pupils are equal, round, and reactive to light.  Neck:     Thyroid : No thyromegaly or thyroid  tenderness.      Vascular: No JVD.  Cardiovascular:     Rate and Rhythm: Normal rate and regular rhythm.     Heart sounds: Normal heart sounds. No murmur heard. Pulmonary:     Effort: Pulmonary effort is normal. No respiratory distress.     Breath sounds: Normal breath sounds.  Abdominal:     General: Bowel sounds are normal. There is no distension.     Palpations: Abdomen is soft. There is no mass.     Tenderness: There is no abdominal tenderness. There is no guarding or rebound.  Musculoskeletal:        General: Normal range of motion.     Cervical back: Normal range of motion and neck supple.     Right lower leg: No edema.     Left lower leg: No edema.  Lymphadenopathy:     Cervical: No cervical adenopathy.  Skin:    General: Skin is warm and dry.     Findings: No erythema or rash.  Neurological:     Mental Status: She is alert and oriented to person, place, and time.     Cranial Nerves: No cranial nerve deficit.     Deep Tendon Reflexes: Reflexes are normal and symmetric.  Psychiatric:        Mood and Affect: Mood normal.        Behavior: Behavior normal.        Thought Content: Thought content normal.        Judgment: Judgment normal.     BP 110/78 (BP Location: Left Arm, Patient Position: Sitting, Cuff Size: Normal)   Pulse 86   Temp 98 F (36.7 C) (Oral)   Resp 16   Ht 5' 2.5 (1.588 m)   Wt 118 lb 6.4 oz (53.7 kg)   SpO2 99%   BMI 21.31 kg/m  Wt Readings from Last 3 Encounters:  06/25/24 118 lb 6.4 oz (53.7 kg)  05/24/24 119 lb (54 kg)  04/05/24 119 lb 3.2 oz (54.1 kg)    Diabetic Foot Exam - Simple   No data filed    Lab Results  Component Value Date   WBC 5.8 06/25/2024   HGB 13.1 06/25/2024   HCT 38.3 06/25/2024   PLT 316.0 06/25/2024   GLUCOSE 94 06/25/2024   CHOL 156 06/25/2024   TRIG 207.0 (H) 06/25/2024   HDL 47.70 06/25/2024   LDLDIRECT 89.0 10/21/2022   LDLCALC 67 06/25/2024   ALT 25 06/25/2024   AST 24 06/25/2024   NA 142 06/25/2024   K 3.9  06/25/2024   CL 103 06/25/2024   CREATININE 0.85 06/25/2024   BUN 20 06/25/2024   CO2 31 06/25/2024   TSH 3.58 06/25/2024   INR 1.03 03/04/2014   HGBA1C 5.8 08/12/2016    Lab Results  Component Value Date   TSH 3.58 06/25/2024   Lab Results  Component Value Date   WBC 5.8 06/25/2024   HGB 13.1 06/25/2024   HCT 38.3 06/25/2024   MCV 93.8 06/25/2024   PLT 316.0 06/25/2024   Lab Results  Component Value Date   NA 142 06/25/2024   K 3.9 06/25/2024   CO2 31 06/25/2024   GLUCOSE 94 06/25/2024   BUN  20 06/25/2024   CREATININE 0.85 06/25/2024   BILITOT 0.3 06/25/2024   ALKPHOS 90 06/25/2024   AST 24 06/25/2024   ALT 25 06/25/2024   PROT 6.7 06/25/2024   ALBUMIN 4.2 06/25/2024   CALCIUM  9.5 06/25/2024   EGFR 51 (L) 02/25/2022   GFR 67.72 06/25/2024   Lab Results  Component Value Date   CHOL 156 06/25/2024   Lab Results  Component Value Date   HDL 47.70 06/25/2024   Lab Results  Component Value Date   LDLCALC 67 06/25/2024   Lab Results  Component Value Date   TRIG 207.0 (H) 06/25/2024   Lab Results  Component Value Date   CHOLHDL 3 06/25/2024   Lab Results  Component Value Date   HGBA1C 5.8 08/12/2016       Assessment & Plan:  Hyperlipidemia LDL goal <100 Assessment & Plan: Encourage heart healthy diet such as MIND or DASH diet, increase exercise, avoid trans fats, simple carbohydrates and processed foods, consider a krill or fish or flaxseed oil cap daily.    Orders: -     Comprehensive metabolic panel with GFR -     Lipid panel  Hypothyroidism, unspecified type Assessment & Plan: Check thyroid  Con't synthroid   Orders: -     CBC with Differential/Platelet -     Comprehensive metabolic panel with GFR -     TSH  Hyperlipidemia, unspecified hyperlipidemia type  Assessment and Plan Assessment & Plan Hypothyroidism   Managed with levothyroxine . A recent prescription issue was resolved, and a 90-day supply was sent to the  pharmacy.  Hyperlipidemia   A prescription issue was resolved with a new prescription sent to the pharmacy.  General Health Maintenance   Recent health maintenance activities include a normal mammogram on November 14th, 2025, and a normal dental visit on November 25th, 2025. Colon cancer screening with Cologuard in November 2024 returned normal results. She is scheduled to see her cardiologist, Dr. Pietro, on January 28th, 2026. Plan for a bone density test and a mammogram in the fall. Ensure follow-up with the cardiologist on January 28th, 2026.    Magnolia Mattila R Lowne Chase, DO "

## 2024-06-28 ENCOUNTER — Encounter: Payer: Self-pay | Admitting: Family Medicine

## 2024-06-29 ENCOUNTER — Ambulatory Visit: Payer: Self-pay | Admitting: Family Medicine

## 2024-06-29 NOTE — Progress Notes (Signed)
 "    HPI: FU cardiomyopathy; previously seen for atypical CP; stress echo 9/15 showed baseline EF 45; inferior, septal and apical HK both at rest and with stress. Cardiac cath 9/15 showed normal coronary arteries. Echocardiogram September 2023 showed normal LV function, grade 1 diastolic dysfunction. Since last seen, she has occasional pressure in her chest for seconds not related to activities.  She has dyspnea with vigorous activities but not routine activities.  No orthopnea, PND or pedal edema.  No syncope.  Current Outpatient Medications  Medication Sig Dispense Refill   aspirin  81 MG tablet Take 81 mg by mouth daily.     atorvastatin  (LIPITOR) 20 MG tablet Take 0.5 tablets (10 mg total) by mouth daily. 45 tablet 3   Cholecalciferol (VITAMIN D ) 1000 UNITS capsule Take 5,000 Units by mouth daily.     Coconut Oil 1000 MG CAPS Take 1,000 mg by mouth daily.      Coenzyme Q10 (COQ10) 100 MG CAPS Take 1 capsule by mouth daily.     COLLAGEN PO Take 1 capsule by mouth daily.     cyclobenzaprine  (FLEXERIL ) 10 MG tablet TAKE 1 TABLET (10 MG TOTAL) BY MOUTH DAILY. 90 tablet 1   estradiol  (ESTRACE ) 0.5 MG tablet Take 0.5 tablets (0.25 mg total) by mouth every other day. 30 tablet 5   famciclovir  (FAMVIR ) 250 MG tablet Take 0.5 tablets (125 mg total) by mouth daily. 15 tablet 3   Flaxseed, Linseed, (FLAXSEED OIL PO) Take by mouth.     levothyroxine  (SYNTHROID ) 50 MCG tablet Take 1 tablet (50 mcg total) by mouth daily before breakfast. Needs appt 90 tablet 3   lisinopril  (ZESTRIL ) 2.5 MG tablet TAKE ONE (1) TABLET BY MOUTH EVERY DAY 90 tablet 2   MAGNESIUM PO Take 70 mg by mouth daily.     Multiple Vitamins-Minerals (ZINC PO) Take by mouth daily.     NON FORMULARY Take 2 tablets by mouth daily. Bone-Up: Calcium  Supplement     vitamin C (ASCORBIC ACID) 500 MG tablet Take 1,000 mg by mouth daily.     vitamin E 400 UNIT capsule Take 400 Units by mouth daily.     Zinc 50 MG TABS Take 1 tablet by mouth  daily in the afternoon.     No current facility-administered medications for this visit.     Past Medical History:  Diagnosis Date   Acute asthmatic bronchitis    Allergy    Anxiety    Cataract    Fibromyalgia    Herpes simplex type 2 infection 03/30/2016   Hypercholesterolemia    Hypothyroidism    Interstitial cystitis     Past Surgical History:  Procedure Laterality Date   LEFT HEART CATHETERIZATION WITH CORONARY ANGIOGRAM Bilateral 03/13/2014   Procedure: LEFT HEART CATHETERIZATION WITH CORONARY ANGIOGRAM;  Surgeon: Ozell JONETTA Fell, MD;  Location: [facility] CATH LAB;  Service: Cardiovascular;  Laterality: Bilateral;   TOTAL ABDOMINAL HYSTERECTOMY  1997    Social History   Socioeconomic History   Marital status: Divorced    Spouse name: Not on file   Number of children: 3   Years of education: Not on file   Highest education level: Bachelor's degree (e.g., BA, AB, BS)  Occupational History   Occupation: engineer, site - substitute teaching now  Tobacco Use   Smoking status: Never   Smokeless tobacco: Never  Vaping Use   Vaping status: Never Used  Substance and Sexual Activity   Alcohol use: Not Currently    Comment: Rare  Drug use: No   Sexual activity: Not Currently    Birth control/protection: Abstinence  Other Topics Concern   Not on file  Social History Narrative   Plays piano, 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   Declines flu and pneumonia shot 04-30-10   Social Drivers of Health   Tobacco Use: Low Risk (07/11/2024)   Patient History    Smoking Tobacco Use: Never    Smokeless Tobacco Use: Never    Passive Exposure: Not on file  Financial Resource Strain: Low Risk (04/04/2024)   Overall Financial Resource Strain (CARDIA)    Difficulty of Paying Living Expenses: Not hard at all  Food Insecurity: No Food Insecurity (05/24/2024)   Epic    Worried About Radiation Protection Practitioner of Food in the Last Year: Never true    Ran Out of Food in the Last Year: Never true  Transportation  Needs: No Transportation Needs (05/24/2024)   Epic    Lack of Transportation (Medical): No    Lack of Transportation (Non-Medical): No  Physical Activity: Insufficiently Active (05/24/2024)   Exercise Vital Sign    Days of Exercise per Week: 3 days    Minutes of Exercise per Session: 40 min  Stress: No Stress Concern Present (05/24/2024)   [employer] of Occupational Health - Occupational Stress Questionnaire    Feeling of Stress: Not at all  Social Connections: Moderately Integrated (05/24/2024)   Social Connection and Isolation Panel    Frequency of Communication with Friends and Family: More than three times a week    Frequency of Social Gatherings with Friends and Family: More than three times a week    Attends Religious Services: More than 4 times per year    Active Member of Clubs or Organizations: Yes    Attends Banker Meetings: More than 4 times per year    Marital Status: Divorced  Intimate Partner Violence: Not At Risk (05/24/2024)   Epic    Fear of Current or Ex-Partner: No    Emotionally Abused: No    Physically Abused: No    Sexually Abused: No  Depression (PHQ2-9): Low Risk (05/24/2024)   Depression (PHQ2-9)    PHQ-2 Score: 0  Alcohol Screen: Low Risk (05/16/2023)   Alcohol Screen    Last Alcohol Screening Score (AUDIT): 0  Housing: Unknown (05/24/2024)   Epic    Unable to Pay for Housing in the Last Year: No    Number of Times Moved in the Last Year: Not on file    Homeless in the Last Year: No  Utilities: Not At Risk (05/24/2024)   Epic    Threatened with loss of utilities: No  Health Literacy: Adequate Health Literacy (05/24/2024)   B1300 Health Literacy    Frequency of need for help with medical instructions: Never    Family History  Problem Relation Age of Onset   Other Mother        bronchiectasis   Pneumonia Mother    Heart failure Mother    Alzheimer's disease Father    Stroke Father    Heart failure Father    Pancreatic  cancer Brother    Hyperlipidemia Sister    Other Sister        bronchiectasis    ROS: no fevers or chills, productive cough, hemoptysis, dysphasia, odynophagia, melena, hematochezia, dysuria, hematuria, rash, seizure activity, orthopnea, PND, pedal edema, claudication. Remaining systems are negative.  Physical Exam: Well-developed well-nourished in no acute distress.  Skin is warm and dry.  HEENT is  normal.  Neck is supple.  Chest is clear to auscultation with normal expansion.  Cardiovascular exam is regular rate and rhythm.  Abdominal exam nontender or distended. No masses palpated. Extremities show no edema. neuro grossly intact  EKG Interpretation Date/Time:  Wednesday July 11 2024 14:26:29 EST Ventricular Rate:  81 PR Interval:  150 QRS Duration:  118 QT Interval:  398 QTC Calculation: 462 R Axis:   -17  Text Interpretation: Normal sinus rhythm Cannot rule out Anterior infarct , age undetermined Non-specific ST-t changes Confirmed by Pietro Rogue ([id number]) on 07/11/2024 2:27:39 PM    A/P  1 nonischemic cardiomyopathy-LV function improved on most recent echocardiogram.  She is not on a beta-blocker due to history of fatigue.  Continue ACE inhibitor.  2 hyperlipidemia-continue statin.  3 dyspnea-this has been a chronic issue.  She is not volume overloaded on examination and most recent echocardiogram showed normal LV function.  Rogue Pietro, MD    "

## 2024-07-11 ENCOUNTER — Ambulatory Visit: Attending: Cardiology | Admitting: Cardiology

## 2024-07-11 ENCOUNTER — Encounter: Payer: Self-pay | Admitting: Cardiology

## 2024-07-11 VITALS — BP 122/64 | HR 80 | Ht 62.5 in | Wt 117.8 lb

## 2024-07-11 DIAGNOSIS — R0602 Shortness of breath: Secondary | ICD-10-CM | POA: Diagnosis not present

## 2024-07-11 DIAGNOSIS — E7800 Pure hypercholesterolemia, unspecified: Secondary | ICD-10-CM | POA: Diagnosis not present

## 2024-07-11 DIAGNOSIS — I420 Dilated cardiomyopathy: Secondary | ICD-10-CM | POA: Diagnosis not present

## 2024-07-11 MED ORDER — LISINOPRIL 2.5 MG PO TABS: ORAL_TABLET | ORAL | 3 refills | Status: AC

## 2024-07-11 NOTE — Patient Instructions (Signed)

## 2025-05-30 ENCOUNTER — Ambulatory Visit
